# Patient Record
Sex: Female | Born: 1970 | Race: Black or African American | Hispanic: No | Smoking: Current every day smoker
Health system: Southern US, Community
[De-identification: ages and names within clinical notes are randomized; demographics above are authoritative.]

## PROBLEM LIST (undated history)

## (undated) DIAGNOSIS — R519 Headache, unspecified: Secondary | ICD-10-CM

## (undated) DIAGNOSIS — F419 Anxiety disorder, unspecified: Secondary | ICD-10-CM

## (undated) DIAGNOSIS — R000 Tachycardia, unspecified: Secondary | ICD-10-CM

## (undated) DIAGNOSIS — J189 Pneumonia, unspecified organism: Secondary | ICD-10-CM

## (undated) DIAGNOSIS — K219 Gastro-esophageal reflux disease without esophagitis: Secondary | ICD-10-CM

## (undated) DIAGNOSIS — M1990 Unspecified osteoarthritis, unspecified site: Secondary | ICD-10-CM

## (undated) DIAGNOSIS — I10 Essential (primary) hypertension: Secondary | ICD-10-CM

## (undated) HISTORY — PX: SKIN BIOPSY: SHX1

---

## 2012-08-05 HISTORY — PX: LIPOMA RESECTION: SHX23

## 2013-06-07 HISTORY — PX: BREAST BIOPSY: SHX20

## 2015-10-03 ENCOUNTER — Emergency Department (HOSPITAL_COMMUNITY)
Admission: EM | Admit: 2015-10-03 | Discharge: 2015-10-03 | Disposition: A | Discharge status: Home / Self Care | Payer: Self-pay | Attending: Emergency Medicine | Admitting: Emergency Medicine

## 2015-10-03 ENCOUNTER — Encounter (HOSPITAL_COMMUNITY): Payer: Self-pay | Admitting: Emergency Medicine

## 2015-10-03 DIAGNOSIS — Z79899 Other long term (current) drug therapy: Secondary | ICD-10-CM | POA: Insufficient documentation

## 2015-10-03 DIAGNOSIS — Z760 Encounter for issue of repeat prescription: Secondary | ICD-10-CM | POA: Insufficient documentation

## 2015-10-03 DIAGNOSIS — Z87891 Personal history of nicotine dependence: Secondary | ICD-10-CM | POA: Insufficient documentation

## 2015-10-03 DIAGNOSIS — Z880 Allergy status to penicillin: Secondary | ICD-10-CM | POA: Insufficient documentation

## 2015-10-03 DIAGNOSIS — R040 Epistaxis: Secondary | ICD-10-CM | POA: Insufficient documentation

## 2015-10-03 DIAGNOSIS — I159 Secondary hypertension, unspecified: Secondary | ICD-10-CM | POA: Insufficient documentation

## 2015-10-03 DIAGNOSIS — Z8739 Personal history of other diseases of the musculoskeletal system and connective tissue: Secondary | ICD-10-CM | POA: Insufficient documentation

## 2015-10-03 HISTORY — DX: Essential (primary) hypertension: I10

## 2015-10-03 HISTORY — DX: Unspecified osteoarthritis, unspecified site: M19.90

## 2015-10-03 MED ORDER — LISINOPRIL 10 MG PO TABS: 10.0000 mg | ORAL_TABLET | Freq: Every day | ORAL | Status: DC

## 2015-10-03 MED ORDER — LISINOPRIL 10 MG PO TABS: 10.0000 mg | ORAL_TABLET | Freq: Two times a day (BID) | ORAL | Status: DC

## 2015-10-03 NOTE — ED Notes (Addendum)
Patient coming from home with c/o of high blood pressure where she checked it at [facility] last night and it was 184/111. Patient needs a refill on her Lisinopril.  Patient and husband are here from [facility].    In the ED, patient is alert and oriented with a BP of 137/86.

## 2015-10-03 NOTE — ED Provider Notes (Signed)
CSN: [id number]     Arrival date & time 10/03/15  0946 History   First MD Initiated Contact with Patient 10/03/15 [phone]     Chief Complaint  Patient presents with  . Hypertension  . Medication Refill     (Consider location/radiation/quality/duration/timing/severity/associated sxs/prior Treatment) HPI Comments: 45 year old female with history of rheumatoid arthritis, hypertension presents for hypertension. The patient reports that she recently moved here and is out of her medications. She is trying to establish with a primary care physician but has been unable to. She is usually on lisinopril 10 mg twice daily. She said last night she felt like her blood pressure was elevated injected [employer] and found it to be 184/111. She then woke up this morning and had had a bloody nose which is typical when her blood pressure is not controlled. She denies chest pain, shortness of breath, back pain, headache, focal neurologic deficits. She reports feeling much better now and is noted to have a currently normal blood pressure.   Past Medical History  Diagnosis Date  . Arthritis     Left knee  . Hypertension    Past Surgical History  Procedure Laterality Date  . Skin biopsy     No family history on file. Social History  Substance Use Topics  . Smoking status: Former Games developer  . Smokeless tobacco: None  . Alcohol Use: No   OB History    No data available     Review of Systems  Constitutional: Negative for fever, chills and fatigue.  HENT: Positive for nosebleeds. Negative for congestion, rhinorrhea, sinus pressure, sneezing and voice change.   Eyes: Negative for visual disturbance.  Respiratory: Negative for cough, chest tightness and wheezing.   Cardiovascular: Negative for chest pain and palpitations.  Gastrointestinal: Negative for vomiting, abdominal pain, diarrhea and constipation.  Genitourinary: Negative for dysuria, urgency and hematuria.  Musculoskeletal: Negative for myalgias and  back pain.  Skin: Negative for rash.  Neurological: Negative for dizziness, weakness and headaches.  Hematological: Does not bruise/bleed easily.      Allergies  Amoxicillin  Home Medications   Prior to Admission medications   Medication Sig Start Date End Date Taking? Authorizing Provider  buPROPion (WELLBUTRIN SR) 150 MG 12 hr tablet Take 150 mg by mouth 2 (two) times daily.   Yes Historical Provider, MD  lisinopril (PRINIVIL,ZESTRIL) 10 MG tablet Take 10 mg by mouth daily.   Yes Historical Provider, MD  omeprazole (PRILOSEC) 20 MG capsule Take 20 mg by mouth daily.   Yes Historical Provider, MD   BP 128/84 mmHg  Pulse 83  Temp(Src) 98 F (36.7 C) (Oral)  Resp 16  Ht 5\' 7"  (1.702 m)  Wt 260 lb (117.935 kg)  BMI 40.71 kg/m2  SpO2 100% Physical Exam  Constitutional: She is oriented to person, place, and time. She appears well-developed and well-nourished. No distress.  HENT:  Head: Normocephalic and atraumatic.  Right Ear: External ear normal.  Left Ear: External ear normal.  Nose: Nose normal.  Mouth/Throat: Oropharynx is clear and moist. No oropharyngeal exudate.  Prominent blood vessels over the septum bilaterally with mildly irritated mucosa without any active bleeding  Eyes: EOM are normal. Pupils are equal, round, and reactive to light.  Neck: Normal range of motion. Neck supple.  Cardiovascular: Normal rate, regular rhythm, normal heart sounds and intact distal pulses.   No murmur heard. Pulmonary/Chest: Effort normal. No respiratory distress. She has no wheezes. She has no rales.  Abdominal: Soft. She exhibits no distension.  There is no tenderness.  Musculoskeletal: Normal range of motion. She exhibits no edema or tenderness.  Neurological: She is alert and oriented to person, place, and time.  Skin: Skin is warm and dry. No rash noted. She is not diaphoretic.  Vitals reviewed.   ED Course  Procedures (including critical care time) Labs Review Labs Reviewed  - No data to display  Imaging Review No results found. I have personally reviewed and evaluated these images and lab results as part of my medical decision-making.   EKG Interpretation None      MDM  Patient was seen and evaluated in stable condition. Pressure controlled at this time. Patient asymptomatic. No epistaxis on examination. Outpatient follow-up with a primary care physician was provided for the patient. She was also given a prescription for a month supply of her lisinopril. Strict return precautions were given. Final diagnoses:  None    1. Hypertension, chronic 2. Medication refill    Leta Baptist, MD 10/03/15 1609

## 2015-10-03 NOTE — Discharge Instructions (Signed)
You were seen and evaluated regarding your high blood pressure and being out of your medications. You have been provided with a prescription for your lisinopril. Take as directed. Follow up outpatient at the primary care office visit that was scheduled for you through care management.  Hypertension Hypertension, commonly called high blood pressure, is when the force of blood pumping through your arteries is too strong. Your arteries are the blood vessels that carry blood from your heart throughout your body. A blood pressure reading consists of a higher number over a lower number, such as 110/72. The higher number (systolic) is the pressure inside your arteries when your heart pumps. The lower number (diastolic) is the pressure inside your arteries when your heart relaxes. Ideally you want your blood pressure below 120/80. Hypertension forces your heart to work harder to pump blood. Your arteries may become narrow or stiff. Having untreated or uncontrolled hypertension can cause heart attack, stroke, kidney disease, and other problems. RISK FACTORS Some risk factors for high blood pressure are controllable. Others are not.  Risk factors you cannot control include:   Race. You may be at higher risk if you are African American.  Age. Risk increases with age.  Gender. Men are at higher risk than women before age 33 years. After age 68, women are at higher risk than men. Risk factors you can control include:  Not getting enough exercise or physical activity.  Being overweight.  Getting too much fat, sugar, calories, or salt in your diet.  Drinking too much alcohol. SIGNS AND SYMPTOMS Hypertension does not usually cause signs or symptoms. Extremely high blood pressure (hypertensive crisis) may cause headache, anxiety, shortness of breath, and nosebleed. DIAGNOSIS To check if you have hypertension, your health care provider will measure your blood pressure while you are seated, with your arm held  at the level of your heart. It should be measured at least twice using the same arm. Certain conditions can cause a difference in blood pressure between your right and left arms. A blood pressure reading that is higher than normal on one occasion does not mean that you need treatment. If it is not clear whether you have high blood pressure, you may be asked to return on a different day to have your blood pressure checked again. Or, you may be asked to monitor your blood pressure at home for 1 or more weeks. TREATMENT Treating high blood pressure includes making lifestyle changes and possibly taking medicine. Living a healthy lifestyle can help lower high blood pressure. You may need to change some of your habits. Lifestyle changes may include:  Following the DASH diet. This diet is high in fruits, vegetables, and whole grains. It is low in salt, red meat, and added sugars.  Keep your sodium intake below 2,300 mg per day.  Getting at least 30-45 minutes of aerobic exercise at least 4 times per week.  Losing weight if necessary.  Not smoking.  Limiting alcoholic beverages.  Learning ways to reduce stress. Your health care provider may prescribe medicine if lifestyle changes are not enough to get your blood pressure under control, and if one of the following is true:  You are 88-9 years of age and your systolic blood pressure is above 140.  You are 70 years of age or older, and your systolic blood pressure is above 150.  Your diastolic blood pressure is above 90.  You have diabetes, and your systolic blood pressure is over 140 or your diastolic blood pressure  is over 90.  You have kidney disease and your blood pressure is above 140/90.  You have heart disease and your blood pressure is above 140/90. Your personal target blood pressure may vary depending on your medical conditions, your age, and other factors. HOME CARE INSTRUCTIONS  Have your blood pressure rechecked as directed by  your health care provider.   Take medicines only as directed by your health care provider. Follow the directions carefully. Blood pressure medicines must be taken as prescribed. The medicine does not work as well when you skip doses. Skipping doses also puts you at risk for problems.  Do not smoke.   Monitor your blood pressure at home as directed by your health care provider. SEEK MEDICAL CARE IF:   You think you are having a reaction to medicines taken.  You have recurrent headaches or feel dizzy.  You have swelling in your ankles.  You have trouble with your vision. SEEK IMMEDIATE MEDICAL CARE IF:  You develop a severe headache or confusion.  You have unusual weakness, numbness, or feel faint.  You have severe chest or abdominal pain.  You vomit repeatedly.  You have trouble breathing. MAKE SURE YOU:   Understand these instructions.  Will watch your condition.  Will get help right away if you are not doing well or get worse.   This information is not intended to replace advice given to you by your health care provider. Make sure you discuss any questions you have with your health care provider.   Document Released: 05/24/2005 Document Revised: 10/08/2014 Document Reviewed: 03/16/2013 Elsevier Interactive Patient Education [employer].

## 2015-10-10 ENCOUNTER — Ambulatory Visit: Payer: Self-pay | Admitting: Family Medicine

## 2015-10-22 ENCOUNTER — Encounter: Payer: Self-pay | Admitting: Family Medicine

## 2015-10-22 ENCOUNTER — Ambulatory Visit (INDEPENDENT_AMBULATORY_CARE_PROVIDER_SITE_OTHER): Payer: 59 | Admitting: Family Medicine

## 2015-10-22 VITALS — BP 134/78 | HR 97 | Temp 98.6°F | Resp 16 | Ht 68.0 in | Wt 274.0 lb

## 2015-10-22 DIAGNOSIS — Z1239 Encounter for other screening for malignant neoplasm of breast: Secondary | ICD-10-CM

## 2015-10-22 DIAGNOSIS — M79602 Pain in left arm: Secondary | ICD-10-CM

## 2015-10-22 DIAGNOSIS — Z23 Encounter for immunization: Secondary | ICD-10-CM | POA: Diagnosis not present

## 2015-10-22 DIAGNOSIS — M069 Rheumatoid arthritis, unspecified: Secondary | ICD-10-CM

## 2015-10-22 DIAGNOSIS — I10 Essential (primary) hypertension: Secondary | ICD-10-CM | POA: Diagnosis not present

## 2015-10-22 DIAGNOSIS — M25512 Pain in left shoulder: Secondary | ICD-10-CM

## 2015-10-22 DIAGNOSIS — K219 Gastro-esophageal reflux disease without esophagitis: Secondary | ICD-10-CM

## 2015-10-22 DIAGNOSIS — F17200 Nicotine dependence, unspecified, uncomplicated: Secondary | ICD-10-CM

## 2015-10-22 LAB — COMPLETE METABOLIC PANEL WITH GFR
ALBUMIN: 3.9 g/dL (ref 3.6–5.1)
ALK PHOS: 101 U/L (ref 33–115)
ALT: 8 U/L (ref 6–29)
AST: 12 U/L (ref 10–30)
BUN: 10 mg/dL (ref 7–25)
CALCIUM: 8.9 mg/dL (ref 8.6–10.2)
CO2: 24 mmol/L (ref 20–31)
Chloride: 104 mmol/L (ref 98–110)
Creat: 0.63 mg/dL (ref 0.50–1.10)
GFR, Est African American: >89 mL/min (ref >=60)
GLUCOSE: 78 mg/dL (ref 65–99)
POTASSIUM: 4.4 mmol/L (ref 3.5–5.3)
SODIUM: 137 mmol/L (ref 135–146)
Total Bilirubin: 0.2 mg/dL (ref 0.2–1.2)
Total Protein: 7.1 g/dL (ref 6.1–8.1)

## 2015-10-22 LAB — LIPID PANEL
CHOL/HDL RATIO: 2.6 ratio (ref <=5.0)
CHOLESTEROL: 148 mg/dL (ref 125–200)
HDL: 58 mg/dL (ref >=46)
LDL Cholesterol: 71 mg/dL (ref <130)
TRIGLYCERIDES: 96 mg/dL (ref <150)
VLDL: 19 mg/dL (ref <30)

## 2015-10-22 LAB — CBC WITH DIFFERENTIAL/PLATELET
Basophils Absolute: 50 cells/uL (ref 0–200)
Basophils Relative: 1 %
EOS PCT: 3 %
Eosinophils Absolute: 150 cells/uL (ref 15–500)
HCT: 39.4 % (ref 35.0–45.0)
Hemoglobin: 12.9 g/dL (ref 11.7–15.5)
LYMPHS PCT: 39 %
Lymphs Abs: 1950 cells/uL (ref 850–3900)
MCH: 29.2 pg (ref 27.0–33.0)
MCHC: 32.7 g/dL (ref 32.0–36.0)
MCV: 89.1 fL (ref 80.0–100.0)
MPV: 8.7 fL (ref 7.5–12.5)
Monocytes Absolute: 400 cells/uL (ref 200–950)
Monocytes Relative: 8 %
NEUTROS PCT: 49 %
Neutro Abs: 2450 cells/uL (ref 1500–7800)
Platelets: 341 10*3/uL (ref 140–400)
RBC: 4.42 MIL/uL (ref 3.80–5.10)
RDW: 14.3 % (ref 11.0–15.0)
WBC: 5 10*3/uL (ref 3.8–10.8)

## 2015-10-22 LAB — C-REACTIVE PROTEIN: CRP: 1.1 mg/dL — ABNORMAL HIGH (ref <0.60)

## 2015-10-22 LAB — RHEUMATOID FACTOR

## 2015-10-22 MED ORDER — BUPROPION HCL ER (SR) 150 MG PO TB12: 150.0000 mg | ORAL_TABLET | Freq: Two times a day (BID) | ORAL | Status: DC

## 2015-10-22 MED ORDER — LISINOPRIL 10 MG PO TABS: 10.0000 mg | ORAL_TABLET | Freq: Two times a day (BID) | ORAL | Status: DC

## 2015-10-22 MED ORDER — OMEPRAZOLE 20 MG PO CPDR: 20.0000 mg | DELAYED_RELEASE_CAPSULE | Freq: Every day | ORAL | Status: DC

## 2015-10-22 NOTE — Progress Notes (Signed)
Subjective:    Patient ID: Jennifer Forbes, female    DOB: Nov 01, 1970, 45 y.o.   MRN: [id number]  HPI  Jennifer Forbes, a 45 year old female with a history of rheumatoid arthritis and hypertension presents to establish care. She recently relocated to area from [place]. She says that she was a patient of Dr. Manley Mason, but has been lost to follow-up. She states that she has been out of anti-hypertensive medications. She was recently evaluated in the emergency department at [facility] after blood pressure was 184/111 in [employer].She is not exercising and is not adherent to low salt diet.  She does not check blood pressures at home. . Patient denies chest pain, dyspnea, fatigue, palpitations, syncope and tachypnea.  Cardiovascular risk factors include: obesity (BMI >= 30 kg/m2) and sedentary lifestyle.  Jennifer Forbes also has a history of rheumatoid arthritis.  Symptoms have been present for several years.  Symptoms include joint pain primarily to knees and are of moderate severity. Patient denies rash, fever, fatigue. Symptoms are made worse by: cold exposure, kneeling, movement, standing and walking.  Symptoms are helped by NSAIDs and heat therapy. She has never been unde the care of a rheumatologist.   Associated symptoms include joint pain and morning stiffness. Patient denies associated muscle weakness, nausea, new headache, nodules, oral ulcers, palpitations, pleurisy, polydypsia, polyuria, rashes/photosensitive, Raynaud's and seizures. She says that she had a fall 1 week ago at work and left knee/left shoulder pain has worsened. She rates pain at 6/10 described as aching and intermittent. She says that she has not been able to return to work due to increased pain and activity limitation.  Past Medical History  Diagnosis Date  . Arthritis     Left knee  . Hypertension    Immunization History  Administered Date(s) Administered  . Pneumococcal Polysaccharide-23 10/22/2015   Past  Surgical History  Procedure Laterality Date  . Skin biopsy    . Lipoma resection  march 2014    right shoulder    Allergies  Allergen Reactions  . Amoxicillin Rash   Social History   Social History  . Marital Status: Married    Spouse Name: N/A  . Number of Children: N/A  . Years of Education: N/A   Occupational History  . Not on file.   Social History Main Topics  . Smoking status: Former Games developer  . Smokeless tobacco: Not on file  . Alcohol Use: No  . Drug Use: No  . Sexual Activity: Not on file   Other Topics Concern  . Not on file   Social History Narrative    Review of Systems  Constitutional: Negative for fever and fatigue.  HENT: Negative.   Eyes: Negative for photophobia and visual disturbance.  Respiratory: Negative.   Cardiovascular: Negative for chest pain, palpitations and leg swelling.  Gastrointestinal: Negative.   Endocrine: Negative for polydipsia, polyphagia and polyuria.  Genitourinary: Negative.   Musculoskeletal: Positive for joint swelling (left knee) and arthralgias (left shoulder and left knee).  Skin: Negative.   Allergic/Immunologic: Negative.   Neurological: Negative.   Hematological: Negative.   Psychiatric/Behavioral: Negative.  Negative for suicidal ideas, behavioral problems, confusion and sleep disturbance.       Objective:   Physical Exam  Constitutional: She appears well-developed and well-nourished.  Morbid obesity  HENT:  Head: Normocephalic and atraumatic.  Right Ear: External ear normal.  Left Ear: External ear normal.  Mouth/Throat: Oropharynx is clear and moist.  Musculoskeletal:  Left shoulder: She exhibits decreased range of motion, tenderness, crepitus, pain and decreased strength (3/5). She exhibits no swelling and no deformity.       Left knee: She exhibits decreased range of motion and swelling. She exhibits no erythema and normal patellar mobility. Tenderness found.      BP 134/78 mmHg  Pulse 97   Temp(Src) 98.6 F (37 C) (Oral)  Resp 16  Ht 5\' 8"  (1.727 m)  Wt 274 lb (124.286 kg)  BMI 41.67 kg/m2  SpO2 100%  LMP 10/19/2015 Assessment & Plan:   1. Essential hypertension Blood pressure is at goal on Lisinopril, will continue at 10 mg. The patient is asked to make an attempt to improve diet and exercise patterns to aid in medical management of this problem. - lisinopril (PRINIVIL,ZESTRIL) 10 MG tablet; Take 1 tablet (10 mg total) by mouth 2 (two) times daily.  Dispense: 60 tablet; Refill: 5 - Urinalysis Dipstick - Lipid Panel  2. Rheumatoid arthritis involving left knee, unspecified rheumatoid factor presence (HCC)  - Sedimentation Rate - C-reactive protein - Rheumatoid factor  3. Left arm pain  - Sedimentation Rate - C-reactive protein - Rheumatoid factor  4. Left shoulder pain  - Sedimentation Rate - C-reactive protein - Rheumatoid factor  5. Morbid obesity, unspecified obesity type (HCC) Recommend a lowfat, low carbohydrate diet divided over 5-6 small meals, increase water intake to 6-8 glasses, and 150 minutes per week of cardiovascular exercise. Given written material pertaining to diet.   - Hemoglobin A1c - COMPLETE METABOLIC PANEL WITH GFR - CBC with Differential  6. Tobacco dependence Smoking cessation instruction/counseling given:  counseled patient on the dangers of tobacco use, advised patient to stop smoking, and reviewed strategies to maximize success - buPROPion (WELLBUTRIN SR) 150 MG 12 hr tablet; Take 1 tablet (150 mg total) by mouth 2 (two) times daily.  Dispense: 60 tablet; Refill: 0  7. Gastroesophageal reflux disease without esophagitis  - omeprazole (PRILOSEC) 20 MG capsule; Take 1 capsule (20 mg total) by mouth daily.  Dispense: 30 capsule; Refill: 2  8. Immunization due  - Pneumococcal polysaccharide vaccine 23-valent greater than or equal to 2yo subcutaneous/IM   Routine Health Maintenance:   Pap smear 1 year ago, normal per  patient Sent referral for screening mammogram Vaccinations up to date    RTC: 1 month for hypertension. Will follow up by phone with laboratory results    The patient was given clear instructions to go to ER or return to medical center if symptoms do not improve, worsen or new problems develop. The patient verbalized understanding. Will notify patient with laboratory results.

## 2015-10-22 NOTE — Patient Instructions (Addendum)
Recommend work-up at urgent care for potential worker's compensation [facility] [address] [place], [place] [phone])  Will follow up by phone with laboratory resultsExercising to Lose Weight Exercising can help you to lose weight. In order to lose weight through exercise, you need to do vigorous-intensity exercise. You can tell that you are exercising with vigorous intensity if you are breathing very hard and fast and cannot hold a conversation while exercising. Moderate-intensity exercise helps to maintain your current weight. You can tell that you are exercising at a moderate level if you have a higher heart rate and faster breathing, but you are still able to hold a conversation. HOW OFTEN SHOULD I EXERCISE? Choose an activity that you enjoy and set realistic goals. Your health care provider can help you to make an activity plan that works for you. Exercise regularly as directed by your health care provider. This may include:  Doing resistance training twice each week, such as:  Push-ups.  Sit-ups.  Lifting weights.  Using resistance bands.  Doing a given intensity of exercise for a given amount of time. Choose from these options:  150 minutes of moderate-intensity exercise every week.  75 minutes of vigorous-intensity exercise every week.  A mix of moderate-intensity and vigorous-intensity exercise every week. Children, pregnant women, people who are out of shape, people who are overweight, and older adults may need to consult a health care provider for individual recommendations. If you have any sort of medical condition, be sure to consult your health care provider before starting a new exercise program. WHAT ARE SOME ACTIVITIES THAT CAN HELP ME TO LOSE WEIGHT?   Walking at a rate of at least 4.5 miles an hour.  Jogging or running at a rate of 5 miles per hour.  Biking at a rate of at least 10 miles per hour.  Lap swimming.  Roller-skating or  in-line skating.  Cross-country skiing.  Vigorous competitive sports, such as football, basketball, and soccer.  Jumping rope.  Aerobic dancing. HOW CAN I BE MORE ACTIVE IN MY DAY-TO-DAY ACTIVITIES?  Use the stairs instead of the elevator.  Take a walk during your lunch break.  If you drive, park your car farther away from work or school.  If you take public transportation, get off one stop early and walk the rest of the way.  Make all of your phone calls while standing up and walking around.  Get up, stretch, and walk around every 30 minutes throughout the day. WHAT GUIDELINES SHOULD I FOLLOW WHILE EXERCISING?  Do not exercise so much that you hurt yourself, feel dizzy, or get very short of breath.  Consult your health care provider prior to starting a new exercise program.  Wear comfortable clothes and shoes with good support.  Drink plenty of water while you exercise to prevent dehydration or heat stroke. Body water is lost during exercise and must be replaced.  Work out until you breathe faster and your heart beats faster.   This information is not intended to replace advice given to you by your health care provider. Make sure you discuss any questions you have with your health care provider.   Document Released: 06/26/2010 Document Revised: 06/14/2014 Document Reviewed: 10/25/2013 Elsevier Interactive Patient Education 2016 Elsevier Inc. DASH Eating Plan DASH stands for "Dietary Approaches to Stop Hypertension." The DASH eating plan is a healthy eating plan that has been shown to reduce high blood pressure (hypertension). Additional health benefits may include reducing the risk of  type 2 diabetes mellitus, heart disease, and stroke. The DASH eating plan may also help with weight loss. WHAT DO I NEED TO KNOW ABOUT THE DASH EATING PLAN? For the DASH eating plan, you will follow these general guidelines:  Choose foods with a percent daily value for sodium of less than 5%  (as listed on the food label).  Use salt-free seasonings or herbs instead of table salt or sea salt.  Check with your health care provider or pharmacist before using salt substitutes.  Eat lower-sodium products, often labeled as "lower sodium" or "no salt added."  Eat fresh foods.  Eat more vegetables, fruits, and low-fat dairy products.  Choose whole grains. Look for the word "whole" as the first word in the ingredient list.  Choose fish and skinless chicken or [place] more often than red meat. Limit fish, poultry, and meat to 6 oz (170 g) each day.  Limit sweets, desserts, sugars, and sugary drinks.  Choose heart-healthy fats.  Limit cheese to 1 oz (28 g) per day.  Eat more home-cooked food and less restaurant, buffet, and fast food.  Limit fried foods.  Cook foods using methods other than frying.  Limit canned vegetables. If you do use them, rinse them well to decrease the sodium.  When eating at a restaurant, ask that your food be prepared with less salt, or no salt if possible. WHAT FOODS CAN I EAT? Seek help from a dietitian for individual calorie needs. Grains Whole grain or whole wheat bread. Brown rice. Whole grain or whole wheat pasta. Quinoa, bulgur, and whole grain cereals. Low-sodium cereals. Corn or whole wheat flour tortillas. Whole grain cornbread. Whole grain crackers. Low-sodium crackers. Vegetables Fresh or frozen vegetables (raw, steamed, roasted, or grilled). Low-sodium or reduced-sodium tomato and vegetable juices. Low-sodium or reduced-sodium tomato sauce and paste. Low-sodium or reduced-sodium canned vegetables.  Fruits All fresh, canned (in natural juice), or frozen fruits. Meat and Other Protein Products Ground beef (85% or leaner), grass-fed beef, or beef trimmed of fat. Skinless chicken or [place]. Ground chicken or [place]. Pork trimmed of fat. All fish and seafood. Eggs. Dried beans, peas, or lentils. Unsalted nuts and seeds. Unsalted canned  beans. Dairy Low-fat dairy products, such as skim or 1% milk, 2% or reduced-fat cheeses, low-fat ricotta or cottage cheese, or plain low-fat yogurt. Low-sodium or reduced-sodium cheeses. Fats and Oils Tub margarines without trans fats. Light or reduced-fat mayonnaise and salad dressings (reduced sodium). Avocado. Safflower, olive, or canola oils. Natural peanut or almond butter. Other Unsalted popcorn and pretzels. The items listed above may not be a complete list of recommended foods or beverages. Contact your dietitian for more options. WHAT FOODS ARE NOT RECOMMENDED? Grains White bread. White pasta. White rice. Refined cornbread. Bagels and croissants. Crackers that contain trans fat. Vegetables Creamed or fried vegetables. Vegetables in a cheese sauce. Regular canned vegetables. Regular canned tomato sauce and paste. Regular tomato and vegetable juices. Fruits Dried fruits. Canned fruit in light or heavy syrup. Fruit juice. Meat and Other Protein Products Fatty cuts of meat. Ribs, chicken wings, bacon, sausage, bologna, salami, chitterlings, fatback, hot dogs, bratwurst, and packaged luncheon meats. Salted nuts and seeds. Canned beans with salt. Dairy Whole or 2% milk, cream, half-and-half, and cream cheese. Whole-fat or sweetened yogurt. Full-fat cheeses or blue cheese. Nondairy creamers and whipped toppings. Processed cheese, cheese spreads, or cheese curds. Condiments Onion and garlic salt, seasoned salt, table salt, and sea salt. Canned and packaged gravies. Worcestershire sauce. Tartar sauce. Barbecue sauce.  Teriyaki sauce. Soy sauce, including reduced sodium. Steak sauce. Fish sauce. Oyster sauce. Cocktail sauce. Horseradish. Ketchup and mustard. Meat flavorings and tenderizers. Bouillon cubes. Hot sauce. Tabasco sauce. Marinades. Taco seasonings. Relishes. Fats and Oils Butter, stick margarine, lard, shortening, ghee, and bacon fat. Coconut, palm kernel, or palm oils. Regular salad  dressings. Other Pickles and olives. Salted popcorn and pretzels. The items listed above may not be a complete list of foods and beverages to avoid. Contact your dietitian for more information. WHERE CAN I FIND MORE INFORMATION? National Heart, Lung, and Blood Institute: [employer]   This information is not intended to replace advice given to you by your health care provider. Make sure you discuss any questions you have with your health care provider.   Document Released: 05/13/2011 Document Revised: 06/14/2014 Document Reviewed: 03/28/2013 Elsevier Interactive Patient Education 2016 [employer]. Food Choices for Gastroesophageal Reflux Disease, Adult When you have gastroesophageal reflux disease (GERD), the foods you eat and your eating habits are very important. Choosing the right foods can help ease the discomfort of GERD. WHAT GENERAL GUIDELINES DO I NEED TO FOLLOW?  Choose fruits, vegetables, whole grains, low-fat dairy products, and low-fat meat, fish, and poultry.  Limit fats such as oils, salad dressings, butter, nuts, and avocado.  Keep a food diary to identify foods that cause symptoms.  Avoid foods that cause reflux. These may be different for different people.  Eat frequent small meals instead of three large meals each day.  Eat your meals slowly, in a relaxed setting.  Limit fried foods.  Cook foods using methods other than frying.  Avoid drinking alcohol.  Avoid drinking large amounts of liquids with your meals.  Avoid bending over or lying down until 2-3 hours after eating. WHAT FOODS ARE NOT RECOMMENDED? The following are some foods and drinks that may worsen your symptoms: Vegetables Tomatoes. Tomato juice. Tomato and spaghetti sauce. Chili peppers. Onion and garlic. Horseradish. Fruits Oranges, grapefruit, and lemon (fruit and juice). Meats High-fat meats, fish, and poultry. This includes hot dogs, ribs, ham,  sausage, salami, and bacon. Dairy Whole milk and chocolate milk. Sour cream. Cream. Butter. Ice cream. Cream cheese.  Beverages Coffee and tea, with or without caffeine. Carbonated beverages or energy drinks. Condiments Hot sauce. Barbecue sauce.  Sweets/Desserts Chocolate and cocoa. Donuts. Peppermint and spearmint. Fats and Oils High-fat foods, including [place] fries and potato chips. Other Vinegar. Strong spices, such as black pepper, white pepper, red pepper, cayenne, curry powder, cloves, ginger, and chili powder. The items listed above may not be a complete list of foods and beverages to avoid. Contact your dietitian for more information.   This information is not intended to replace advice given to you by your health care provider. Make sure you discuss any questions you have with your health care provider.   Document Released: 05/24/2005 Document Revised: 06/14/2014 Document Reviewed: 03/28/2013 Elsevier Interactive Patient Education [employer]

## 2015-10-23 LAB — HEMOGLOBIN A1C
HEMOGLOBIN A1C: 5.4 % (ref <5.7)
Mean Plasma Glucose: 108 mg/dL

## 2015-10-23 LAB — SEDIMENTATION RATE: SED RATE: 15 mm/h (ref 0–20)

## 2015-10-24 ENCOUNTER — Other Ambulatory Visit: Payer: Self-pay | Admitting: Family Medicine

## 2015-10-24 DIAGNOSIS — I10 Essential (primary) hypertension: Secondary | ICD-10-CM | POA: Insufficient documentation

## 2015-10-24 DIAGNOSIS — F17200 Nicotine dependence, unspecified, uncomplicated: Secondary | ICD-10-CM | POA: Insufficient documentation

## 2015-10-24 DIAGNOSIS — Z1231 Encounter for screening mammogram for malignant neoplasm of breast: Secondary | ICD-10-CM

## 2015-10-24 DIAGNOSIS — K219 Gastro-esophageal reflux disease without esophagitis: Secondary | ICD-10-CM | POA: Insufficient documentation

## 2015-10-24 DIAGNOSIS — M069 Rheumatoid arthritis, unspecified: Secondary | ICD-10-CM | POA: Insufficient documentation

## 2015-11-27 ENCOUNTER — Ambulatory Visit
Admission: RE | Admit: 2015-11-27 | Discharge: 2015-11-27 | Disposition: A | Discharge status: Home / Self Care | Payer: No Typology Code available for payment source | Source: Ambulatory Visit | Attending: Family Medicine | Admitting: Family Medicine

## 2015-11-27 DIAGNOSIS — Z1231 Encounter for screening mammogram for malignant neoplasm of breast: Secondary | ICD-10-CM

## 2015-12-01 ENCOUNTER — Other Ambulatory Visit: Payer: Self-pay | Admitting: Family Medicine

## 2015-12-01 DIAGNOSIS — R928 Other abnormal and inconclusive findings on diagnostic imaging of breast: Secondary | ICD-10-CM

## 2015-12-08 ENCOUNTER — Other Ambulatory Visit (HOSPITAL_COMMUNITY): Payer: Self-pay | Admitting: *Deleted

## 2015-12-08 ENCOUNTER — Encounter (HOSPITAL_COMMUNITY): Payer: Self-pay | Admitting: *Deleted

## 2015-12-08 DIAGNOSIS — R928 Other abnormal and inconclusive findings on diagnostic imaging of breast: Secondary | ICD-10-CM

## 2015-12-17 ENCOUNTER — Telehealth (HOSPITAL_COMMUNITY): Payer: Self-pay | Admitting: *Deleted

## 2015-12-17 NOTE — Telephone Encounter (Signed)
Telephoned patient at home # and left message about BCCCP appointment on July 13 2:00

## 2015-12-18 ENCOUNTER — Ambulatory Visit
Admission: RE | Admit: 2015-12-18 | Discharge: 2015-12-18 | Disposition: A | Discharge status: Home / Self Care | Payer: No Typology Code available for payment source | Source: Ambulatory Visit | Attending: Family Medicine | Admitting: Family Medicine

## 2015-12-18 ENCOUNTER — Encounter (HOSPITAL_COMMUNITY): Payer: Self-pay

## 2015-12-18 ENCOUNTER — Ambulatory Visit (HOSPITAL_COMMUNITY)
Admission: RE | Admit: 2015-12-18 | Discharge: 2015-12-18 | Disposition: A | Discharge status: Home / Self Care | Payer: Self-pay | Source: Ambulatory Visit | Attending: Obstetrics and Gynecology | Admitting: Obstetrics and Gynecology

## 2015-12-18 ENCOUNTER — Other Ambulatory Visit (HOSPITAL_COMMUNITY): Payer: Self-pay | Admitting: Obstetrics and Gynecology

## 2015-12-18 VITALS — BP 114/72 | Temp 98.9°F | Ht 68.0 in | Wt 270.8 lb

## 2015-12-18 DIAGNOSIS — Z1239 Encounter for other screening for malignant neoplasm of breast: Secondary | ICD-10-CM

## 2015-12-18 DIAGNOSIS — R928 Other abnormal and inconclusive findings on diagnostic imaging of breast: Secondary | ICD-10-CM

## 2015-12-18 DIAGNOSIS — R921 Mammographic calcification found on diagnostic imaging of breast: Secondary | ICD-10-CM

## 2015-12-18 NOTE — Progress Notes (Signed)
Patient referred to [facility] by the [facility] due to recommending additional imaging of bilateral breasts.Screening mammogram completed 11/27/2015.  Pap Smear:  Pap smear not completed today. Last Pap smear was in June 2016 in [place] and normal per patient. Per patient has a history of an abnormal Pap smear 20 years ago that a repeat Pap smear was completed for follow up that was normal. No Pap smear results are in EPIC.  Physical exam: Breasts Breasts symmetrical. No skin abnormalities bilateral breasts. No nipple retraction bilateral breasts. No nipple discharge bilateral breasts. No lymphadenopathy. No lumps palpated bilateral breasts. No complaints of pain or tenderness on exam. Referred patient to the [facility] for a bilateral diagnostic mammogram and breast ultrasounds per recommendation. Appointment scheduled for Thursday, December 18, 2015 at 1510.       Pelvic/Bimanual No Pap smear completed today since last Pap smear was in June 2016 per patient. Pap smear not indicated per BCCCP guidelines.   Smoking History: Patient has never smoked.  Patient Navigation: Patient education provided. Access to services provided for patient through [facility] program.

## 2015-12-18 NOTE — Patient Instructions (Signed)
Educational materials on self breast awareness given. Explained to Jennifer Forbes that she did not need a Pap smear today due to last Pap smear was in June 2016 per patient. Let her know [facility] will cover Pap smears every 3 years unless has a history of abnormal Pap smears. Referred patient to the [facility] for a bilateral diagnostic mammogram and breast ultrasounds per recommendation. Appointment scheduled for Thursday, December 18, 2015 at 1510.  Jennifer Forbes verbalized understanding.  Dannya Pitkin, Kathaleen Maser, RN 2:19 PM

## 2015-12-19 ENCOUNTER — Other Ambulatory Visit: Payer: Self-pay

## 2015-12-19 ENCOUNTER — Encounter (HOSPITAL_COMMUNITY): Payer: Self-pay | Admitting: *Deleted

## 2016-01-29 ENCOUNTER — Other Ambulatory Visit: Payer: Self-pay

## 2016-01-29 DIAGNOSIS — I10 Essential (primary) hypertension: Secondary | ICD-10-CM

## 2016-01-29 MED ORDER — LISINOPRIL 10 MG PO TABS: 10.0000 mg | ORAL_TABLET | Freq: Two times a day (BID) | ORAL | 1 refills | Status: DC

## 2016-01-29 NOTE — Telephone Encounter (Signed)
Refill for lisinopril has been sent in and visit has been scheduled. Thanks!

## 2016-03-02 ENCOUNTER — Ambulatory Visit (INDEPENDENT_AMBULATORY_CARE_PROVIDER_SITE_OTHER): Payer: Self-pay | Admitting: Family Medicine

## 2016-03-02 ENCOUNTER — Encounter: Payer: Self-pay | Admitting: Family Medicine

## 2016-03-02 VITALS — BP 140/107 | HR 92 | Temp 98.4°F | Resp 18 | Ht 68.0 in | Wt 268.0 lb

## 2016-03-02 DIAGNOSIS — J069 Acute upper respiratory infection, unspecified: Secondary | ICD-10-CM

## 2016-03-02 MED ORDER — GUAIFENESIN-CODEINE 100-10 MG/5ML PO SOLN: 5.0000 mL | Freq: Three times a day (TID) | ORAL | 0 refills | Status: DC | PRN

## 2016-03-02 NOTE — Progress Notes (Signed)
Patient is here for HTN FU  Patient has not taken medication today and patient has not eaten today.  Patient complains of arthritic pain increasing in the left knee. Patient complains of a knot being present for the past 3 days.  Patient complains of cold symptoms being present since Sunday. Patient has taken alka-selser and has been provided minimal relief.

## 2016-03-02 NOTE — Progress Notes (Signed)
Jennifer Forbes, is a 45 y.o. female  IRW:[id number]  PYP:[id number]  DOB - 11-26-1970  CC:  Chief Complaint  Patient presents with  . Hypertension       HPI: Jennifer Forbes is a 45 y.o. female here for follow-up hypertension. However, she has not taken her BP medication medication today. We are turning visit into sick visit and she will return for BP check next week,having taken her BP med.  Her complaint today is of cold symptoms for several days. Her husband had similar symptoms and is improving. She complains mostly of nasal congestion and cough. She reports having difficulty sleeping due to her stuffed up nose. She also mentions her chronic knee pain. She declines immunizations today. She reports smoking 2-3 cigarettes a day.  Allergies  Allergen Reactions  . Amoxicillin Rash   Past Medical History:  Diagnosis Date  . Arthritis    Left knee  . Hypertension    Current Outpatient Prescriptions on File Prior to Visit  Medication Sig Dispense Refill  . buPROPion (WELLBUTRIN SR) 150 MG 12 hr tablet Take 1 tablet (150 mg total) by mouth 2 (two) times daily. 60 tablet 0  . lisinopril (PRINIVIL,ZESTRIL) 10 MG tablet Take 1 tablet (10 mg total) by mouth 2 (two) times daily. 60 tablet 1  . omeprazole (PRILOSEC) 20 MG capsule Take 1 capsule (20 mg total) by mouth daily. 30 capsule 2   No current facility-administered medications on file prior to visit.    Family History  Problem Relation Age of Onset  . Hypertension Mother   . Breast cancer Sister   . Diabetes Brother   . Breast cancer Maternal Aunt   . Diabetes Maternal Uncle    Social History   Social History  . Marital status: Married    Spouse name: N/A  . Number of children: N/A  . Years of education: N/A   Occupational History  . Not on file.   Social History Main Topics  . Smoking status: Former Games developer  . Smokeless tobacco: Not on file  . Alcohol use No  . Drug use: No  . Sexual activity: Yes    Birth  control/ protection: None   Other Topics Concern  . Not on file   Social History Narrative  . No narrative on file    Review of Systems: Constitutional: Positive for fatigue Skin: Negative HENT: Positive for nasal congestion Eyes: Negative  Neck: Negative Respiratory: Positive for cough Cardiovascular: Negative Gastrointestinal: Negative Genitourinary: Negative  Musculoskeletal: Positive for knee pain  Neurological: Negative for Hematological: Negative  Psychiatric/Behavioral: Negative    Objective:   Vitals:   03/02/16 0955  BP: (!) 140/107  Pulse: 92  Resp: 18  Temp: 98.4 F (36.9 C)    Physical Exam: Constitutional: Patient appears well-developed and well-nourished. No distress. HENT: Normocephalic, atraumatic, External right and left ear normal. Oropharynx is clear and moist. Nasal passages inflammed Eyes: Conjunctivae and EOM are normal. PERRLA, no scleral icterus. Neck: Normal ROM. Neck supple. No lymphadenopathy, No thyromegaly. CVS: RRR, S1/S2 +, no murmurs, no gallops, no rubs Pulmonary: Effort and breath sounds normal, no stridor, rhonchi, wheezes, rales.  Abdominal: Soft. Normoactive BS,, no distension, tenderness, rebound or guarding.  Musculoskeletal: Normal range of motion. No edema and no tenderness.  Neuro: Alert.Normal muscle tone coordination. Non-focal Skin: Skin is warm and dry. No rash noted. Not diaphoretic. No erythema. No pallor. Psychiatric: Normal mood and affect. Behavior, judgment, thought content normal.  Lab Results  Component Value  Date   WBC 5.0 10/22/2015   HGB 12.9 10/22/2015   HCT 39.4 10/22/2015   MCV 89.1 10/22/2015   PLT 341 10/22/2015   Lab Results  Component Value Date   CREATININE 0.63 10/22/2015   BUN 10 10/22/2015   NA 137 10/22/2015   K 4.4 10/22/2015   CL 104 10/22/2015   CO2 24 10/22/2015    Lab Results  Component Value Date   HGBA1C 5.4 10/22/2015   Lipid Panel     Component Value Date/Time   CHOL  148 10/22/2015 1031   TRIG 96 10/22/2015 1031   HDL 58 10/22/2015 1031   CHOLHDL 2.6 10/22/2015 1031   VLDL 19 10/22/2015 1031   LDLCALC 71 10/22/2015 1031       Assessment and plan:   1. Acute upper respiratory infection -guaifenesin with codiene, 120 cc, 1 tsp tid cough -Advised may use afrin nasal spray for no more than 5 days, twice a day    Return in about 1 week (around 03/09/2016) for Nurse visit for BP check..  The patient was given clear instructions to go to ER or return to medical center if symptoms don't improve, worsen or new problems develop. The patient verbalized understanding.    Henrietta Hoover FNP  03/02/2016, 11:16 AM

## 2016-03-02 NOTE — Patient Instructions (Signed)
Take your BP medication regularly and return in one week for BP check with nurse] Careful of salt in diet May use Afrin nasal spray for nasal stuffiness.

## 2016-03-08 ENCOUNTER — Other Ambulatory Visit: Payer: Self-pay

## 2016-03-08 DIAGNOSIS — I10 Essential (primary) hypertension: Secondary | ICD-10-CM

## 2016-03-08 MED ORDER — LISINOPRIL 10 MG PO TABS: 10.0000 mg | ORAL_TABLET | Freq: Two times a day (BID) | ORAL | 1 refills | Status: DC

## 2016-03-09 ENCOUNTER — Ambulatory Visit (INDEPENDENT_AMBULATORY_CARE_PROVIDER_SITE_OTHER): Payer: Self-pay | Admitting: *Deleted

## 2016-03-09 VITALS — BP 139/73 | HR 89 | Temp 98.4°F | Resp 18

## 2016-03-09 DIAGNOSIS — I10 Essential (primary) hypertension: Secondary | ICD-10-CM

## 2016-03-09 NOTE — Progress Notes (Signed)
Patient is here for BP check  Patient has taken medication today and patient has eaten today.

## 2016-03-09 NOTE — Patient Instructions (Signed)
Patient advised to continue with current medication.

## 2016-05-25 ENCOUNTER — Emergency Department (HOSPITAL_COMMUNITY)
Admission: EM | Admit: 2016-05-25 | Discharge: 2016-05-25 | Disposition: A | Discharge status: Home / Self Care | Payer: No Typology Code available for payment source | Attending: Emergency Medicine | Admitting: Emergency Medicine

## 2016-05-25 ENCOUNTER — Encounter (HOSPITAL_COMMUNITY): Payer: Self-pay

## 2016-05-25 DIAGNOSIS — Y939 Activity, unspecified: Secondary | ICD-10-CM | POA: Insufficient documentation

## 2016-05-25 DIAGNOSIS — Z87891 Personal history of nicotine dependence: Secondary | ICD-10-CM | POA: Insufficient documentation

## 2016-05-25 DIAGNOSIS — Y92410 Unspecified street and highway as the place of occurrence of the external cause: Secondary | ICD-10-CM | POA: Insufficient documentation

## 2016-05-25 DIAGNOSIS — M546 Pain in thoracic spine: Secondary | ICD-10-CM | POA: Insufficient documentation

## 2016-05-25 DIAGNOSIS — Z79899 Other long term (current) drug therapy: Secondary | ICD-10-CM | POA: Insufficient documentation

## 2016-05-25 DIAGNOSIS — Y999 Unspecified external cause status: Secondary | ICD-10-CM | POA: Insufficient documentation

## 2016-05-25 DIAGNOSIS — I10 Essential (primary) hypertension: Secondary | ICD-10-CM | POA: Insufficient documentation

## 2016-05-25 DIAGNOSIS — S161XXA Strain of muscle, fascia and tendon at neck level, initial encounter: Secondary | ICD-10-CM | POA: Insufficient documentation

## 2016-05-25 MED ORDER — KETOROLAC TROMETHAMINE 15 MG/ML IJ SOLN
15.0000 mg | Freq: Once | INTRAMUSCULAR | Status: AC
Start: 2016-05-25 — End: 2016-05-25
  Administered 2016-05-25: 15 mg via INTRAMUSCULAR
  Filled 2016-05-25: qty 1

## 2016-05-25 MED ORDER — IBUPROFEN 600 MG PO TABS: 600.0000 mg | ORAL_TABLET | Freq: Four times a day (QID) | ORAL | 0 refills | Status: DC | PRN

## 2016-05-25 MED ORDER — IBUPROFEN 400 MG PO TABS: 600.0000 mg | ORAL_TABLET | Freq: Once | ORAL | Status: DC

## 2016-05-25 MED ORDER — METHOCARBAMOL 750 MG PO TABS: 750.0000 mg | ORAL_TABLET | Freq: Four times a day (QID) | ORAL | 0 refills | Status: DC | PRN
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
## 2016-05-25 NOTE — Discharge Instructions (Signed)
Return to ER if you experience fevers,chills,dizziness, vision or gait changes, headaches, chest pain, shortness of breath, abdominal pain, nausea/vomiting, bladder or bowel incontinence, numbness to groin, extremity numbness/tingling/weakness, worsening symptoms,or any additional concerns. Call to schedule a follow up appointment with your primary care doctor. Take Ibuprofen and Robaxin as prescribed.Take Ibuprofen with food to prevent GI upset. Caution Robaxin may cause sedation--do not drink alcohol, drive, or operate machinery while taking.
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
## 2016-05-30 ENCOUNTER — Other Ambulatory Visit: Payer: Self-pay | Admitting: Family Medicine

## 2016-05-30 DIAGNOSIS — I10 Essential (primary) hypertension: Secondary | ICD-10-CM

## 2016-06-14 ENCOUNTER — Encounter (HOSPITAL_COMMUNITY): Payer: Self-pay | Admitting: Emergency Medicine

## 2016-06-14 DIAGNOSIS — S39012A Strain of muscle, fascia and tendon of lower back, initial encounter: Secondary | ICD-10-CM | POA: Diagnosis not present

## 2016-06-14 DIAGNOSIS — Y939 Activity, unspecified: Secondary | ICD-10-CM | POA: Diagnosis not present

## 2016-06-14 DIAGNOSIS — N939 Abnormal uterine and vaginal bleeding, unspecified: Secondary | ICD-10-CM | POA: Insufficient documentation

## 2016-06-14 DIAGNOSIS — I10 Essential (primary) hypertension: Secondary | ICD-10-CM | POA: Insufficient documentation

## 2016-06-14 DIAGNOSIS — S3992XA Unspecified injury of lower back, initial encounter: Secondary | ICD-10-CM | POA: Diagnosis present

## 2016-06-14 DIAGNOSIS — N393 Stress incontinence (female) (male): Secondary | ICD-10-CM | POA: Insufficient documentation

## 2016-06-14 DIAGNOSIS — Z87891 Personal history of nicotine dependence: Secondary | ICD-10-CM | POA: Insufficient documentation

## 2016-06-14 DIAGNOSIS — Y999 Unspecified external cause status: Secondary | ICD-10-CM | POA: Insufficient documentation

## 2016-06-14 DIAGNOSIS — Y92410 Unspecified street and highway as the place of occurrence of the external cause: Secondary | ICD-10-CM | POA: Insufficient documentation

## 2016-06-14 DIAGNOSIS — Z7982 Long term (current) use of aspirin: Secondary | ICD-10-CM | POA: Diagnosis not present

## 2016-06-14 LAB — CBC WITH DIFFERENTIAL/PLATELET
BASOS ABS: 0 10*3/uL (ref 0.0–0.1)
Basophils Relative: 1 %
Eosinophils Absolute: 0.2 10*3/uL (ref 0.0–0.7)
Eosinophils Relative: 3 %
HCT: 39.2 % (ref 36.0–46.0)
Hemoglobin: 12.7 g/dL (ref 12.0–15.0)
LYMPHS PCT: 50 %
Lymphs Abs: 2.9 10*3/uL (ref 0.7–4.0)
MCH: 28.9 pg (ref 26.0–34.0)
MCHC: 32.4 g/dL (ref 30.0–36.0)
MCV: 89.1 fL (ref 78.0–100.0)
MONO ABS: 0.5 10*3/uL (ref 0.1–1.0)
Monocytes Relative: 8 %
Neutro Abs: 2.1 10*3/uL (ref 1.7–7.7)
Neutrophils Relative %: 38 %
Platelets: 358 10*3/uL (ref 150–400)
RBC: 4.4 MIL/uL (ref 3.87–5.11)
RDW: 14.9 % (ref 11.5–15.5)
WBC: 5.7 10*3/uL (ref 4.0–10.5)

## 2016-06-14 LAB — URINALYSIS, ROUTINE W REFLEX MICROSCOPIC
BILIRUBIN URINE: NEGATIVE
Bacteria, UA: NONE SEEN
Glucose, UA: NEGATIVE mg/dL
Ketones, ur: 5 mg/dL — AB
LEUKOCYTES UA: NEGATIVE
NITRITE: NEGATIVE
Protein, ur: NEGATIVE mg/dL
SPECIFIC GRAVITY, URINE: 1.032 — AB (ref 1.005–1.030)
pH: 5 (ref 5.0–8.0)

## 2016-06-14 LAB — COMPREHENSIVE METABOLIC PANEL
ALT: 10 U/L — ABNORMAL LOW (ref 14–54)
AST: 14 U/L — AB (ref 15–41)
Albumin: 3.5 g/dL (ref 3.5–5.0)
Alkaline Phosphatase: 78 U/L (ref 38–126)
Anion gap: 7 (ref 5–15)
BUN: 12 mg/dL (ref 6–20)
CHLORIDE: 107 mmol/L (ref 101–111)
CO2: 24 mmol/L (ref 22–32)
Calcium: 9.1 mg/dL (ref 8.9–10.3)
Creatinine, Ser: 0.76 mg/dL (ref 0.44–1.00)
Glucose, Bld: 84 mg/dL (ref 65–99)
POTASSIUM: 4 mmol/L (ref 3.5–5.1)
Sodium: 138 mmol/L (ref 135–145)
Total Bilirubin: 0.2 mg/dL — ABNORMAL LOW (ref 0.3–1.2)
Total Protein: 6.9 g/dL (ref 6.5–8.1)

## 2016-06-14 LAB — SAMPLE TO BLOOD BANK

## 2016-06-14 LAB — I-STAT BETA HCG BLOOD, ED (MC, WL, AP ONLY)
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
## 2016-06-15 ENCOUNTER — Emergency Department (HOSPITAL_COMMUNITY): Payer: No Typology Code available for payment source

## 2016-06-15 ENCOUNTER — Emergency Department (HOSPITAL_COMMUNITY)
Admission: EM | Admit: 2016-06-15 | Discharge: 2016-06-15 | Disposition: A | Discharge status: Home / Self Care | Payer: No Typology Code available for payment source | Attending: Emergency Medicine | Admitting: Emergency Medicine

## 2016-06-15 ENCOUNTER — Encounter (HOSPITAL_COMMUNITY): Payer: Self-pay | Admitting: *Deleted

## 2016-06-15 DIAGNOSIS — S39012A Strain of muscle, fascia and tendon of lower back, initial encounter: Secondary | ICD-10-CM

## 2016-06-15 DIAGNOSIS — N393 Stress incontinence (female) (male): Secondary | ICD-10-CM

## 2016-06-15 DIAGNOSIS — N939 Abnormal uterine and vaginal bleeding, unspecified: Secondary | ICD-10-CM

## 2016-06-15 DIAGNOSIS — R32 Unspecified urinary incontinence: Secondary | ICD-10-CM

## 2016-06-15 MED ORDER — HYDROCODONE-ACETAMINOPHEN 5-325 MG PO TABS: ORAL_TABLET | ORAL | 0 refills | Status: DC

## 2016-06-15 MED ORDER — OXYCODONE-ACETAMINOPHEN 5-325 MG PO TABS
1.0000 | ORAL_TABLET | Freq: Once | ORAL | Status: AC
  Administered 2016-06-15: 1 via ORAL
  Filled 2016-06-15: qty 1

## 2016-06-15 NOTE — ED Notes (Signed)
Patient transported to MRI 

## 2016-06-15 NOTE — ED Provider Notes (Signed)
PROGRESS NOTE                                                                                                                 This is a sign-out from PA upstillat shift change: Jennifer Forbes is a 46 y.o. female presenting with vaginal bleeding, pelvic exam unremarkable. No signs of infection. Patient is also reporting worsening urinary incontinence, formally stress incontinence but now she has to wear diapers. Given the history of lumbar radicular pain and incontinence, MRI is ordered. Please refer to previous note for full HPI, ROS, PMH and PE.   MRI with no significant abnormality. Reviewed the read with attending. Patient states pain is improved with Percocet. Updated patient, advised her she will need to follow with OB/GYN for both the stress incontinence and vaginal bleeding, [facility]. She has an appointment with her primary care physician within the week, advised her to please update the PCP that she has had multiple ED visits since the last check in. Short course of Vicodin given.    Wynetta Emery, PA-C 06/15/16 0786    Gilda Crease, MD 06/15/16 [phone]

## 2016-06-15 NOTE — Discharge Instructions (Signed)
For pain control please take ibuprofen (also known as Motrin or Advil) 800mg (this is normally 4 over the counter pills) 3 times a day  for 5 days. Take with food to minimize stomach irritation. ° °Take vicodin for breakthrough pain, do not drink alcohol, drive, care for children or do other critical tasks while taking vicodin. ° °Please follow with your primary care doctor in the next 2 days for a check-up. They must obtain records for further management.  ° °Do not hesitate to return to the Emergency Department for any new, worsening or concerning symptoms.  ° ° °
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
## 2016-06-22 ENCOUNTER — Encounter: Payer: Self-pay | Admitting: Family Medicine

## 2016-06-22 ENCOUNTER — Ambulatory Visit (INDEPENDENT_AMBULATORY_CARE_PROVIDER_SITE_OTHER): Payer: Self-pay | Admitting: Family Medicine

## 2016-06-22 VITALS — BP 140/82 | HR 89 | Temp 98.2°F | Resp 16 | Ht 68.0 in | Wt 270.0 lb

## 2016-06-22 DIAGNOSIS — Z23 Encounter for immunization: Secondary | ICD-10-CM

## 2016-06-22 DIAGNOSIS — I10 Essential (primary) hypertension: Secondary | ICD-10-CM

## 2016-06-22 DIAGNOSIS — M5442 Lumbago with sciatica, left side: Secondary | ICD-10-CM

## 2016-06-22 DIAGNOSIS — S3992XD Unspecified injury of lower back, subsequent encounter: Secondary | ICD-10-CM

## 2016-06-22 DIAGNOSIS — F17200 Nicotine dependence, unspecified, uncomplicated: Secondary | ICD-10-CM

## 2016-06-22 LAB — POCT URINALYSIS DIP (DEVICE)
BILIRUBIN URINE: NEGATIVE
GLUCOSE, UA: NEGATIVE mg/dL
Hgb urine dipstick: NEGATIVE
KETONES UR: NEGATIVE mg/dL
Leukocytes, UA: NEGATIVE
Nitrite: NEGATIVE
PROTEIN: NEGATIVE mg/dL
SPECIFIC GRAVITY, URINE: 1.025 (ref 1.005–1.030)
Urobilinogen, UA: 0.2 mg/dL (ref 0.0–1.0)
pH: 6 (ref 5.0–8.0)

## 2016-06-22 MED ORDER — KETOROLAC TROMETHAMINE 60 MG/2ML IM SOLN
60.0000 mg | Freq: Once | INTRAMUSCULAR | Status: AC
  Administered 2016-06-22: 60 mg via INTRAMUSCULAR

## 2016-06-22 MED ORDER — GABAPENTIN 300 MG PO CAPS: 300.0000 mg | ORAL_CAPSULE | Freq: Three times a day (TID) | ORAL | 1 refills | Status: DC
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
## 2016-06-23 MED ORDER — LISINOPRIL 20 MG PO TABS: 20.0000 mg | ORAL_TABLET | Freq: Every day | ORAL | 5 refills | Status: DC

## 2016-06-28 ENCOUNTER — Telehealth: Payer: Self-pay

## 2016-06-28 NOTE — Telephone Encounter (Signed)
Called and explained referral process and advised that we are working on referral. Thanks!

## 2016-07-23 ENCOUNTER — Ambulatory Visit: Payer: No Typology Code available for payment source | Admitting: Family Medicine

## 2016-08-14 ENCOUNTER — Other Ambulatory Visit: Payer: Self-pay | Admitting: Family Medicine

## 2016-09-10 ENCOUNTER — Other Ambulatory Visit: Payer: Self-pay

## 2016-09-10 MED ORDER — LISINOPRIL 10 MG PO TABS: 10.0000 mg | ORAL_TABLET | Freq: Two times a day (BID) | ORAL | 1 refills | Status: DC

## 2016-11-02 ENCOUNTER — Other Ambulatory Visit: Payer: Self-pay | Admitting: Family Medicine

## 2016-11-02 DIAGNOSIS — Z1231 Encounter for screening mammogram for malignant neoplasm of breast: Secondary | ICD-10-CM

## 2016-11-11 ENCOUNTER — Ambulatory Visit: Payer: No Typology Code available for payment source | Admitting: Family Medicine

## 2016-11-30 ENCOUNTER — Ambulatory Visit: Payer: No Typology Code available for payment source

## 2017-02-10 ENCOUNTER — Ambulatory Visit: Payer: No Typology Code available for payment source

## 2017-03-22 ENCOUNTER — Other Ambulatory Visit: Payer: Self-pay | Admitting: Family Medicine

## 2017-03-23 ENCOUNTER — Other Ambulatory Visit: Payer: Self-pay | Admitting: Family Medicine

## 2017-03-23 DIAGNOSIS — M5442 Lumbago with sciatica, left side: Secondary | ICD-10-CM

## 2017-03-28 ENCOUNTER — Other Ambulatory Visit: Payer: Self-pay | Admitting: Family Medicine

## 2017-04-30 ENCOUNTER — Other Ambulatory Visit: Payer: Self-pay | Admitting: Family Medicine

## 2017-04-30 DIAGNOSIS — I10 Essential (primary) hypertension: Secondary | ICD-10-CM

## 2017-05-06 ENCOUNTER — Ambulatory Visit (INDEPENDENT_AMBULATORY_CARE_PROVIDER_SITE_OTHER): Payer: Self-pay | Admitting: Family Medicine

## 2017-05-06 ENCOUNTER — Encounter: Payer: Self-pay | Admitting: Family Medicine

## 2017-05-06 VITALS — BP 131/94 | HR 108 | Temp 98.3°F | Resp 16 | Ht 69.0 in | Wt 269.0 lb

## 2017-05-06 DIAGNOSIS — Z131 Encounter for screening for diabetes mellitus: Secondary | ICD-10-CM

## 2017-05-06 DIAGNOSIS — I10 Essential (primary) hypertension: Secondary | ICD-10-CM

## 2017-05-06 DIAGNOSIS — R42 Dizziness and giddiness: Secondary | ICD-10-CM

## 2017-05-06 DIAGNOSIS — R9431 Abnormal electrocardiogram [ECG] [EKG]: Secondary | ICD-10-CM

## 2017-05-06 DIAGNOSIS — R000 Tachycardia, unspecified: Secondary | ICD-10-CM

## 2017-05-06 LAB — COMPLETE METABOLIC PANEL WITH GFR
AG Ratio: 1.2 (calc) (ref 1.0–2.5)
ALBUMIN MSPROF: 4.1 g/dL (ref 3.6–5.1)
ALKALINE PHOSPHATASE (APISO): 109 U/L (ref 33–115)
ALT: 8 U/L (ref 6–29)
AST: 13 U/L (ref 10–30)
BILIRUBIN TOTAL: 0.2 mg/dL (ref 0.2–1.2)
BUN: 19 mg/dL (ref 7–25)
CHLORIDE: 103 mmol/L (ref 98–110)
CO2: 26 mmol/L (ref 20–32)
Calcium: 9.7 mg/dL (ref 8.6–10.2)
Creat: 0.62 mg/dL (ref 0.50–1.10)
GFR, Est African American: 127 mL/min/{1.73_m2} (ref >=60)
GFR, Est Non African American: 110 mL/min/{1.73_m2} (ref >=60)
GLUCOSE: 82 mg/dL (ref 65–99)
Globulin: 3.5 g/dL (calc) (ref 1.9–3.7)
Potassium: 4.7 mmol/L (ref 3.5–5.3)
SODIUM: 137 mmol/L (ref 135–146)
Total Protein: 7.6 g/dL (ref 6.1–8.1)

## 2017-05-06 LAB — POCT URINALYSIS DIP (DEVICE)
Glucose, UA: NEGATIVE mg/dL
Hgb urine dipstick: NEGATIVE
Leukocytes, UA: NEGATIVE
NITRITE: NEGATIVE
PH: 5.5 (ref 5.0–8.0)
PROTEIN: 30 mg/dL — AB
Specific Gravity, Urine: 1.025 (ref 1.005–1.030)
UROBILINOGEN UA: 1 mg/dL (ref 0.0–1.0)

## 2017-05-06 LAB — POCT GLYCOSYLATED HEMOGLOBIN (HGB A1C): HEMOGLOBIN A1C: 5.3

## 2017-05-06 MED ORDER — AMLODIPINE BESYLATE 5 MG PO TABS: 5.0000 mg | ORAL_TABLET | Freq: Every day | ORAL | 1 refills | Status: DC

## 2017-05-06 NOTE — Progress Notes (Signed)
ba1c

## 2017-05-06 NOTE — Progress Notes (Signed)
Subjective:    Patient ID: Jennifer Forbes, female    DOB: 10/25/1970, 46 y.o.   MRN: [id number]  HPI  Jennifer Forbes, a 46 year old female with a history of hypertension and morbid obesity presents for a follow up of hypertension. Patient states that she has been out of medication over the past week. Patient was previously taking Lisinopril 10 mg consistently. Patient reports that she has been under a great deal of stress over the past several months . She says that she recently received custody of her twin granddaughters. She has not been following a lowfat, low salt diet or exercising routinely. Cardiac risk factors include obesity and tobacco dependence. Current body mass index is 39.72.  Patient smokes 1 pack of cigarettes per day, she has attempted to quit in the past without success.  Patient endorses dizziness.  She denies shortness of breath, chest pain, heart palpitations, syncope, or bilateral lower extremity edema. Past Medical History:  Diagnosis Date  . Arthritis    Left knee  . Hypertension    Social History   Socioeconomic History  . Marital status: Married    Spouse name: Not on file  . Number of children: Not on file  . Years of education: Not on file  . Highest education level: Not on file  Social Needs  . Financial resource strain: Not on file  . Food insecurity - worry: Not on file  . Food insecurity - inability: Not on file  . Transportation needs - medical: Not on file  . Transportation needs - non-medical: Not on file  Occupational History  . Not on file  Tobacco Use  . Smoking status: Current Every Day Smoker    Packs/day: 0.25    Types: Cigars  . Smokeless tobacco: Never Used  . Tobacco comment: black and mild   Substance and Sexual Activity  . Alcohol use: No  . Drug use: Yes    Types: Marijuana  . Sexual activity: Yes    Birth control/protection: None  Other Topics Concern  . Not on file  Social History Narrative  . Not on file   Immunization  History  Administered Date(s) Administered  . Pneumococcal Polysaccharide-23 10/22/2015  . Tdap 06/22/2016      Review of Systems  Constitutional: Positive for fatigue and unexpected weight change (weight gain).  HENT: Negative.   Respiratory: Negative.   Cardiovascular: Negative for chest pain, palpitations and leg swelling.  Gastrointestinal: Negative.   Endocrine: Negative.   Genitourinary: Negative.   Musculoskeletal: Negative.   Skin: Negative.   Allergic/Immunologic: Negative.   Neurological: Negative.   Hematological: Negative.   Psychiatric/Behavioral: Negative.        Objective:   Physical Exam  Constitutional: She is oriented to person, place, and time. She appears well-developed and well-nourished.  HENT:  Head: Normocephalic and atraumatic.  Right Ear: External ear normal.  Left Ear: External ear normal.  Nose: Nose normal.  Mouth/Throat: Oropharynx is clear and moist.  Eyes: Pupils are equal, round, and reactive to light.  Neck: Normal range of motion. Neck supple.  Cardiovascular: Regular rhythm, normal heart sounds and intact distal pulses. Tachycardia present.  Pulses:      Carotid pulses are 2+ on the right side, and 2+ on the left side.      Radial pulses are 2+ on the right side, and 2+ on the left side.       Femoral pulses are 2+ on the right side, and 2+ on  the left side.      Popliteal pulses are 2+ on the right side, and 2+ on the left side.       Dorsalis pedis pulses are 2+ on the right side, and 2+ on the left side.       Posterior tibial pulses are 2+ on the right side, and 2+ on the left side.  Pulmonary/Chest: Effort normal and breath sounds normal.  Abdominal: Soft. Bowel sounds are normal.  Abdominal obesity  Neurological: She is alert and oriented to person, place, and time.  Skin: Skin is warm and dry.  Psychiatric: She has a normal mood and affect. Her behavior is normal. Judgment and thought content normal.       BP (!) 131/94  (BP Location: Right Arm, Patient Position: Sitting, Cuff Size: Large)   Pulse (!) 108   Temp 98.3 F (36.8 C) (Oral)   Resp 16   Ht 5\' 9"  (1.753 m)   Wt 269 lb (122 kg)   LMP 04/21/2017   SpO2 98%   BMI 39.72 kg/m  Assessment & Plan:  1. Essential hypertension Will start a trial of Amlodipine 5 mg daily. Will discontinue Lisinopril. - Continue medication, monitor blood pressure at home. Continue DASH diet. Reminder to go to the ER if any CP, SOB, nausea, dizziness, severe HA, changes vision/speech, left arm numbness and tingling and jaw pain.    - Orthostatic vital signs - COMPLETE METABOLIC PANEL WITH GFR - amLODipine (NORVASC) 5 MG tablet; Take 1 tablet (5 mg total) by mouth daily.  Dispense: 30 tablet; Refill: 1 - HgB A1c - POCT urinalysis dip (device)  2. Dizziness Reviewed orthostatic vital signs, pulse increased on standing.  - EKG 12-Lead  3. Morbid obesity (HCC) Recommend a lowfat, low carbohydrate diet divided over 5-6 small meals, increase water intake to 6-8 glasses, and 150 minutes per week of cardiovascular exercise.   - HgB A1c  4. Tachycardia Reviewed EKG, sinus tachycardia  5. Screening for diabetes mellitus - HgB A1c  6. Abnormal EKG Patient warrants a referral to cardiology   RTC: 1 week for blood pressure check and 1 month for hypertension   04/23/2017  MSN, FNP-C [facility] [address] [place], [place] [phone]

## 2017-05-06 NOTE — Patient Instructions (Addendum)
Amlodipine 5 mg. Return in 1 week for blood pressure.     Hypertension Hypertension, commonly called high blood pressure, is when the force of blood pumping through the arteries is too strong. The arteries are the blood vessels that carry blood from the heart throughout the body. Hypertension forces the heart to work harder to pump blood and may cause arteries to become narrow or stiff. Having untreated or uncontrolled hypertension can cause heart attacks, strokes, kidney disease, and other problems. A blood pressure reading consists of a higher number over a lower number. Ideally, your blood pressure should be below 120/80. The first ("top") number is called the systolic pressure. It is a measure of the pressure in your arteries as your heart beats. The second ("bottom") number is called the diastolic pressure. It is a measure of the pressure in your arteries as the heart relaxes. What are the causes? The cause of this condition is not known. What increases the risk? Some risk factors for high blood pressure are under your control. Others are not. Factors you can change  Smoking.  Having type 2 diabetes mellitus, high cholesterol, or both.  Not getting enough exercise or physical activity.  Being overweight.  Having too much fat, sugar, calories, or salt (sodium) in your diet.  Drinking too much alcohol. Factors that are difficult or impossible to change  Having chronic kidney disease.  Having a family history of high blood pressure.  Age. Risk increases with age.  Race. You may be at higher risk if you are African-American.  Gender. Men are at higher risk than women before age 33. After age 6, women are at higher risk than men.  Having obstructive sleep apnea.  Stress. What are the signs or symptoms? Extremely high blood pressure (hypertensive crisis) may cause:  Headache.  Anxiety.  Shortness of breath.  Nosebleed.  Nausea and vomiting.  Severe chest  pain.  Jerky movements you cannot control (seizures).  How is this diagnosed? This condition is diagnosed by measuring your blood pressure while you are seated, with your arm resting on a surface. The cuff of the blood pressure monitor will be placed directly against the skin of your upper arm at the level of your heart. It should be measured at least twice using the same arm. Certain conditions can cause a difference in blood pressure between your right and left arms. Certain factors can cause blood pressure readings to be lower or higher than normal (elevated) for a short period of time:  When your blood pressure is higher when you are in a health care provider's office than when you are at home, this is called white coat hypertension. Most people with this condition do not need medicines.  When your blood pressure is higher at home than when you are in a health care provider's office, this is called masked hypertension. Most people with this condition may need medicines to control blood pressure.  If you have a high blood pressure reading during one visit or you have normal blood pressure with other risk factors:  You may be asked to return on a different day to have your blood pressure checked again.  You may be asked to monitor your blood pressure at home for 1 week or longer.  If you are diagnosed with hypertension, you may have other blood or imaging tests to help your health care provider understand your overall risk for other conditions. How is this treated? This condition is treated by making healthy lifestyle  changes, such as eating healthy foods, exercising more, and reducing your alcohol intake. Your health care provider may prescribe medicine if lifestyle changes are not enough to get your blood pressure under control, and if:  Your systolic blood pressure is above 130.  Your diastolic blood pressure is above 80.  Your personal target blood pressure may vary depending on your  medical conditions, your age, and other factors. Follow these instructions at home: Eating and drinking  Eat a diet that is high in fiber and potassium, and low in sodium, added sugar, and fat. An example eating plan is called the DASH (Dietary Approaches to Stop Hypertension) diet. To eat this way: ? Eat plenty of fresh fruits and vegetables. Try to fill half of your plate at each meal with fruits and vegetables. ? Eat whole grains, such as whole wheat pasta, brown rice, or whole grain bread. Fill about one quarter of your plate with whole grains. ? Eat or drink low-fat dairy products, such as skim milk or low-fat yogurt. ? Avoid fatty cuts of meat, processed or cured meats, and poultry with skin. Fill about one quarter of your plate with lean proteins, such as fish, chicken without skin, beans, eggs, and tofu. ? Avoid premade and processed foods. These tend to be higher in sodium, added sugar, and fat.  Reduce your daily sodium intake. Most people with hypertension should eat less than 1,500 mg of sodium a day.  Limit alcohol intake to no more than 1 drink a day for nonpregnant women and 2 drinks a day for men. One drink equals 12 oz of beer, 5 oz of wine, or 1 oz of hard liquor. Lifestyle  Work with your health care provider to maintain a healthy body weight or to lose weight. Ask what an ideal weight is for you.  Get at least 30 minutes of exercise that causes your heart to beat faster (aerobic exercise) most days of the week. Activities may include walking, swimming, or biking.  Include exercise to strengthen your muscles (resistance exercise), such as pilates or lifting weights, as part of your weekly exercise routine. Try to do these types of exercises for 30 minutes at least 3 days a week.  Do not use any products that contain nicotine or tobacco, such as cigarettes and e-cigarettes. If you need help quitting, ask your health care provider.  Monitor your blood pressure at home as  told by your health care provider.  Keep all follow-up visits as told by your health care provider. This is important. Medicines  Take over-the-counter and prescription medicines only as told by your health care provider. Follow directions carefully. Blood pressure medicines must be taken as prescribed.  Do not skip doses of blood pressure medicine. Doing this puts you at risk for problems and can make the medicine less effective.  Ask your health care provider about side effects or reactions to medicines that you should watch for. Contact a health care provider if:  You think you are having a reaction to a medicine you are taking.  You have headaches that keep coming back (recurring).  You feel dizzy.  You have swelling in your ankles.  You have trouble with your vision. Get help right away if:  You develop a severe headache or confusion.  You have unusual weakness or numbness.  You feel faint.  You have severe pain in your chest or abdomen.  You vomit repeatedly.  You have trouble breathing. Summary  Hypertension is when the force  of blood pumping through your arteries is too strong. If this condition is not controlled, it may put you at risk for serious complications.  Your personal target blood pressure may vary depending on your medical conditions, your age, and other factors. For most people, a normal blood pressure is less than 120/80.  Hypertension is treated with lifestyle changes, medicines, or a combination of both. Lifestyle changes include weight loss, eating a healthy, low-sodium diet, exercising more, and limiting alcohol. This information is not intended to replace advice given to you by your health care provider. Make sure you discuss any questions you have with your health care provider. Document Released: 05/24/2005 Document Revised: 04/21/2016 Document Reviewed: 04/21/2016 Elsevier Interactive Patient Education  [employer].

## 2017-05-09 DIAGNOSIS — R9431 Abnormal electrocardiogram [ECG] [EKG]: Secondary | ICD-10-CM | POA: Insufficient documentation

## 2017-05-09 DIAGNOSIS — R000 Tachycardia, unspecified: Secondary | ICD-10-CM | POA: Insufficient documentation

## 2017-05-10 MED FILL — AMLODIPINE BESYLATE 5 MG TA: 5 | 30 days supply | Qty: 30 | Fill #0

## 2017-05-12 ENCOUNTER — Ambulatory Visit: Payer: Self-pay

## 2017-05-12 ENCOUNTER — Other Ambulatory Visit: Payer: Self-pay | Admitting: Family Medicine

## 2017-05-12 VITALS — BP 142/78 | HR 104

## 2017-05-12 DIAGNOSIS — R000 Tachycardia, unspecified: Secondary | ICD-10-CM

## 2017-05-12 DIAGNOSIS — I10 Essential (primary) hypertension: Secondary | ICD-10-CM

## 2017-05-12 MED ORDER — METOPROLOL SUCCINATE ER 25 MG PO TB24: 12.5000 mg | ORAL_TABLET | Freq: Every day | ORAL | 5 refills | Status: DC

## 2017-05-12 MED FILL — METOPROLOL SUCC ER 25 MG TA: 25 | 15 days supply | Qty: 30 | Fill #0

## 2017-05-12 NOTE — Progress Notes (Signed)
Jennifer Forbes, a 46 year old female with a history of hypertension presents for a blood pressure check. Blood pressure is within a normal range. Heart rate continues to be elevated. Will start a trial of metoprolol 12.5 mg daily. Patient to return in 1 week.   Meds ordered this encounter  Medications  . metoprolol succinate (TOPROL XL) 25 MG 24 hr tablet    Sig: Take 0.5 tablets (12.5 mg total) by mouth daily.    Dispense:  30 tablet    Refill:  5    Nolon Nations  MSN, FNP-C Patient [facility] [address] [place], [place] [address] [phone]

## 2017-06-08 ENCOUNTER — Ambulatory Visit: Payer: No Typology Code available for payment source | Admitting: Family Medicine

## 2017-06-16 ENCOUNTER — Ambulatory Visit: Payer: No Typology Code available for payment source | Admitting: Family Medicine

## 2017-07-08 ENCOUNTER — Telehealth: Payer: Self-pay

## 2017-07-08 DIAGNOSIS — I10 Essential (primary) hypertension: Secondary | ICD-10-CM

## 2017-07-08 DIAGNOSIS — R000 Tachycardia, unspecified: Secondary | ICD-10-CM

## 2017-07-08 MED ORDER — METOPROLOL SUCCINATE ER 25 MG PO TB24: 12.5000 mg | ORAL_TABLET | Freq: Every day | ORAL | 5 refills | Status: DC

## 2017-07-08 NOTE — Telephone Encounter (Signed)
Sent into pharmacy. Thanks!  

## 2017-07-31 ENCOUNTER — Other Ambulatory Visit: Payer: Self-pay | Admitting: Family Medicine

## 2017-07-31 DIAGNOSIS — I10 Essential (primary) hypertension: Secondary | ICD-10-CM

## 2017-09-26 ENCOUNTER — Other Ambulatory Visit: Payer: Self-pay | Admitting: Family Medicine

## 2017-09-26 DIAGNOSIS — I10 Essential (primary) hypertension: Secondary | ICD-10-CM

## 2017-12-01 ENCOUNTER — Other Ambulatory Visit: Payer: Self-pay | Admitting: Family Medicine

## 2017-12-01 DIAGNOSIS — I10 Essential (primary) hypertension: Secondary | ICD-10-CM

## 2017-12-29 ENCOUNTER — Other Ambulatory Visit: Payer: Self-pay | Admitting: Family Medicine

## 2017-12-29 DIAGNOSIS — I10 Essential (primary) hypertension: Secondary | ICD-10-CM

## 2018-01-04 ENCOUNTER — Other Ambulatory Visit: Payer: Self-pay | Admitting: Family Medicine

## 2018-01-04 DIAGNOSIS — I10 Essential (primary) hypertension: Secondary | ICD-10-CM

## 2018-02-03 ENCOUNTER — Ambulatory Visit: Payer: No Typology Code available for payment source | Admitting: Family Medicine

## 2018-02-13 ENCOUNTER — Ambulatory Visit (INDEPENDENT_AMBULATORY_CARE_PROVIDER_SITE_OTHER): Payer: Self-pay | Admitting: Family Medicine

## 2018-02-13 ENCOUNTER — Encounter: Payer: Self-pay | Admitting: Family Medicine

## 2018-02-13 VITALS — BP 132/79 | HR 82 | Temp 97.7°F | Resp 16 | Ht 69.0 in | Wt 279.0 lb

## 2018-02-13 DIAGNOSIS — R000 Tachycardia, unspecified: Secondary | ICD-10-CM

## 2018-02-13 DIAGNOSIS — I10 Essential (primary) hypertension: Secondary | ICD-10-CM

## 2018-02-13 LAB — POCT URINALYSIS DIPSTICK
Bilirubin, UA: NEGATIVE
Glucose, UA: NEGATIVE
Ketones, UA: NEGATIVE
Leukocytes, UA: NEGATIVE
Nitrite, UA: NEGATIVE
Protein, UA: NEGATIVE
Spec Grav, UA: >=1.03 — AB (ref 1.010–1.025)
Urobilinogen, UA: 0.2 E.U./dL
pH, UA: 5.5 (ref 5.0–8.0)

## 2018-02-13 MED ORDER — METOPROLOL SUCCINATE ER 25 MG PO TB24: 12.5000 mg | ORAL_TABLET | Freq: Every day | ORAL | 5 refills | Status: DC

## 2018-02-13 MED ORDER — AMLODIPINE BESYLATE 5 MG PO TABS: 5.0000 mg | ORAL_TABLET | Freq: Every day | ORAL | 5 refills | Status: DC
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
## 2018-02-13 NOTE — Progress Notes (Signed)
ur

## 2018-02-13 NOTE — Patient Instructions (Signed)
Managing Your Hypertension Hypertension is commonly called high blood pressure. This is when the force of your blood pressing against the walls of your arteries is too strong. Arteries are blood vessels that carry blood from your heart throughout your body. Hypertension forces the heart to work harder to pump blood, and may cause the arteries to become narrow or stiff. Having untreated or uncontrolled hypertension can cause heart attack, stroke, kidney disease, and other problems. What are blood pressure readings? A blood pressure reading consists of a higher number over a lower number. Ideally, your blood pressure should be below 120/80. The first ("top") number is called the systolic pressure. It is a measure of the pressure in your arteries as your heart beats. The second ("bottom") number is called the diastolic pressure. It is a measure of the pressure in your arteries as the heart relaxes. What does my blood pressure reading mean? Blood pressure is classified into four stages. Based on your blood pressure reading, your health care provider may use the following stages to determine what type of treatment you need, if any. Systolic pressure and diastolic pressure are measured in a unit called mm Hg. Normal  Systolic pressure: below 120.  Diastolic pressure: below 80. Elevated  Systolic pressure: 120-129.  Diastolic pressure: below 80. Hypertension stage 1  Systolic pressure: 130-139.  Diastolic pressure: 80-89. Hypertension stage 2  Systolic pressure: 140 or above.  Diastolic pressure: 90 or above. What health risks are associated with hypertension? Managing your hypertension is an important responsibility. Uncontrolled hypertension can lead to:  A heart attack.  A stroke.  A weakened blood vessel (aneurysm).  Heart failure.  Kidney damage.  Eye damage.  Metabolic syndrome.  Memory and concentration problems.  What changes can I make to manage my  hypertension? Hypertension can be managed by making lifestyle changes and possibly by taking medicines. Your health care provider will help you make a plan to bring your blood pressure within a normal range. Eating and drinking  Eat a diet that is high in fiber and potassium, and low in salt (sodium), added sugar, and fat. An example eating plan is called the DASH (Dietary Approaches to Stop Hypertension) diet. To eat this way: ? Eat plenty of fresh fruits and vegetables. Try to fill half of your plate at each meal with fruits and vegetables. ? Eat whole grains, such as whole wheat pasta, brown rice, or whole grain bread. Fill about one quarter of your plate with whole grains. ? Eat low-fat diary products. ? Avoid fatty cuts of meat, processed or cured meats, and poultry with skin. Fill about one quarter of your plate with lean proteins such as fish, chicken without skin, beans, eggs, and tofu. ? Avoid premade and processed foods. These tend to be higher in sodium, added sugar, and fat.  Reduce your daily sodium intake. Most people with hypertension should eat less than 1,500 mg of sodium a day.  Limit alcohol intake to no more than 1 drink a day for nonpregnant women and 2 drinks a day for men. One drink equals 12 oz of beer, 5 oz of wine, or 1 oz of hard liquor. Lifestyle  Work with your health care provider to maintain a healthy body weight, or to lose weight. Ask what an ideal weight is for you.  Get at least 30 minutes of exercise that causes your heart to beat faster (aerobic exercise) most days of the week. Activities may include walking, swimming, or biking.  Include exercise   to strengthen your muscles (resistance exercise), such as weight lifting, as part of your weekly exercise routine. Try to do these types of exercises for 30 minutes at least 3 days a week.  Do not use any products that contain nicotine or tobacco, such as cigarettes and e-cigarettes. If you need help quitting, ask  your health care provider.  Control any long-term (chronic) conditions you have, such as high cholesterol or diabetes. Monitoring  Monitor your blood pressure at home as told by your health care provider. Your personal target blood pressure may vary depending on your medical conditions, your age, and other factors.  Have your blood pressure checked regularly, as often as told by your health care provider. Working with your health care provider  Review all the medicines you take with your health care provider because there may be side effects or interactions.  Talk with your health care provider about your diet, exercise habits, and other lifestyle factors that may be contributing to hypertension.  Visit your health care provider regularly. Your health care provider can help you create and adjust your plan for managing hypertension. Will I need medicine to control my blood pressure? Your health care provider may prescribe medicine if lifestyle changes are not enough to get your blood pressure under control, and if:  Your systolic blood pressure is 130 or higher.  Your diastolic blood pressure is 80 or higher.  Take medicines only as told by your health care provider. Follow the directions carefully. Blood pressure medicines must be taken as prescribed. The medicine does not work as well when you skip doses. Skipping doses also puts you at risk for problems. Contact a health care provider if:  You think you are having a reaction to medicines you have taken.  You have repeated (recurrent) headaches.  You feel dizzy.  You have swelling in your ankles.  You have trouble with your vision. Get help right away if:  You develop a severe headache or confusion.  You have unusual weakness or numbness, or you feel faint.  You have severe pain in your chest or abdomen.  You vomit repeatedly.  You have trouble breathing. Summary  Hypertension is when the force of blood pumping through  your arteries is too strong. If this condition is not controlled, it may put you at risk for serious complications.  Your personal target blood pressure may vary depending on your medical conditions, your age, and other factors. For most people, a normal blood pressure is less than 120/80.  Hypertension is managed by lifestyle changes, medicines, or both. Lifestyle changes include weight loss, eating a healthy, low-sodium diet, exercising more, and limiting alcohol. This information is not intended to replace advice given to you by your health care provider. Make sure you discuss any questions you have with your health care provider. Document Released: 02/16/2012 Document Revised: 04/21/2016 Document Reviewed: 04/21/2016 Elsevier Interactive Patient Education  2018 Elsevier Inc.  

## 2018-02-14 LAB — COMPREHENSIVE METABOLIC PANEL
ALT: 12 IU/L (ref 0–32)
AST: 15 IU/L (ref 0–40)
Albumin/Globulin Ratio: 1.3 (ref 1.2–2.2)
Albumin: 4.2 g/dL (ref 3.5–5.5)
Alkaline Phosphatase: 107 IU/L (ref 39–117)
BUN/Creatinine Ratio: 20 (ref 9–23)
BUN: 13 mg/dL (ref 6–24)
Bilirubin Total: <0.2 mg/dL (ref 0.0–1.2)
CO2: 22 mmol/L (ref 20–29)
Calcium: 9.4 mg/dL (ref 8.7–10.2)
Chloride: 104 mmol/L (ref 96–106)
Creatinine, Ser: 0.64 mg/dL (ref 0.57–1.00)
GFR calc Af Amer: 125 mL/min/{1.73_m2} (ref >59)
GFR calc non Af Amer: 108 mL/min/{1.73_m2} (ref >59)
Globulin, Total: 3.3 g/dL (ref 1.5–4.5)
Glucose: 71 mg/dL (ref 65–99)
Potassium: 4.3 mmol/L (ref 3.5–5.2)
Sodium: 142 mmol/L (ref 134–144)
Total Protein: 7.5 g/dL (ref 6.0–8.5)

## 2018-02-14 LAB — CBC WITH DIFFERENTIAL/PLATELET
Basophils Absolute: 0.1 10*3/uL (ref 0.0–0.2)
Basos: 1 %
EOS (ABSOLUTE): 0.2 10*3/uL (ref 0.0–0.4)
Eos: 4 %
Hematocrit: 40.5 % (ref 34.0–46.6)
Hemoglobin: 13.8 g/dL (ref 11.1–15.9)
Immature Grans (Abs): 0 10*3/uL (ref 0.0–0.1)
Immature Granulocytes: 0 %
Lymphocytes Absolute: 1.7 10*3/uL (ref 0.7–3.1)
Lymphs: 37 %
MCH: 29.9 pg (ref 26.6–33.0)
MCHC: 34.1 g/dL (ref 31.5–35.7)
MCV: 88 fL (ref 79–97)
Monocytes Absolute: 0.6 10*3/uL (ref 0.1–0.9)
Monocytes: 13 %
Neutrophils Absolute: 2.1 10*3/uL (ref 1.4–7.0)
Neutrophils: 45 %
Platelets: 360 10*3/uL (ref 150–450)
RBC: 4.61 x10E6/uL (ref 3.77–5.28)
RDW: 14.3 % (ref 12.3–15.4)
WBC: 4.6 10*3/uL (ref 3.4–10.8)

## 2018-02-14 LAB — LIPID PANEL WITH LDL/HDL RATIO
Cholesterol, Total: 174 mg/dL (ref 100–199)
HDL: 64 mg/dL (ref >39)
LDL Calculated: 98 mg/dL (ref 0–99)
LDl/HDL Ratio: 1.5 ratio (ref 0.0–3.2)
Triglycerides: 58 mg/dL (ref 0–149)
VLDL Cholesterol Cal: 12 mg/dL (ref 5–40)

## 2018-03-12 ENCOUNTER — Other Ambulatory Visit: Payer: Self-pay | Admitting: Family Medicine

## 2018-03-12 DIAGNOSIS — I10 Essential (primary) hypertension: Secondary | ICD-10-CM

## 2018-03-13 ENCOUNTER — Telehealth: Payer: Self-pay

## 2018-03-13 NOTE — Telephone Encounter (Signed)
Refill

## 2018-08-11 ENCOUNTER — Other Ambulatory Visit: Payer: Self-pay | Admitting: Family Medicine

## 2018-08-11 DIAGNOSIS — I10 Essential (primary) hypertension: Secondary | ICD-10-CM

## 2018-08-14 ENCOUNTER — Ambulatory Visit (INDEPENDENT_AMBULATORY_CARE_PROVIDER_SITE_OTHER): Payer: Self-pay | Admitting: Family Medicine

## 2018-08-14 ENCOUNTER — Encounter: Payer: Self-pay | Admitting: Family Medicine

## 2018-08-14 ENCOUNTER — Other Ambulatory Visit: Payer: Self-pay

## 2018-08-14 VITALS — BP 138/93 | HR 78 | Temp 97.9°F | Ht 68.0 in | Wt 288.0 lb

## 2018-08-14 DIAGNOSIS — J301 Allergic rhinitis due to pollen: Secondary | ICD-10-CM

## 2018-08-14 DIAGNOSIS — I10 Essential (primary) hypertension: Secondary | ICD-10-CM

## 2018-08-14 DIAGNOSIS — R000 Tachycardia, unspecified: Secondary | ICD-10-CM

## 2018-08-14 LAB — POCT URINALYSIS DIP (MANUAL ENTRY)
Bilirubin, UA: NEGATIVE
Blood, UA: NEGATIVE
Glucose, UA: NEGATIVE mg/dL
Ketones, POC UA: NEGATIVE mg/dL
Leukocytes, UA: NEGATIVE
Nitrite, UA: NEGATIVE
Protein Ur, POC: NEGATIVE mg/dL
Spec Grav, UA: 1.025 (ref 1.010–1.025)
Urobilinogen, UA: 0.2 E.U./dL
pH, UA: 6 (ref 5.0–8.0)

## 2018-08-14 MED ORDER — FLUTICASONE PROPIONATE 50 MCG/ACT NA SUSP: 2.0000 | Freq: Every day | NASAL | 6 refills | Status: DC

## 2018-08-14 MED ORDER — METOPROLOL SUCCINATE ER 25 MG PO TB24: 12.5000 mg | ORAL_TABLET | Freq: Every day | ORAL | 3 refills | Status: DC

## 2018-08-14 MED ORDER — AMLODIPINE BESYLATE 5 MG PO TABS: 5.0000 mg | ORAL_TABLET | Freq: Every day | ORAL | 3 refills | Status: DC

## 2018-08-14 MED ORDER — CETIRIZINE HCL 10 MG PO TABS: 10.0000 mg | ORAL_TABLET | Freq: Every day | ORAL | 11 refills | Status: DC

## 2018-08-14 NOTE — Progress Notes (Signed)
[facility]   Progress Note: General Provider: Mike Gip, FNP  SUBJECTIVE:   Jennifer Forbes is a 48 y.o. female who  has a past medical history of Arthritis and Hypertension.. Patient presents today for Hypertension; Allergic Rhinitis ; and Medication Refill Denies eating a sodium or carb restricted diet. Patient is not exercising. Patient states that she is caring for her twin grandchildren with special needs who will be 5 years old. She now has full custody of them. Patient reports compliance with all medications.  Patient denies side effects of medications.  She aslo states that she is having runny nose. Has tried claritin without relief. She has a hx of allergic rhinitis. Denies fever, chills, cough or night sweats.    Review of Systems  Constitutional: Negative.   HENT: Positive for congestion (and runny nose).   Eyes: Negative.   Respiratory: Negative.   Cardiovascular: Negative.   Gastrointestinal: Negative.   Genitourinary: Negative.   Musculoskeletal: Negative.   Skin: Negative.   Neurological: Negative.   Psychiatric/Behavioral: Negative.      OBJECTIVE: BP (!) 138/93 (BP Location: Right Arm, Patient Position: Sitting, Cuff Size: Large)   Pulse 78   Temp 97.9 F (36.6 C) (Oral)   Ht 5\' 8"  (1.727 m)   Wt 288 lb (130.6 kg)   SpO2 100%   BMI 43.79 kg/m   Wt Readings from Last 3 Encounters:  08/14/18 288 lb (130.6 kg)  02/13/18 279 lb (126.6 kg)  05/06/17 269 lb (122 kg)     Physical Exam Vitals signs and nursing note reviewed.  Constitutional:      General: She is not in acute distress.    Appearance: She is well-developed.  HENT:     Head: Normocephalic and atraumatic.     Nose: Congestion (swollen pale turbinates bilaterally) and rhinorrhea present.  Eyes:     Conjunctiva/sclera: Conjunctivae normal.     Pupils: Pupils are equal, round, and reactive to light.  Neck:     Musculoskeletal: Normal range  of motion.  Cardiovascular:     Rate and Rhythm: Normal rate and regular rhythm.     Heart sounds: Normal heart sounds.  Pulmonary:     Effort: Pulmonary effort is normal. No respiratory distress.     Breath sounds: Normal breath sounds.  Abdominal:     General: Bowel sounds are normal. There is no distension.     Palpations: Abdomen is soft.  Musculoskeletal: Normal range of motion.  Skin:    General: Skin is warm and dry.  Neurological:     Mental Status: She is alert and oriented to person, place, and time.  Psychiatric:        Behavior: Behavior normal.        Thought Content: Thought content normal.     ASSESSMENT/PLAN:   1. Essential hypertension No medication changes warranted at the present time.  The patient is asked to make an attempt to improve diet and exercise patterns to aid in medical management of this problem.  - POCT urinalysis dipstick - amLODipine (NORVASC) 5 MG tablet; Take 1 tablet (5 mg total) by mouth daily.  Dispense: 90 tablet; Refill: 3 - Lipid Panel - Comprehensive metabolic panel  2. Non-seasonal allergic rhinitis due to pollen Sinus lavage recommended.  - cetirizine (ZYRTEC) 10 MG tablet; Take 1 tablet (10 mg total) by mouth daily.  Dispense: 30 tablet; Refill: 11 - fluticasone (FLONASE) 50 MCG/ACT nasal spray; Place 2  sprays into both nostrils daily.  Dispense: 16 g; Refill: 6  3. Tachycardia No medication changes warranted at the present time.   - metoprolol succinate (TOPROL XL) 25 MG 24 hr tablet; Take 0.5 tablets (12.5 mg total) by mouth daily.  Dispense: 45 tablet; Refill: 3    Return in about 6 months (around 02/14/2019) for HTN.    The patient was given clear instructions to go to ER or return to medical center if symptoms do not improve, worsen or new problems develop. The patient verbalized understanding and agreed with plan of care.   Ms. Jennifer Forbes. Riley Lam, FNP-BC [facility] [address] [place], [place] [address] [phone]

## 2018-08-14 NOTE — Patient Instructions (Signed)
DASH Eating Plan  DASH stands for "Dietary Approaches to Stop Hypertension." The DASH eating plan is a healthy eating plan that has been shown to reduce high blood pressure (hypertension). It may also reduce your risk for type 2 diabetes, heart disease, and stroke. The DASH eating plan may also help with weight loss.  What are tips for following this plan?    General guidelines   Avoid eating more than 2,300 mg (milligrams) of salt (sodium) a day. If you have hypertension, you may need to reduce your sodium intake to 1,500 mg a day.   Limit alcohol intake to no more than 1 drink a day for nonpregnant women and 2 drinks a day for men. One drink equals 12 oz of beer, 5 oz of wine, or 1 oz of hard liquor.   Work with your health care provider to maintain a healthy body weight or to lose weight. Ask what an ideal weight is for you.   Get at least 30 minutes of exercise that causes your heart to beat faster (aerobic exercise) most days of the week. Activities may include walking, swimming, or biking.   Work with your health care provider or diet and nutrition specialist (dietitian) to adjust your eating plan to your individual calorie needs.  Reading food labels     Check food labels for the amount of sodium per serving. Choose foods with less than 5 percent of the Daily Value of sodium. Generally, foods with less than 300 mg of sodium per serving fit into this eating plan.   To find whole grains, look for the word "whole" as the first word in the ingredient list.  Shopping   Buy products labeled as "low-sodium" or "no salt added."   Buy fresh foods. Avoid canned foods and premade or frozen meals.  Cooking   Avoid adding salt when cooking. Use salt-free seasonings or herbs instead of table salt or sea salt. Check with your health care provider or pharmacist before using salt substitutes.   Do not fry foods. Cook foods using healthy methods such as baking, boiling, grilling, and broiling instead.   Cook with  heart-healthy oils, such as olive, canola, soybean, or sunflower oil.  Meal planning   Eat a balanced diet that includes:  ? 5 or more servings of fruits and vegetables each day. At each meal, try to fill half of your plate with fruits and vegetables.  ? Up to 6-8 servings of whole grains each day.  ? Less than 6 oz of lean meat, poultry, or fish each day. A 3-oz serving of meat is about the same size as a deck of cards. One egg equals 1 oz.  ? 2 servings of low-fat dairy each day.  ? A serving of nuts, seeds, or beans 5 times each week.  ? Heart-healthy fats. Healthy fats called Omega-3 fatty acids are found in foods such as flaxseeds and coldwater fish, like sardines, salmon, and mackerel.   Limit how much you eat of the following:  ? Canned or prepackaged foods.  ? Food that is high in trans fat, such as fried foods.  ? Food that is high in saturated fat, such as fatty meat.  ? Sweets, desserts, sugary drinks, and other foods with added sugar.  ? Full-fat dairy products.   Do not salt foods before eating.   Try to eat at least 2 vegetarian meals each week.   Eat more home-cooked food and less restaurant, buffet, and fast food.     When eating at a restaurant, ask that your food be prepared with less salt or no salt, if possible.  What foods are recommended?  The items listed may not be a complete list. Talk with your dietitian about what dietary choices are best for you.  Grains  Whole-grain or whole-wheat bread. Whole-grain or whole-wheat pasta. Brown rice. Oatmeal. Quinoa. Bulgur. Whole-grain and low-sodium cereals. Pita bread. Low-fat, low-sodium crackers. Whole-wheat flour tortillas.  Vegetables  Fresh or frozen vegetables (raw, steamed, roasted, or grilled). Low-sodium or reduced-sodium tomato and vegetable juice. Low-sodium or reduced-sodium tomato sauce and tomato paste. Low-sodium or reduced-sodium canned vegetables.  Fruits  All fresh, dried, or frozen fruit. Canned fruit in natural juice (without  added sugar).  Meat and other protein foods  Skinless chicken or turkey. Ground chicken or turkey. Pork with fat trimmed off. Fish and seafood. Egg whites. Dried beans, peas, or lentils. Unsalted nuts, nut butters, and seeds. Unsalted canned beans. Lean cuts of beef with fat trimmed off. Low-sodium, lean deli meat.  Dairy  Low-fat (1%) or fat-free (skim) milk. Fat-free, low-fat, or reduced-fat cheeses. Nonfat, low-sodium ricotta or cottage cheese. Low-fat or nonfat yogurt. Low-fat, low-sodium cheese.  Fats and oils  Soft margarine without trans fats. Vegetable oil. Low-fat, reduced-fat, or light mayonnaise and salad dressings (reduced-sodium). Canola, safflower, olive, soybean, and sunflower oils. Avocado.  Seasoning and other foods  Herbs. Spices. Seasoning mixes without salt. Unsalted popcorn and pretzels. Fat-free sweets.  What foods are not recommended?  The items listed may not be a complete list. Talk with your dietitian about what dietary choices are best for you.  Grains  Baked goods made with fat, such as croissants, muffins, or some breads. Dry pasta or rice meal packs.  Vegetables  Creamed or fried vegetables. Vegetables in a cheese sauce. Regular canned vegetables (not low-sodium or reduced-sodium). Regular canned tomato sauce and paste (not low-sodium or reduced-sodium). Regular tomato and vegetable juice (not low-sodium or reduced-sodium). Pickles. Olives.  Fruits  Canned fruit in a light or heavy syrup. Fried fruit. Fruit in cream or butter sauce.  Meat and other protein foods  Fatty cuts of meat. Ribs. Fried meat. Bacon. Sausage. Bologna and other processed lunch meats. Salami. Fatback. Hotdogs. Bratwurst. Salted nuts and seeds. Canned beans with added salt. Canned or smoked fish. Whole eggs or egg yolks. Chicken or turkey with skin.  Dairy  Whole or 2% milk, cream, and half-and-half. Whole or full-fat cream cheese. Whole-fat or sweetened yogurt. Full-fat cheese. Nondairy creamers. Whipped toppings.  Processed cheese and cheese spreads.  Fats and oils  Butter. Stick margarine. Lard. Shortening. Ghee. Bacon fat. Tropical oils, such as coconut, palm kernel, or palm oil.  Seasoning and other foods  Salted popcorn and pretzels. Onion salt, garlic salt, seasoned salt, table salt, and sea salt. Worcestershire sauce. Tartar sauce. Barbecue sauce. Teriyaki sauce. Soy sauce, including reduced-sodium. Steak sauce. Canned and packaged gravies. Fish sauce. Oyster sauce. Cocktail sauce. Horseradish that you find on the shelf. Ketchup. Mustard. Meat flavorings and tenderizers. Bouillon cubes. Hot sauce and Tabasco sauce. Premade or packaged marinades. Premade or packaged taco seasonings. Relishes. Regular salad dressings.  Where to find more information:   National Heart, Lung, and Blood Institute: www.nhlbi.nih.gov   American Heart Association: www.heart.org  Summary   The DASH eating plan is a healthy eating plan that has been shown to reduce high blood pressure (hypertension). It may also reduce your risk for type 2 diabetes, heart disease, and stroke.   With the   DASH eating plan, you should limit salt (sodium) intake to 2,300 mg a day. If you have hypertension, you may need to reduce your sodium intake to 1,500 mg a day.   When on the DASH eating plan, aim to eat more fresh fruits and vegetables, whole grains, lean proteins, low-fat dairy, and heart-healthy fats.   Work with your health care provider or diet and nutrition specialist (dietitian) to adjust your eating plan to your individual calorie needs.  This information is not intended to replace advice given to you by your health care provider. Make sure you discuss any questions you have with your health care provider.  Document Released: 05/13/2011 Document Revised: 05/17/2016 Document Reviewed: 05/17/2016  Elsevier Interactive Patient Education  2019 Elsevier Inc.

## 2018-08-15 LAB — COMPREHENSIVE METABOLIC PANEL
ALT: 11 IU/L (ref 0–32)
AST: 14 IU/L (ref 0–40)
Albumin/Globulin Ratio: 1.4 (ref 1.2–2.2)
Albumin: 4.3 g/dL (ref 3.8–4.8)
Alkaline Phosphatase: 115 IU/L (ref 39–117)
BUN/Creatinine Ratio: 19 (ref 9–23)
BUN: 13 mg/dL (ref 6–24)
Bilirubin Total: <0.2 mg/dL (ref 0.0–1.2)
CO2: 19 mmol/L — ABNORMAL LOW (ref 20–29)
Calcium: 9.3 mg/dL (ref 8.7–10.2)
Chloride: 103 mmol/L (ref 96–106)
Creatinine, Ser: 0.68 mg/dL (ref 0.57–1.00)
GFR calc Af Amer: 121 mL/min/{1.73_m2} (ref >59)
GFR calc non Af Amer: 105 mL/min/{1.73_m2} (ref >59)
Globulin, Total: 3 g/dL (ref 1.5–4.5)
Glucose: 82 mg/dL (ref 65–99)
Potassium: 4.4 mmol/L (ref 3.5–5.2)
Sodium: 142 mmol/L (ref 134–144)
Total Protein: 7.3 g/dL (ref 6.0–8.5)

## 2018-08-15 LAB — LIPID PANEL
Chol/HDL Ratio: 2.6 ratio (ref 0.0–4.4)
Cholesterol, Total: 167 mg/dL (ref 100–199)
HDL: 65 mg/dL (ref >39)
LDL Calculated: 85 mg/dL (ref 0–99)
Triglycerides: 84 mg/dL (ref 0–149)
VLDL Cholesterol Cal: 17 mg/dL (ref 5–40)

## 2018-10-03 ENCOUNTER — Other Ambulatory Visit (HOSPITAL_COMMUNITY): Payer: Self-pay | Admitting: *Deleted

## 2018-10-03 DIAGNOSIS — R2232 Localized swelling, mass and lump, left upper limb: Secondary | ICD-10-CM

## 2018-10-09 ENCOUNTER — Encounter (HOSPITAL_COMMUNITY): Payer: Self-pay

## 2018-10-09 NOTE — Progress Notes (Signed)
Spoke with patient on the phone regarding [facility] appointment on 10/10/2018. Asked patient COVID-19 screening questions. Patient answered no to all questions. °

## 2018-10-10 ENCOUNTER — Ambulatory Visit
Admission: RE | Admit: 2018-10-10 | Discharge: 2018-10-10 | Disposition: A | Discharge status: Home / Self Care | Payer: No Typology Code available for payment source | Source: Ambulatory Visit | Attending: Obstetrics and Gynecology | Admitting: Obstetrics and Gynecology

## 2018-10-10 ENCOUNTER — Other Ambulatory Visit: Payer: Self-pay

## 2018-10-10 ENCOUNTER — Encounter (HOSPITAL_COMMUNITY): Payer: Self-pay

## 2018-10-10 ENCOUNTER — Ambulatory Visit (HOSPITAL_COMMUNITY)
Admission: RE | Admit: 2018-10-10 | Discharge: 2018-10-10 | Disposition: A | Discharge status: Home / Self Care | Payer: No Typology Code available for payment source | Source: Ambulatory Visit | Attending: Obstetrics and Gynecology | Admitting: Obstetrics and Gynecology

## 2018-10-10 VITALS — BP 122/78 | Wt 289.0 lb

## 2018-10-10 DIAGNOSIS — R2232 Localized swelling, mass and lump, left upper limb: Secondary | ICD-10-CM

## 2018-10-10 DIAGNOSIS — Z1239 Encounter for other screening for malignant neoplasm of breast: Secondary | ICD-10-CM

## 2018-10-10 DIAGNOSIS — N644 Mastodynia: Secondary | ICD-10-CM

## 2018-10-10 NOTE — Progress Notes (Signed)
Complaints of left questionable axillary lump and pain x one week. Patient states the pain comes and goes. Patient rates the pain at a 2-3 out of 10.  Pap Smear: Pap smear not completed today. Last Pap smear was in February 2018 at the free cervical cancer screening at the [facility] and normal per patient. Per patient has a history of an abnormal Pap smear in 1993 that a colposcopy was completed for follow-up. Patient states all Pap smears have been normal since colposcopy and has had more than three normal Pap smears. No Pap smear results are in Epic.  Physical exam: Breasts Breasts symmetrical. No skin abnormalities bilateral breasts. No nipple retraction bilateral breasts. No nipple discharge bilateral breasts. No lymphadenopathy. No lumps palpated bilateral breasts. Complaints of left outer breast pain on exam. Referred patient to the [facility] for a diagnostic mammogram. Appointment scheduled for Tuesday, Oct 10, 2018 at 1440.        Pelvic/Bimanual No Pap smear completed today since last Pap smear was in February 2018 per patient. Pap smear not indicated per [facility] guidelines.   Smoking History: Patient is a current smoker. Discussed smoking cessation with patient. Referred to the [facility] Quitline and gave resources to the free smoking cessation classes at [facility].  Patient Navigation: Patient education provided. Access to services provided for patient through [facility] program.   Breast and Cervical Cancer Risk Assessment: Patient has a family history of her sister and a maternal aunt having breast cancer. patient has no known genetic mutations or history of radiation treatment to the chest before age 74. Per patient has a history of cervical dysplasia. Patient has no history of being immunocompromised or DES exposure in-utero.  Risk Assessment    Risk Scores      10/10/2018   Last edited by: Lynnell Dike, LPN   5-year risk: 1.6 %   Lifetime risk: 14.3  %

## 2018-10-10 NOTE — Patient Instructions (Signed)
Explained breast self awareness with [name]. Patient did not need a Pap smear today due to last Pap smear was in February 2018 per patient. Let her know [facility] will cover Pap smears every 3 years unless has a history of abnormal Pap smears. Referred patient to the [facility] for a diagnostic mammogram. Appointment scheduled for Tuesday, Oct 10, 2018 at 1440. Patient aware of appointment and will be there. Discussed smoking cessation with patient. Referred to the [facility] Quitline and gave resources to the free smoking cessation classes at [facility]. [name] verbalized understanding.  Tequila Rottmann, Kathaleen Maser, RN 2:28 PM

## 2018-10-23 ENCOUNTER — Encounter (HOSPITAL_COMMUNITY): Payer: Self-pay | Admitting: *Deleted

## 2019-02-14 ENCOUNTER — Encounter (HOSPITAL_COMMUNITY): Payer: Self-pay

## 2019-02-14 ENCOUNTER — Encounter: Payer: Self-pay | Admitting: Family Medicine

## 2019-02-14 ENCOUNTER — Ambulatory Visit (INDEPENDENT_AMBULATORY_CARE_PROVIDER_SITE_OTHER): Payer: Self-pay | Admitting: Family Medicine

## 2019-02-14 ENCOUNTER — Other Ambulatory Visit: Payer: Self-pay

## 2019-02-14 ENCOUNTER — Encounter (HOSPITAL_COMMUNITY): Payer: Self-pay | Admitting: *Deleted

## 2019-02-14 VITALS — BP 119/74 | HR 75 | Temp 97.8°F | Resp 16 | Ht 68.0 in | Wt 283.0 lb

## 2019-02-14 DIAGNOSIS — M545 Low back pain, unspecified: Secondary | ICD-10-CM

## 2019-02-14 DIAGNOSIS — R000 Tachycardia, unspecified: Secondary | ICD-10-CM

## 2019-02-14 DIAGNOSIS — I10 Essential (primary) hypertension: Secondary | ICD-10-CM

## 2019-02-14 DIAGNOSIS — G8929 Other chronic pain: Secondary | ICD-10-CM

## 2019-02-14 DIAGNOSIS — Z23 Encounter for immunization: Secondary | ICD-10-CM

## 2019-02-14 LAB — POCT URINALYSIS DIPSTICK
Bilirubin, UA: NEGATIVE
Blood, UA: NEGATIVE
Glucose, UA: NEGATIVE
Ketones, UA: NEGATIVE
Leukocytes, UA: NEGATIVE
Nitrite, UA: NEGATIVE
Protein, UA: NEGATIVE
Spec Grav, UA: 1.025 (ref 1.010–1.025)
Urobilinogen, UA: 1 E.U./dL
pH, UA: 5.5 (ref 5.0–8.0)

## 2019-02-14 MED ORDER — METOPROLOL SUCCINATE ER 25 MG PO TB24: 12.5000 mg | ORAL_TABLET | Freq: Every day | ORAL | 3 refills | Status: DC

## 2019-02-14 MED ORDER — AMLODIPINE BESYLATE 5 MG PO TABS: 5.0000 mg | ORAL_TABLET | Freq: Every day | ORAL | 3 refills | Status: DC

## 2019-02-14 MED ORDER — NAPROXEN 500 MG PO TABS: 500.0000 mg | ORAL_TABLET | Freq: Two times a day (BID) | ORAL | 0 refills | Status: DC

## 2019-02-14 NOTE — Patient Instructions (Signed)
Health Maintenance, Female Adopting a healthy lifestyle and getting preventive care are important in promoting health and wellness. Ask your health care provider about:  The right schedule for you to have regular tests and exams.  Things you can do on your own to prevent diseases and keep yourself healthy. What should I know about diet, weight, and exercise? Eat a healthy diet   Eat a diet that includes plenty of vegetables, fruits, low-fat dairy products, and lean protein.  Do not eat a lot of foods that are high in solid fats, added sugars, or sodium. Maintain a healthy weight Body mass index (BMI) is used to identify weight problems. It estimates body fat based on height and weight. Your health care provider can help determine your BMI and help you achieve or maintain a healthy weight. Get regular exercise Get regular exercise. This is one of the most important things you can do for your health. Most adults should:  Exercise for at least 150 minutes each week. The exercise should increase your heart rate and make you sweat (moderate-intensity exercise).  Do strengthening exercises at least twice a week. This is in addition to the moderate-intensity exercise.  Spend less time sitting. Even light physical activity can be beneficial. Watch cholesterol and blood lipids Have your blood tested for lipids and cholesterol at 47 years of age, then have this test every 5 years. Have your cholesterol levels checked more often if:  Your lipid or cholesterol levels are high.  You are older than 47 years of age.  You are at high risk for heart disease. What should I know about cancer screening? Depending on your health history and family history, you may need to have cancer screening at various ages. This may include screening for:  Breast cancer.  Cervical cancer.  Colorectal cancer.  Skin cancer.  Lung cancer. What should I know about heart disease, diabetes, and high blood  pressure? Blood pressure and heart disease  High blood pressure causes heart disease and increases the risk of stroke. This is more likely to develop in people who have high blood pressure readings, are of African descent, or are overweight.  Have your blood pressure checked: ? Every 3-5 years if you are 18-39 years of age. ? Every year if you are 40 years old or older. Diabetes Have regular diabetes screenings. This checks your fasting blood sugar level. Have the screening done:  Once every three years after age 40 if you are at a normal weight and have a low risk for diabetes.  More often and at a younger age if you are overweight or have a high risk for diabetes. What should I know about preventing infection? Hepatitis B If you have a higher risk for hepatitis B, you should be screened for this virus. Talk with your health care provider to find out if you are at risk for hepatitis B infection. Hepatitis C Testing is recommended for:  Everyone born from 1945 through 1965.  Anyone with known risk factors for hepatitis C. Sexually transmitted infections (STIs)  Get screened for STIs, including gonorrhea and chlamydia, if: ? You are sexually active and are younger than 47 years of age. ? You are older than 47 years of age and your health care provider tells you that you are at risk for this type of infection. ? Your sexual activity has changed since you were last screened, and you are at increased risk for chlamydia or gonorrhea. Ask your health care provider if   you are at risk.  Ask your health care provider about whether you are at high risk for HIV. Your health care provider may recommend a prescription medicine to help prevent HIV infection. If you choose to take medicine to prevent HIV, you should first get tested for HIV. You should then be tested every 3 months for as long as you are taking the medicine. Pregnancy  If you are about to stop having your period (premenopausal) and  you may become pregnant, seek counseling before you get pregnant.  Take 400 to 800 micrograms (mcg) of folic acid every day if you become pregnant.  Ask for birth control (contraception) if you want to prevent pregnancy. Osteoporosis and menopause Osteoporosis is a disease in which the bones lose minerals and strength with aging. This can result in bone fractures. If you are 65 years old or older, or if you are at risk for osteoporosis and fractures, ask your health care provider if you should:  Be screened for bone loss.  Take a calcium or vitamin D supplement to lower your risk of fractures.  Be given hormone replacement therapy (HRT) to treat symptoms of menopause. Follow these instructions at home: Lifestyle  Do not use any products that contain nicotine or tobacco, such as cigarettes, e-cigarettes, and chewing tobacco. If you need help quitting, ask your health care provider.  Do not use street drugs.  Do not share needles.  Ask your health care provider for help if you need support or information about quitting drugs. Alcohol use  Do not drink alcohol if: ? Your health care provider tells you not to drink. ? You are pregnant, may be pregnant, or are planning to become pregnant.  If you drink alcohol: ? Limit how much you use to 0-1 drink a day. ? Limit intake if you are breastfeeding.  Be aware of how much alcohol is in your drink. In the [place] one drink equals one 12 oz bottle of beer (355 mL), one 5 oz glass of wine (148 mL), or one 1 oz glass of hard liquor (44 mL). General instructions  Schedule regular health, dental, and eye exams.  Stay current with your vaccines.  Tell your health care provider if: ? You often feel depressed. ? You have ever been abused or do not feel safe at home. Summary  Adopting a healthy lifestyle and getting preventive care are important in promoting health and wellness.  Follow your health care provider's instructions about healthy  diet, exercising, and getting tested or screened for diseases.  Follow your health care provider's instructions on monitoring your cholesterol and blood pressure. This information is not intended to replace advice given to you by your health care provider. Make sure you discuss any questions you have with your health care provider. Document Released: 12/07/2010 Document Revised: 05/17/2018 Document Reviewed: 05/17/2018 Elsevier Patient Education  2020 [employer]  

## 2019-02-14 NOTE — Progress Notes (Signed)
Patient [facility]   Progress Note: General Provider: Lanae Boast, FNP  SUBJECTIVE:   Jennifer Forbes is a 48 y.o. female who  has a past medical history of Arthritis and Hypertension.. Patient presents today for Hypertension and Back Pain Patient reports compliance with all medications.  Patient denies side effects of medications. She is not currently exercising or following a low sodium diet. She was recently granted custody of her twin 54 year old grandchildren who have special needs and has been under a great deal of stress due to this.   Review of Systems  Constitutional: Negative.   HENT: Negative.   Eyes: Negative.   Respiratory: Negative.   Cardiovascular: Negative.   Gastrointestinal: Negative.   Genitourinary: Negative.   Musculoskeletal: Negative.   Skin: Negative.   Neurological: Negative.   Psychiatric/Behavioral: Negative.      OBJECTIVE: BP 119/74 (BP Location: Left Arm, Patient Position: Sitting, Cuff Size: Large)   Pulse 75   Temp 97.8 F (36.6 C) (Oral)   Resp 16   Ht 5\' 8"  (1.727 m)   Wt 283 lb (128.4 kg)   LMP 01/20/2019   SpO2 97%   BMI 43.03 kg/m   Wt Readings from Last 3 Encounters:  02/14/19 283 lb (128.4 kg)  10/10/18 289 lb (131.1 kg)  08/14/18 288 lb (130.6 kg)     Physical Exam Vitals signs and nursing note reviewed.  Constitutional:      General: She is not in acute distress.    Appearance: Normal appearance.  HENT:     Head: Normocephalic and atraumatic.  Eyes:     Extraocular Movements: Extraocular movements intact.     Conjunctiva/sclera: Conjunctivae normal.     Pupils: Pupils are equal, round, and reactive to light.  Cardiovascular:     Rate and Rhythm: Normal rate and regular rhythm.     Heart sounds: No murmur.  Pulmonary:     Effort: Pulmonary effort is normal.     Breath sounds: Normal breath sounds.  Musculoskeletal: Normal range of motion.  Skin:    General: Skin is warm  and dry.  Neurological:     Mental Status: She is alert and oriented to person, place, and time.  Psychiatric:        Mood and Affect: Mood normal.        Behavior: Behavior normal.        Thought Content: Thought content normal.        Judgment: Judgment normal.     ASSESSMENT/PLAN:  1. Essential hypertension  - Urinalysis Dipstick - amLODipine (NORVASC) 5 MG tablet; Take 1 tablet (5 mg total) by mouth daily.  Dispense: 90 tablet; Refill: 3 - Comprehensive metabolic panel  2. Tachycardia - metoprolol succinate (TOPROL XL) 25 MG 24 hr tablet; Take 0.5 tablets (12.5 mg total) by mouth daily.  Dispense: 45 tablet; Refill: 3  3. Chronic low back pain without sciatica, unspecified back pain laterality - naproxen (NAPROSYN) 500 MG tablet; Take 1 tablet (500 mg total) by mouth 2 (two) times daily with a meal.  Dispense: 30 tablet; Refill: 0     Return in about 3 months (around 05/16/2019) for htn.    The patient was given clear instructions to go to ER or return to medical center if symptoms do not improve, worsen or new problems develop. The patient verbalized understanding and agreed with plan of care.   Ms. Doug Sou. Nathaneil Canary, FNP-BC [facility] [address] [place], [place] [address] [phone]

## 2019-02-15 LAB — COMPREHENSIVE METABOLIC PANEL
ALT: 8 IU/L (ref 0–32)
AST: 15 IU/L (ref 0–40)
Albumin/Globulin Ratio: 1.6 (ref 1.2–2.2)
Albumin: 4.2 g/dL (ref 3.8–4.8)
Alkaline Phosphatase: 123 IU/L — ABNORMAL HIGH (ref 39–117)
BUN/Creatinine Ratio: 13 (ref 9–23)
BUN: 9 mg/dL (ref 6–24)
Bilirubin Total: <0.2 mg/dL (ref 0.0–1.2)
CO2: 23 mmol/L (ref 20–29)
Calcium: 9 mg/dL (ref 8.7–10.2)
Chloride: 105 mmol/L (ref 96–106)
Creatinine, Ser: 0.67 mg/dL (ref 0.57–1.00)
GFR calc Af Amer: 122 mL/min/{1.73_m2} (ref >59)
GFR calc non Af Amer: 106 mL/min/{1.73_m2} (ref >59)
Globulin, Total: 2.7 g/dL (ref 1.5–4.5)
Glucose: 67 mg/dL (ref 65–99)
Potassium: 4.2 mmol/L (ref 3.5–5.2)
Sodium: 143 mmol/L (ref 134–144)
Total Protein: 6.9 g/dL (ref 6.0–8.5)

## 2019-02-15 NOTE — Progress Notes (Signed)
Your labs are stable. Continue with your current medications. Please remember to keep your follow up appointment. If you have problems, questions or concerns, please make an appointment to discuss. Thanks!

## 2019-02-16 ENCOUNTER — Telehealth: Payer: Self-pay

## 2019-02-16 NOTE — Telephone Encounter (Signed)
-----   Message from Lanae Boast, [place] sent at 02/15/2019  3:39 PM EDT ----- Your labs are stable. Continue with your current medications. Please remember to keep your follow up appointment. If you have problems, questions or concerns, please make an appointment to discuss. Thanks!

## 2019-02-16 NOTE — Telephone Encounter (Signed)
Called and spoke with patient, advised that labs are stable and to continue current medications as directed. Thanks!  

## 2019-03-03 ENCOUNTER — Other Ambulatory Visit: Payer: Self-pay | Admitting: Family Medicine

## 2019-03-03 DIAGNOSIS — G8929 Other chronic pain: Secondary | ICD-10-CM

## 2019-05-16 ENCOUNTER — Ambulatory Visit: Payer: No Typology Code available for payment source | Admitting: Family Medicine

## 2019-10-03 ENCOUNTER — Encounter: Payer: Self-pay | Admitting: Nurse Practitioner

## 2019-10-03 ENCOUNTER — Other Ambulatory Visit: Payer: Self-pay

## 2019-10-03 ENCOUNTER — Ambulatory Visit (INDEPENDENT_AMBULATORY_CARE_PROVIDER_SITE_OTHER): Payer: Self-pay | Admitting: Nurse Practitioner

## 2019-10-03 VITALS — BP 135/85 | HR 84 | Ht 68.0 in | Wt 257.8 lb

## 2019-10-03 DIAGNOSIS — I10 Essential (primary) hypertension: Secondary | ICD-10-CM

## 2019-10-03 DIAGNOSIS — G8929 Other chronic pain: Secondary | ICD-10-CM

## 2019-10-03 DIAGNOSIS — M5442 Lumbago with sciatica, left side: Secondary | ICD-10-CM

## 2019-10-03 DIAGNOSIS — K219 Gastro-esophageal reflux disease without esophagitis: Secondary | ICD-10-CM

## 2019-10-03 DIAGNOSIS — R112 Nausea with vomiting, unspecified: Secondary | ICD-10-CM

## 2019-10-03 DIAGNOSIS — M5441 Lumbago with sciatica, right side: Secondary | ICD-10-CM

## 2019-10-03 MED ORDER — PANTOPRAZOLE SODIUM 40 MG PO TBEC
40.0000 mg | DELAYED_RELEASE_TABLET | Freq: Every day | ORAL | 3 refills | Status: DC
Start: 2019-10-03 — End: 2021-01-26

## 2019-10-03 MED ORDER — METHOCARBAMOL 500 MG PO TABS: 500.0000 mg | ORAL_TABLET | Freq: Every evening | ORAL | 0 refills | Status: DC | PRN

## 2019-10-03 MED ORDER — ONDANSETRON 8 MG PO TBDP: 8.0000 mg | ORAL_TABLET | Freq: Two times a day (BID) | ORAL | 0 refills | Status: AC

## 2019-10-03 NOTE — Patient Instructions (Addendum)
Nausea and Vomiting, Adult Nausea is feeling sick to your stomach or feeling that you are about to throw up (vomit). Vomiting is when food in your stomach is thrown up and out of the mouth. Throwing up can make you feel weak. It can also make you lose too much water in your body (get dehydrated). If you lose too much water in your body, you may:  Feel tired.  Feel thirsty.  Have a dry mouth.  Have cracked lips.  Go pee (urinate) less often. Older adults and people with other diseases or a weak body defense system (immune system) are at higher risk for losing too much water in the body. If you feel sick to your stomach and you throw up, it is important to follow instructions from your doctor about how to take care of yourself. Follow these instructions at home: Watch your symptoms for any changes. Tell your doctor about them. Follow these instructions to care for yourself at home. Eating and drinking      Take an ORS (oral rehydration solution). This is a drink that is sold at pharmacies and stores.  Drink clear fluids in small amounts as you are able, such as: ? Water. ? Ice chips. ? Fruit juice that has water added (diluted fruit juice). ? Low-calorie sports drinks.  Eat bland, easy-to-digest foods in small amounts as you are able, such as: ? Bananas. ? Applesauce. ? Rice. ? Low-fat (lean) meats. ? Toast. ? Crackers.  Avoid drinking fluids that have a lot of sugar or caffeine in them. This includes energy drinks, sports drinks, and soda.  Avoid alcohol.  Avoid spicy or fatty foods. General instructions  Take over-the-counter and prescription medicines only as told by your doctor.  Drink enough fluid to keep your pee (urine) pale yellow.  Wash your hands often with soap and water. If you cannot use soap and water, use hand sanitizer.  Make sure that all people in your home wash their hands well and often.  Rest at home while you get better.  Watch your condition  for any changes.  Take slow and deep breaths when you feel sick to your stomach.  Keep all follow-up visits as told by your doctor. This is important. Contact a doctor if:  Your symptoms get worse.  You have new symptoms.  You have a fever.  You cannot drink fluids without throwing up.  You feel sick to your stomach for more than 2 days.  You feel light-headed or dizzy.  You have a headache.  You have muscle cramps.  You have a rash.  You have pain while peeing. Get help right away if:  You have pain in your chest, neck, arm, or jaw.  You feel very weak or you pass out (faint).  You throw up again and again.  You have throw up that is bright red or looks like black coffee grounds.  You have bloody or black poop (stools) or poop that looks like tar.  You have a very bad headache, a stiff neck, or both.  You have very bad pain, cramping, or bloating in your belly (abdomen).  You have trouble breathing.  You are breathing very quickly.  Your heart is beating very quickly.  Your skin feels cold and clammy. You feel confused. Chronic Back Pain When back pain lasts longer than 3 months, it is called chronic back pain. Pain may get worse at certain times (flare-ups). There are things you can do at home to  manage your pain. Follow these instructions at home: Activity     Avoid bending and other activities that make pain worse. When standing: Keep your upper back and neck straight. Keep your shoulders pulled back. Avoid slouching. When sitting: Keep your back straight. Relax your shoulders. Do not round your shoulders or pull them backward. Do not sit or stand in one place for long periods of time. Take short rest breaks during the day. Lying down or standing is usually better than sitting. Resting can help relieve pain. When sitting or lying down for a long time, do some mild activity or stretching. This will help to prevent stiffness and pain. Get regular  exercise. Ask your doctor what activities are safe for you. Do not lift anything that is heavier than 10 lb (4.5 kg). To prevent injury when you lift things: Bend your knees. Keep the weight close to your body. Avoid twisting. Managing pain If told, put ice on the painful area. Your doctor may tell you to use ice for 24-48 hours after a flare-up starts. Put ice in a plastic bag. Place a towel between your skin and the bag. Leave the ice on for 20 minutes, 2-3 times a day. If told, put heat on the painful area as often as told by your doctor. Use the heat source that your doctor recommends, such as a moist heat pack or a heating pad. Place a towel between your skin and the heat source. Leave the heat on for 20-30 minutes. Remove the heat if your skin turns bright red. This is especially important if you are unable to feel pain, heat, or cold. You may have a greater risk of getting burned. Soak in a warm bath. This can help relieve pain. Take over-the-counter and prescription medicines only as told by your doctor. General instructions Sleep on a firm mattress. Try lying on your side with your knees slightly bent. If you lie on your back, put a pillow under your knees. Keep all follow-up visits as told by your doctor. This is important. Contact a doctor if: You have pain that does not get better with rest or medicine. Get help right away if: One or both of your arms or legs feel weak. One or both of your arms or legs lose feeling (numbness). You have trouble controlling when you poop (bowel movement) or pee (urinate). You feel sick to your stomach (nauseous). You throw up (vomit). You have belly (abdominal) pain. You have shortness of breath. You pass out (faint). Summary When back pain lasts longer than 3 months, it is called chronic back pain. Pain may get worse at certain times (flare-ups). Use ice and heat as told by your doctor. Your doctor may tell you to use ice after  flare-ups. This information is not intended to replace advice given to you by your health care provider. Make sure you discuss any questions you have with your health care provider. Document Revised: 09/14/2018 Document Reviewed: 01/06/2017 Elsevier Patient Education  2020 [place] have signs of losing too much water in your body, such as: ? Dark pee, very little pee, or no pee. ? Cracked lips. ? Dry mouth. ? Sunken eyes. ? Sleepiness. ? Weakness. These symptoms may be an emergency. Do not wait to see if the symptoms will go away. Get medical help right away. Call your local emergency services (911 in the [place] Do not drive yourself to the hospital. Summary  Nausea is feeling sick to your  stomach or feeling that you are about to throw up (vomit). Vomiting is when food in your stomach is thrown up and out of the mouth.  Follow instructions from your doctor about eating and drinking to keep from losing too much water in your body.  Take over-the-counter and prescription medicines only as told by your doctor.  Contact your doctor if your symptoms get worse or you have new symptoms.  Keep all follow-up visits as told by your doctor. This is important. This information is not intended to replace advice given to you by your health care provider. Make sure you discuss any questions you have with your health care provider. Document Revised: 09/15/2018 Document Reviewed: 11/01/2017 Elsevier Patient Education  2020 [employer].
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
## 2019-10-04 ENCOUNTER — Ambulatory Visit (HOSPITAL_COMMUNITY)
Admission: RE | Admit: 2019-10-04 | Discharge: 2019-10-04 | Disposition: A | Discharge status: Home / Self Care | Payer: No Typology Code available for payment source | Source: Ambulatory Visit | Attending: Nurse Practitioner | Admitting: Nurse Practitioner

## 2019-10-04 ENCOUNTER — Other Ambulatory Visit: Payer: Self-pay

## 2019-10-04 DIAGNOSIS — M5441 Lumbago with sciatica, right side: Secondary | ICD-10-CM | POA: Insufficient documentation

## 2019-10-04 DIAGNOSIS — G8929 Other chronic pain: Secondary | ICD-10-CM | POA: Insufficient documentation

## 2019-10-04 DIAGNOSIS — M5442 Lumbago with sciatica, left side: Secondary | ICD-10-CM | POA: Insufficient documentation

## 2019-10-04 LAB — LIPID PANEL
Chol/HDL Ratio: 2.9 ratio (ref 0.0–4.4)
Cholesterol, Total: 160 mg/dL (ref 100–199)
HDL: 56 mg/dL (ref >39)
LDL Chol Calc (NIH): 89 mg/dL (ref 0–99)
Triglycerides: 77 mg/dL (ref 0–149)
VLDL Cholesterol Cal: 15 mg/dL (ref 5–40)

## 2019-10-04 LAB — CBC WITH DIFFERENTIAL/PLATELET
Basophils Absolute: 0.1 10*3/uL (ref 0.0–0.2)
Basos: 1 %
EOS (ABSOLUTE): 0.2 10*3/uL (ref 0.0–0.4)
Eos: 3 %
Hematocrit: 40.6 % (ref 34.0–46.6)
Hemoglobin: 13.8 g/dL (ref 11.1–15.9)
Immature Grans (Abs): 0 10*3/uL (ref 0.0–0.1)
Immature Granulocytes: 1 %
Lymphocytes Absolute: 2.2 10*3/uL (ref 0.7–3.1)
Lymphs: 33 %
MCH: 31.2 pg (ref 26.6–33.0)
MCHC: 34 g/dL (ref 31.5–35.7)
MCV: 92 fL (ref 79–97)
Monocytes Absolute: 0.6 10*3/uL (ref 0.1–0.9)
Monocytes: 8 %
Neutrophils Absolute: 3.6 10*3/uL (ref 1.4–7.0)
Neutrophils: 54 %
Platelets: 377 10*3/uL (ref 150–450)
RBC: 4.43 x10E6/uL (ref 3.77–5.28)
RDW: 13 % (ref 11.7–15.4)
WBC: 6.6 10*3/uL (ref 3.4–10.8)

## 2019-10-04 LAB — COMP. METABOLIC PANEL (12)
AST: 12 IU/L (ref 0–40)
Albumin/Globulin Ratio: 1.3 (ref 1.2–2.2)
Albumin: 4.3 g/dL (ref 3.8–4.8)
Alkaline Phosphatase: 137 IU/L — ABNORMAL HIGH (ref 39–117)
BUN/Creatinine Ratio: 22 (ref 9–23)
BUN: 14 mg/dL (ref 6–24)
Bilirubin Total: <0.2 mg/dL (ref 0.0–1.2)
Calcium: 9.8 mg/dL (ref 8.7–10.2)
Chloride: 101 mmol/L (ref 96–106)
Creatinine, Ser: 0.63 mg/dL (ref 0.57–1.00)
GFR calc Af Amer: 124 mL/min/{1.73_m2} (ref >59)
GFR calc non Af Amer: 107 mL/min/{1.73_m2} (ref >59)
Globulin, Total: 3.2 g/dL (ref 1.5–4.5)
Glucose: 71 mg/dL (ref 65–99)
Potassium: 4.3 mmol/L (ref 3.5–5.2)
Sodium: 137 mmol/L (ref 134–144)
Total Protein: 7.5 g/dL (ref 6.0–8.5)

## 2019-10-04 LAB — TSH: TSH: 1.66 u[IU]/mL (ref 0.450–4.500)

## 2019-10-11 LAB — SPECIMEN STATUS REPORT

## 2019-10-11 LAB — H. PYLORI BREATH TEST: H pylori Breath Test: NEGATIVE

## 2019-10-11 LAB — H. PYLORI BREATH COLLECTION

## 2019-11-05 ENCOUNTER — Other Ambulatory Visit: Payer: Self-pay | Admitting: Family Medicine

## 2019-11-05 DIAGNOSIS — I10 Essential (primary) hypertension: Secondary | ICD-10-CM

## 2019-11-05 DIAGNOSIS — R000 Tachycardia, unspecified: Secondary | ICD-10-CM

## 2019-11-07 ENCOUNTER — Emergency Department (HOSPITAL_COMMUNITY)
Admission: EM | Admit: 2019-11-07 | Discharge: 2019-11-08 | Disposition: A | Discharge status: Home / Self Care | Payer: No Typology Code available for payment source | Attending: Emergency Medicine | Admitting: Emergency Medicine

## 2019-11-07 ENCOUNTER — Other Ambulatory Visit: Payer: Self-pay

## 2019-11-07 ENCOUNTER — Emergency Department (HOSPITAL_COMMUNITY): Payer: No Typology Code available for payment source

## 2019-11-07 DIAGNOSIS — R05 Cough: Secondary | ICD-10-CM | POA: Insufficient documentation

## 2019-11-07 DIAGNOSIS — Z5321 Procedure and treatment not carried out due to patient leaving prior to being seen by health care provider: Secondary | ICD-10-CM | POA: Insufficient documentation

## 2019-11-07 DIAGNOSIS — M545 Low back pain: Secondary | ICD-10-CM | POA: Insufficient documentation

## 2019-11-07 DIAGNOSIS — R0789 Other chest pain: Secondary | ICD-10-CM | POA: Insufficient documentation

## 2019-11-07 LAB — CBC
HCT: 42.3 % (ref 36.0–46.0)
Hemoglobin: 13.8 g/dL (ref 12.0–15.0)
MCH: 30.3 pg (ref 26.0–34.0)
MCHC: 32.6 g/dL (ref 30.0–36.0)
MCV: 92.8 fL (ref 80.0–100.0)
Platelets: 506 10*3/uL — ABNORMAL HIGH (ref 150–400)
RBC: 4.56 MIL/uL (ref 3.87–5.11)
RDW: 13 % (ref 11.5–15.5)
WBC: 9.2 10*3/uL (ref 4.0–10.5)
nRBC: 0 % (ref 0.0–0.2)

## 2019-11-07 LAB — BASIC METABOLIC PANEL
Anion gap: 13 (ref 5–15)
BUN: 10 mg/dL (ref 6–20)
CO2: 22 mmol/L (ref 22–32)
Calcium: 8.9 mg/dL (ref 8.9–10.3)
Chloride: 101 mmol/L (ref 98–111)
Creatinine, Ser: 0.62 mg/dL (ref 0.44–1.00)
GFR calc Af Amer: >60 mL/min (ref >60)
GFR calc non Af Amer: >60 mL/min (ref >60)
Glucose, Bld: 86 mg/dL (ref 70–99)
Potassium: 3.9 mmol/L (ref 3.5–5.1)
Sodium: 136 mmol/L (ref 135–145)

## 2019-11-07 LAB — I-STAT BETA HCG BLOOD, ED (NOT ORDERABLE): I-stat hCG, quantitative: 12.6 m[IU]/mL — ABNORMAL HIGH (ref <5)

## 2019-11-07 LAB — TROPONIN I (HIGH SENSITIVITY): Troponin I (High Sensitivity): <2 ng/L (ref <18)

## 2019-11-07 MED ORDER — SODIUM CHLORIDE 0.9% FLUSH: 3.0000 mL | Freq: Once | INTRAVENOUS | Status: DC

## 2019-11-07 NOTE — ED Triage Notes (Signed)
Arrived POV patient reports cough and, chest tightness, and left lower back pain that goes up into her chest.

## 2019-11-08 ENCOUNTER — Encounter (HOSPITAL_COMMUNITY): Payer: Self-pay

## 2019-11-08 ENCOUNTER — Other Ambulatory Visit: Payer: Self-pay

## 2019-11-08 ENCOUNTER — Emergency Department (HOSPITAL_COMMUNITY)
Admission: EM | Admit: 2019-11-08 | Discharge: 2019-11-08 | Disposition: A | Discharge status: Home / Self Care | Payer: No Typology Code available for payment source | Attending: Emergency Medicine | Admitting: Emergency Medicine

## 2019-11-08 ENCOUNTER — Emergency Department (HOSPITAL_COMMUNITY): Payer: No Typology Code available for payment source

## 2019-11-08 DIAGNOSIS — F17290 Nicotine dependence, other tobacco product, uncomplicated: Secondary | ICD-10-CM | POA: Insufficient documentation

## 2019-11-08 DIAGNOSIS — J189 Pneumonia, unspecified organism: Secondary | ICD-10-CM | POA: Insufficient documentation

## 2019-11-08 DIAGNOSIS — E049 Nontoxic goiter, unspecified: Secondary | ICD-10-CM | POA: Insufficient documentation

## 2019-11-08 DIAGNOSIS — I10 Essential (primary) hypertension: Secondary | ICD-10-CM | POA: Insufficient documentation

## 2019-11-08 DIAGNOSIS — J90 Pleural effusion, not elsewhere classified: Secondary | ICD-10-CM

## 2019-11-08 DIAGNOSIS — Z79899 Other long term (current) drug therapy: Secondary | ICD-10-CM | POA: Insufficient documentation

## 2019-11-08 DIAGNOSIS — Z20822 Contact with and (suspected) exposure to covid-19: Secondary | ICD-10-CM | POA: Insufficient documentation

## 2019-11-08 DIAGNOSIS — R0602 Shortness of breath: Secondary | ICD-10-CM | POA: Insufficient documentation

## 2019-11-08 LAB — COMPREHENSIVE METABOLIC PANEL
ALT: 9 U/L (ref 0–44)
AST: 19 U/L (ref 15–41)
Albumin: 2.8 g/dL — ABNORMAL LOW (ref 3.5–5.0)
Alkaline Phosphatase: 92 U/L (ref 38–126)
Anion gap: 14 (ref 5–15)
BUN: 7 mg/dL (ref 6–20)
CO2: 21 mmol/L — ABNORMAL LOW (ref 22–32)
Calcium: 8.8 mg/dL — ABNORMAL LOW (ref 8.9–10.3)
Chloride: 101 mmol/L (ref 98–111)
Creatinine, Ser: 0.66 mg/dL (ref 0.44–1.00)
GFR calc Af Amer: >60 mL/min (ref >60)
GFR calc non Af Amer: >60 mL/min (ref >60)
Glucose, Bld: 86 mg/dL (ref 70–99)
Potassium: 4 mmol/L (ref 3.5–5.1)
Sodium: 136 mmol/L (ref 135–145)
Total Bilirubin: 0.7 mg/dL (ref 0.3–1.2)
Total Protein: 7.3 g/dL (ref 6.5–8.1)

## 2019-11-08 LAB — I-STAT VENOUS BLOOD GAS, ED
Acid-Base Excess: 1 mmol/L (ref 0.0–2.0)
Bicarbonate: 24.8 mmol/L (ref 20.0–28.0)
Calcium, Ion: 1.07 mmol/L — ABNORMAL LOW (ref 1.15–1.40)
HCT: 43 % (ref 36.0–46.0)
Hemoglobin: 14.6 g/dL (ref 12.0–15.0)
O2 Saturation: 99 %
Potassium: 3.7 mmol/L (ref 3.5–5.1)
Sodium: 137 mmol/L (ref 135–145)
TCO2: 26 mmol/L (ref 22–32)
pCO2, Ven: 34.7 mmHg — ABNORMAL LOW (ref 44.0–60.0)
pH, Ven: 7.463 — ABNORMAL HIGH (ref 7.250–7.430)
pO2, Ven: 143 mmHg — ABNORMAL HIGH (ref 32.0–45.0)

## 2019-11-08 LAB — CBC WITH DIFFERENTIAL/PLATELET
Abs Immature Granulocytes: 0.03 10*3/uL (ref 0.00–0.07)
Basophils Absolute: 0.1 10*3/uL (ref 0.0–0.1)
Basophils Relative: 1 %
Eosinophils Absolute: 0.3 10*3/uL (ref 0.0–0.5)
Eosinophils Relative: 3 %
HCT: 40.7 % (ref 36.0–46.0)
Hemoglobin: 13.1 g/dL (ref 12.0–15.0)
Immature Granulocytes: 0 %
Lymphocytes Relative: 19 %
Lymphs Abs: 1.7 10*3/uL (ref 0.7–4.0)
MCH: 30 pg (ref 26.0–34.0)
MCHC: 32.2 g/dL (ref 30.0–36.0)
MCV: 93.3 fL (ref 80.0–100.0)
Monocytes Absolute: 1.3 10*3/uL — ABNORMAL HIGH (ref 0.1–1.0)
Monocytes Relative: 14 %
Neutro Abs: 5.8 10*3/uL (ref 1.7–7.7)
Neutrophils Relative %: 63 %
Platelets: 455 10*3/uL — ABNORMAL HIGH (ref 150–400)
RBC: 4.36 MIL/uL (ref 3.87–5.11)
RDW: 12.8 % (ref 11.5–15.5)
WBC: 9.2 10*3/uL (ref 4.0–10.5)
nRBC: 0 % (ref 0.0–0.2)

## 2019-11-08 LAB — TROPONIN I (HIGH SENSITIVITY): Troponin I (High Sensitivity): <2 ng/L (ref <18)

## 2019-11-08 LAB — LACTIC ACID, PLASMA: Lactic Acid, Venous: 1.3 mmol/L (ref 0.5–1.9)

## 2019-11-08 LAB — I-STAT BETA HCG BLOOD, ED (MC, WL, AP ONLY): I-stat hCG, quantitative: <5 m[IU]/mL (ref <5)

## 2019-11-08 LAB — D-DIMER, QUANTITATIVE: D-Dimer, Quant: 4.61 ug/mL-FEU — ABNORMAL HIGH (ref 0.00–0.50)

## 2019-11-08 LAB — SARS CORONAVIRUS 2 BY RT PCR (HOSPITAL ORDER, PERFORMED IN ~~LOC~~ HOSPITAL LAB): SARS Coronavirus 2: NEGATIVE

## 2019-11-08 MED ORDER — BENZONATATE 100 MG PO CAPS: 100.0000 mg | ORAL_CAPSULE | Freq: Three times a day (TID) | ORAL | 0 refills | Status: AC

## 2019-11-08 MED ORDER — ALBUTEROL SULFATE HFA 108 (90 BASE) MCG/ACT IN AERS
2.0000 | INHALATION_SPRAY | Freq: Once | RESPIRATORY_TRACT | Status: AC
  Administered 2019-11-08: 2 via RESPIRATORY_TRACT
  Filled 2019-11-08: qty 6.7

## 2019-11-08 MED ORDER — DOXYCYCLINE HYCLATE 100 MG PO CAPS: 100.0000 mg | ORAL_CAPSULE | Freq: Two times a day (BID) | ORAL | 0 refills | Status: AC

## 2019-11-08 MED ORDER — SODIUM CHLORIDE 0.9 % IV SOLN
1.0000 g | Freq: Once | INTRAVENOUS | Status: AC
  Administered 2019-11-08: 1 g via INTRAVENOUS
  Filled 2019-11-08: qty 10

## 2019-11-08 MED ORDER — ACETAMINOPHEN 325 MG PO TABS
650.0000 mg | ORAL_TABLET | Freq: Once | ORAL | Status: AC
  Administered 2019-11-08: 650 mg via ORAL
  Filled 2019-11-08: qty 2

## 2019-11-08 MED ORDER — SODIUM CHLORIDE 0.9 % IV SOLN
500.0000 mg | Freq: Once | INTRAVENOUS | Status: AC
  Administered 2019-11-08: 500 mg via INTRAVENOUS
  Filled 2019-11-08: qty 500

## 2019-11-08 MED ORDER — CEFDINIR 300 MG PO CAPS: 300.0000 mg | ORAL_CAPSULE | Freq: Two times a day (BID) | ORAL | 0 refills | Status: AC

## 2019-11-08 MED ORDER — IOHEXOL 350 MG/ML SOLN
75.0000 mL | Freq: Once | INTRAVENOUS | Status: AC | PRN
  Administered 2019-11-08: 75 mL via INTRAVENOUS

## 2019-11-08 NOTE — ED Notes (Signed)
1st Lactic Acid normal 2nd d/c.

## 2019-11-08 NOTE — ED Provider Notes (Signed)
[place] EMERGENCY DEPARTMENT Provider Note   CSN: [id number] Arrival date & time: 11/08/19  1124     History Chief Complaint  Patient presents with  . Chest Pain  . Shortness of Breath    Jennifer Forbes is a 49 y.o. female.  HPI   Pt is a 49 y/o female with a h/o arthritis, HTN, who presents to the ED today for eval of 9/10 left chest pain described as pressure that started about 1 week ago. Pain also located to the left back/flank area. Pain has been constant since onset. She also reports rhinorrhea and a productive cough with white sputum production. Denies hemoptysis. She reports pleuritic pain and shortness of breath as well. Denies fevers or any known COVID contacts. Denies NVD or abd pain. Denies BLE swelling or calf pain. Denies recent surgery/trauma, recent long travel, hormone use, personal hx of cancer, or hx of DVT/PE.   LMP 1 year ago. Uses tobacco.  Past Medical History:  Diagnosis Date  . Arthritis    Left knee  . Hypertension     Patient Active Problem List   Diagnosis Date Noted  . Abnormal EKG 05/09/2017  . Tachycardia 05/09/2017  . Essential hypertension 10/24/2015  . Rheumatoid arthritis involving left knee ([place]) 10/24/2015  . Gastroesophageal reflux disease without esophagitis 10/24/2015  . Tobacco dependence 10/24/2015  . Morbid obesity ([place]) 10/24/2015    Past Surgical History:  Procedure Laterality Date  . BREAST BIOPSY  2015   left breast  . LIPOMA RESECTION  march 2014   right shoulder   . SKIN BIOPSY       OB History    Gravida  2   Para  1   Term  1   Preterm      AB  1   Living  1     SAB  1   TAB      Ectopic      Multiple      Live Births  1           Family History  Problem Relation Age of Onset  . Hypertension Mother   . Breast cancer Sister   . Diabetes Brother   . Breast cancer Maternal Aunt   . Diabetes Maternal Uncle     Social History   Tobacco Use  . Smoking status:  Current Every Day Smoker    Packs/day: 0.25    Types: Cigars  . Smokeless tobacco: Never Used  . Tobacco comment: black and mild   Substance Use Topics  . Alcohol use: No  . Drug use: Not Currently    Home Medications Prior to Admission medications   Medication Sig Start Date End Date Taking? Authorizing Provider  amLODipine (NORVASC) 5 MG tablet TAKE 1 TABLET BY MOUTH EVERY DAY 11/06/19  Yes Vevelyn Francois, NP  methocarbamol (ROBAXIN) 500 MG tablet Take 1 tablet (500 mg total) by mouth at bedtime as needed for muscle spasms. 10/03/19  Yes Vevelyn Francois, NP  metoprolol succinate (TOPROL-XL) 25 MG 24 hr tablet TAKE 1/2 TABLET BY MOUTH EVERY DAY 11/06/19  Yes Vevelyn Francois, NP  pantoprazole (PROTONIX) 40 MG tablet Take 1 tablet (40 mg total) by mouth daily. 10/03/19  Yes Vevelyn Francois, NP  benzonatate (TESSALON) 100 MG capsule Take 1 capsule (100 mg total) by mouth every 8 (eight) hours for 5 days. 11/08/19 11/13/19  Price Lachapelle S, PA-C  cefdinir (OMNICEF) 300 MG capsule Take 1  capsule (300 mg total) by mouth 2 (two) times daily for 5 days. 11/08/19 11/13/19  Tonya Carlile S, PA-C  doxycycline (VIBRAMYCIN) 100 MG capsule Take 1 capsule (100 mg total) by mouth 2 (two) times daily for 5 days. 11/08/19 11/13/19  Saheed Carrington S, PA-C  naproxen (NAPROSYN) 500 MG tablet TAKE 1 TABLET (500 MG TOTAL) BY MOUTH 2 (TWO) TIMES DAILY WITH A MEAL. Patient not taking: Reported on 11/08/2019 03/05/19   Quentin Angst, MD    Allergies    Amoxicillin  Review of Systems   Review of Systems  Constitutional: Negative for fever.  HENT: Positive for rhinorrhea. Negative for ear pain and sore throat.   Eyes: Negative for pain and visual disturbance.  Respiratory: Positive for cough, shortness of breath and wheezing.   Cardiovascular: Positive for chest pain. Negative for leg swelling.  Gastrointestinal: Negative for abdominal pain, constipation, diarrhea, nausea and vomiting.  Genitourinary: Negative for  dysuria and hematuria.  Musculoskeletal: Negative for back pain.  Skin: Negative for color change and rash.  Neurological: Negative for headaches.  All other systems reviewed and are negative.   Physical Exam Updated Vital Signs BP 117/75 (BP Location: Left Arm)   Pulse 96   Temp 98.6 F (37 C) (Oral)   Resp 18   LMP 01/20/2019   SpO2 97%   Physical Exam Vitals and nursing note reviewed.  Constitutional:      General: She is not in acute distress.    Appearance: She is well-developed.  HENT:     Head: Normocephalic and atraumatic.  Eyes:     Conjunctiva/sclera: Conjunctivae normal.  Cardiovascular:     Rate and Rhythm: Normal rate and regular rhythm.     Heart sounds: No murmur.  Pulmonary:     Effort: Pulmonary effort is normal. No respiratory distress.     Breath sounds: Examination of the right-middle field reveals decreased breath sounds. Examination of the right-lower field reveals decreased breath sounds. Decreased breath sounds present. No wheezing or rhonchi.  Chest:     Chest wall: Tenderness (left chest wall) present.  Abdominal:     Palpations: Abdomen is soft.     Tenderness: There is no abdominal tenderness. There is left CVA tenderness. There is no right CVA tenderness.  Musculoskeletal:     Cervical back: Neck supple.     Right lower leg: No tenderness. No edema.     Left lower leg: No edema.  Skin:    General: Skin is warm and dry.  Neurological:     Mental Status: She is alert.     ED Results / Procedures / Treatments   Labs (all labs ordered are listed, but only abnormal results are displayed) Labs Reviewed  COMPREHENSIVE METABOLIC PANEL - Abnormal; Notable for the following components:      Result Value   CO2 21 (*)    Calcium 8.8 (*)    Albumin 2.8 (*)    All other components within normal limits  CBC WITH DIFFERENTIAL/PLATELET - Abnormal; Notable for the following components:   Platelets 455 (*)    Monocytes Absolute 1.3 (*)    All  other components within normal limits  D-DIMER, QUANTITATIVE (NOT AT [facility]) - Abnormal; Notable for the following components:   D-Dimer, Quant 4.61 (*)    All other components within normal limits  I-STAT VENOUS BLOOD GAS, ED - Abnormal; Notable for the following components:   pH, Ven 7.463 (*)    pCO2, Ven 34.7 (*)  pO2, Ven 143.0 (*)    Calcium, Ion 1.07 (*)    All other components within normal limits  CULTURE, BLOOD (ROUTINE X 2)  CULTURE, BLOOD (ROUTINE X 2)  SARS CORONAVIRUS 2 BY RT PCR (HOSPITAL ORDER, PERFORMED IN [facility])  LACTIC ACID, PLASMA  I-STAT BETA HCG BLOOD, ED ([facility], [facility], [facility] ONLY)  TROPONIN I (HIGH SENSITIVITY)    EKG None  Radiology DG Chest 2 View  Result Date: 11/07/2019 CLINICAL DATA:  Cough and chest tightness radiating to the left back. EXAM: CHEST - 2 VIEW COMPARISON:  None. FINDINGS: Moderate to moderately large left pleural effusion and basilar airspace disease are seen. The right lung is clear. Cardiac silhouette is obscured. No pneumothorax. IMPRESSION: Moderate to moderately large left pleural effusion and associated airspace disease which could be atelectasis or pneumonia. Electronically Signed   By: Drusilla Kanner M.D.   On: 11/07/2019 19:54   CT Angio Chest PE W and/or Wo Contrast  Result Date: 11/08/2019 CLINICAL DATA:  Left-sided chest pain and shortness of breath EXAM: CT ANGIOGRAPHY CHEST WITH CONTRAST TECHNIQUE: Multidetector CT imaging of the chest was performed using the standard protocol during bolus administration of intravenous contrast. Multiplanar CT image reconstructions and MIPs were obtained to evaluate the vascular anatomy. CONTRAST:  72mL OMNIPAQUE IOHEXOL 350 MG/ML SOLN COMPARISON:  11/07/2019 FINDINGS: Cardiovascular: Thoracic aorta is branches are within normal limits. No aneurysmal dilatation or dissection is seen. No cardiac enlargement is noted. Pulmonary artery is well visualized with a normal branching pattern. No  definitive filling defects to suggest pulmonary embolism are seen. No significant coronary calcifications are noted. Mediastinum/Nodes: Thoracic inlet demonstrates the thyroid to be enlarged bilaterally with diffuse heterogeneity. No discrete nodule is seen on this exam. Scattered small hilar and mediastinal lymph nodes are noted likely reactive in nature. The esophagus as visualized is within normal limits. Lungs/Pleura: Large left pleural effusion is noted similar to that seen on previous chest x-ray. Considerable consolidation of the left lower lobe is noted. The left upper lobe is predominately well aerated with some very mild atelectatic changes. No discrete mass is noted. The right lung demonstrates emphysematous change without focal infiltrate. No sizable parenchymal nodule is noted. Upper Abdomen: Visualized upper abdomen is within normal limits. Excretion of contrast is noted from both kidneys. Musculoskeletal: Degenerative changes of the thoracic spine are noted. No acute bony abnormality is seen. Review of the MIP images confirms the above findings. IMPRESSION: Large left-sided pleural effusion with lower lobe consolidation and left upper lobe atelectatic changes. No discrete mass is noted. Re-evaluation may be helpful following thoracentesis. No evidence of pulmonary emboli. Enlarged and heterogeneous thyroid gland as described. Recommend thyroid ultrasound (ref: J Am Coll Radiol. 2015 Feb;12(2): 143-50). Emphysema (ICD10-J43.9). Electronically Signed   By: Alcide Clever M.D.   On: 11/08/2019 20:56    Procedures Procedures (including critical care time)  Medications Ordered in ED Medications  albuterol (VENTOLIN HFA) 108 (90 Base) MCG/ACT inhaler 2 puff (2 puffs Inhalation Given 11/08/19 1838)  acetaminophen (TYLENOL) tablet 650 mg (650 mg Oral Given 11/08/19 1838)  cefTRIAXone (ROCEPHIN) 1 g in sodium chloride 0.9 % 100 mL IVPB (0 g Intravenous Stopped 11/08/19 1921)  azithromycin (ZITHROMAX) 500 mg  in sodium chloride 0.9 % 250 mL IVPB (0 mg Intravenous Stopped 11/08/19 2045)  iohexol (OMNIPAQUE) 350 MG/ML injection 75 mL (75 mLs Intravenous Contrast Given 11/08/19 2019)    ED Course  I have reviewed the triage vital signs and the nursing  notes.  Pertinent labs & imaging results that were available during my care of the patient were reviewed by me and considered in my medical decision making (see chart for details).    MDM Rules/Calculators/A&P                      49 year old female with cough, left-sided chest pain shortness of breath for 1 week.  Had a chest x-ray yesterday which showed a moderate to large left pleural effusion with associated airspace disease.  Returns today for persistent symptoms.  Also complaining of pleuritic pain.  Reviewed/interpreted labs CBC without leukocytosis, thrombocytosis present CMP with normal electrolytes, normal liver and kidney function Beta-hCG negative Troponin negative Lactic acid negative Blood cultures were obtained D-dimer positive, will proceed with CTA chest COVID pending at discharge.  CTA of the chest did not show any evidence of pulmonary embolus.  Did show large pleural effusion with left lung consolidation.  Also with emphysema and enlarged thyroid.  Patient was given IV fluids, albuterol, Tylenol and ceftriaxone/azithromycin in the ED.  On reassessment she is in no acute distress.  She has had normal respiratory status throughout her ED visit.  No evidence of hypoxia.  No tachypnea.  Discussed results of work-up and plan for discharge on antibiotics, cough medication and albuterol.  Feel that she will not require admission at this time but she will need close follow-up with her PCP to ensure resolution of the pleural effusion.  Also advise she needs follow-up at the enlarged thyroid.  Advised on specific return precautions.  She voiced understanding plan reasons return for all questions answered.  Patient stable for  discharge.  ----  [name] was evaluated in Emergency Department on 11/08/2019 for the symptoms described in the history of present illness. She was evaluated in the context of the global COVID-19 pandemic, which necessitated consideration that the patient might be at risk for infection with the SARS-CoV-2 virus that causes COVID-19. Institutional protocols and algorithms that pertain to the evaluation of patients at risk for COVID-19 are in a state of rapid change based on information released by regulatory bodies including the CDC and federal and state organizations. These policies and algorithms were followed during the patient's care in the ED.   Final Clinical Impression(s) / ED Diagnoses Final diagnoses:  Community acquired pneumonia of left lung, unspecified part of lung  Pleural effusion  Thyroid enlargement    Rx / DC Orders ED Discharge Orders         Ordered    doxycycline (VIBRAMYCIN) 100 MG capsule  2 times daily     11/08/19 2115    cefdinir (OMNICEF) 300 MG capsule  2 times daily     11/08/19 2115    benzonatate (TESSALON) 100 MG capsule  Every 8 hours     11/08/19 2121           Karrie Meres, PA-C 11/08/19 2138    Mancel Bale, MD 11/08/19 2329

## 2019-11-08 NOTE — Discharge Instructions (Addendum)
You were given a prescription for antibiotics. Please take the antibiotic prescription fully. You were also given a prescription for cough medication.   You also had an enlarged thyroid today on your CT scan and you will need to have a thyroid ultrasound completed as an outpatient.   Please follow up with your primary care provider within 5-7 days for re-evaluation of your symptoms. If you do not have a primary care provider, information for a healthcare clinic has been provided for you to make arrangements for follow up care. Please return to the emergency department for any new or worsening symptoms.

## 2019-11-08 NOTE — ED Triage Notes (Signed)
Pt reports chest pain that radiates to her back and SOB since last week, pt sen at [facility] last night but LWBS after triage. Pt states they told her she has pneumonia. Pt also reports cough. resp e.u at this time.

## 2019-11-12 ENCOUNTER — Other Ambulatory Visit: Payer: Self-pay

## 2019-11-12 ENCOUNTER — Ambulatory Visit (INDEPENDENT_AMBULATORY_CARE_PROVIDER_SITE_OTHER): Payer: Self-pay | Admitting: Nurse Practitioner

## 2019-11-12 VITALS — HR 110 | Temp 98.0°F

## 2019-11-12 DIAGNOSIS — R059 Cough, unspecified: Secondary | ICD-10-CM

## 2019-11-12 DIAGNOSIS — J189 Pneumonia, unspecified organism: Secondary | ICD-10-CM

## 2019-11-12 DIAGNOSIS — R05 Cough: Secondary | ICD-10-CM

## 2019-11-12 DIAGNOSIS — R0602 Shortness of breath: Secondary | ICD-10-CM

## 2019-11-12 MED ORDER — PREDNISONE 20 MG PO TABS: ORAL_TABLET | ORAL | 0 refills | Status: AC

## 2019-11-12 NOTE — Progress Notes (Addendum)
   [facility] Patient [facility] [address] Jennifer Forbes Jennifer Forbes, [place]  [address] Phone:  [phone]   Fax:  [phone]  Patient location home Provider location office Virtual Visit via Telephone Note  I connected with Jennifer Forbes on 11/12/19 at 10:10 AM EDT by telephone and verified that I am speaking with the correct person using two identifiers.   I discussed the limitations, risks, security and privacy concerns of performing an evaluation and management service by telephone and the availability of in person appointments. I also discussed with the patient that there may be a patient responsible charge related to this service. The patient expressed understanding and agreed to proceed.   History of Present Illness:  Upper Respiratory Infection Patient complains of symptoms of a URI, follow up on a URI. Symptoms include shortness of breath cough and decreased appetite. Onset of symptoms was 4 days ago, and has been gradually improving since that time. She was seen in the ER at [facility] and [facility]. She was diagnosed with PNX. She is curently being treated with Omincef and Doxycycline. Treatment to date: antibiotics and cough suppressants. She admits that the chest pressure is relieved however she continues to have cough and some shortness of breath.  She has no desire to return to the emergency room at this time.  Observations/Objective: Shortness of breath noted at beginning interview.  Patient able to speak clearly by the interview.  Assessment and Plan:  Assessment  Primary Diagnosis & Pertinent Problem List: The primary encounter diagnosis was Shortness of breath. Diagnoses of Pneumonia of left lower lobe due to infectious organism and Cough were also pertinent to this visit.  Visit Diagnosis: 1. Shortness of breath   2. Pneumonia of left lower lobe due to infectious organism   3. Cough    Plan of Care  Pharmacotherapy (Medications Ordered): Meds ordered this encounter  Medications  .  predniSONE (DELTASONE) 20 MG tablet    Sig: Take 3 tablets (60 mg total) by mouth daily with breakfast for 3 days, THEN 2 tablets (40 mg total) daily with breakfast for 3 days, THEN 1 tablet (20 mg total) daily with breakfast for 3 days.    Dispense:  18 tablet    Refill:  0    Order Specific Question:   Supervising Provider    Answer:   Quentin Angst [[id number]]   New Prescriptions   PREDNISONE (DELTASONE) 20 MG TABLET    Take 3 tablets (60 mg total) by mouth daily with breakfast for 3 days, THEN 2 tablets (40 mg total) daily with breakfast for 3 days, THEN 1 tablet (20 mg total) daily with breakfast for 3 days.    Future Appointments  Date Time Provider Department Center  01/02/2020  9:20 AM Barbette Merino, NP [facility] None   Follow Up Instructions: Will call tomorrow for follow up.  Encourage patient if symptoms persist or get worse to return to the emergency room immediately.   I discussed the assessment and treatment plan with the patient. The patient was provided an opportunity to ask questions and all were answered. The patient agreed with the plan and demonstrated an understanding of the instructions.   The patient was advised to call back or seek an in-person evaluation if the symptoms worsen or if the condition fails to improve as anticipated.  I provided 10 minutes of non-face-to-face time during this encounter.   Barbette Merino, NP

## 2019-11-13 LAB — CULTURE, BLOOD (ROUTINE X 2)
Culture: NO GROWTH
Special Requests: ADEQUATE

## 2019-11-23 ENCOUNTER — Other Ambulatory Visit: Payer: Self-pay | Admitting: Nurse Practitioner

## 2019-11-23 DIAGNOSIS — E049 Nontoxic goiter, unspecified: Secondary | ICD-10-CM

## 2019-11-28 ENCOUNTER — Other Ambulatory Visit: Payer: Self-pay | Admitting: Nurse Practitioner

## 2019-11-28 ENCOUNTER — Other Ambulatory Visit: Payer: Self-pay

## 2019-11-28 ENCOUNTER — Ambulatory Visit (HOSPITAL_COMMUNITY)
Admission: RE | Admit: 2019-11-28 | Discharge: 2019-11-28 | Disposition: A | Discharge status: Home / Self Care | Payer: Self-pay | Source: Ambulatory Visit | Attending: Nurse Practitioner | Admitting: Nurse Practitioner

## 2019-11-28 DIAGNOSIS — E049 Nontoxic goiter, unspecified: Secondary | ICD-10-CM | POA: Insufficient documentation

## 2019-11-29 ENCOUNTER — Other Ambulatory Visit: Payer: Self-pay | Admitting: Nurse Practitioner

## 2019-11-29 DIAGNOSIS — E042 Nontoxic multinodular goiter: Secondary | ICD-10-CM

## 2019-11-29 NOTE — Progress Notes (Unsigned)
   [facility] Patient [facility] [address] Anastasia Pall [place], [place]  [address]                                                               Phone:  [phone]   Fax:  [phone]  Thyroid Patient presents for evaluation of hypothyroidism and multiple thyroid nodules.  Patient denies denies fatigue, weight changes, heat/cold intolerance, bowel/skin changes or CVS symptoms.  On 11/07/2019 she diagnosed with community-acquired pneumonia via CT scan of her chest.  The radiologist noted that the thoracic island demonstrates that the thyroid was enlarged bilaterally with diffuse heterogeneity.  The recommendation was for further evaluation with an ultrasound.  The ultrasound indicated thyromegaly with findings suggesting a multinodular goiter and there was recommendation for further evaluation of nodules 2 and 4 with percutaneous sampling.

## 2019-11-29 NOTE — Telephone Encounter (Signed)
Pt was was set up for study and pt was contacted .

## 2019-11-30 ENCOUNTER — Other Ambulatory Visit: Payer: Self-pay | Admitting: Nurse Practitioner

## 2019-11-30 DIAGNOSIS — E042 Nontoxic multinodular goiter: Secondary | ICD-10-CM

## 2019-12-03 ENCOUNTER — Other Ambulatory Visit: Payer: Self-pay

## 2019-12-03 DIAGNOSIS — Z1231 Encounter for screening mammogram for malignant neoplasm of breast: Secondary | ICD-10-CM

## 2019-12-05 ENCOUNTER — Telehealth: Payer: Self-pay | Admitting: Nurse Practitioner

## 2019-12-05 NOTE — Telephone Encounter (Signed)
Dr. Myrtis Hopping office (ENT doctor) closed the referral because they dont file/accept the patients insurance.

## 2019-12-11 NOTE — Telephone Encounter (Signed)
Forwarding

## 2019-12-18 NOTE — Telephone Encounter (Signed)
Patient has family planning until she gets some type of insurance coverage she won't be able to see specialist. Unless she goes as self pay.

## 2020-01-02 ENCOUNTER — Ambulatory Visit: Payer: No Typology Code available for payment source | Admitting: Nurse Practitioner

## 2020-01-03 ENCOUNTER — Ambulatory Visit: Payer: No Typology Code available for payment source

## 2020-08-04 ENCOUNTER — Emergency Department (HOSPITAL_COMMUNITY): Payer: Medicaid Other

## 2020-08-04 ENCOUNTER — Emergency Department (HOSPITAL_COMMUNITY)
Admission: EM | Admit: 2020-08-04 | Discharge: 2020-08-04 | Disposition: A | Discharge status: Home / Self Care | Payer: Medicaid Other | Attending: Emergency Medicine | Admitting: Emergency Medicine

## 2020-08-04 ENCOUNTER — Other Ambulatory Visit: Payer: Self-pay

## 2020-08-04 ENCOUNTER — Encounter (HOSPITAL_COMMUNITY): Payer: Self-pay | Admitting: Emergency Medicine

## 2020-08-04 DIAGNOSIS — J069 Acute upper respiratory infection, unspecified: Secondary | ICD-10-CM | POA: Diagnosis not present

## 2020-08-04 DIAGNOSIS — R1110 Vomiting, unspecified: Secondary | ICD-10-CM | POA: Diagnosis not present

## 2020-08-04 DIAGNOSIS — Z20822 Contact with and (suspected) exposure to covid-19: Secondary | ICD-10-CM | POA: Diagnosis not present

## 2020-08-04 DIAGNOSIS — I10 Essential (primary) hypertension: Secondary | ICD-10-CM | POA: Diagnosis not present

## 2020-08-04 DIAGNOSIS — R519 Headache, unspecified: Secondary | ICD-10-CM | POA: Diagnosis present

## 2020-08-04 DIAGNOSIS — F17290 Nicotine dependence, other tobacco product, uncomplicated: Secondary | ICD-10-CM | POA: Insufficient documentation

## 2020-08-04 DIAGNOSIS — Z79899 Other long term (current) drug therapy: Secondary | ICD-10-CM | POA: Diagnosis not present

## 2020-08-04 LAB — RESP PANEL BY RT-PCR (FLU A&B, COVID) ARPGX2
Influenza A by PCR: NEGATIVE
Influenza B by PCR: NEGATIVE
SARS Coronavirus 2 by RT PCR: NEGATIVE

## 2020-08-04 NOTE — Discharge Instructions (Addendum)
You have been seen here for URI like symptoms.  I recommend taking Tylenol for fever control and ibuprofen for pain control please follow dosing on the back of bottle.  I recommend staying hydrated and if you do not an appetite, I recommend soups as this will provide you with fluids and calories.  Your Covid test is negative  Follow-up with PCP if symptoms continue after 1 to 2 weeks.   Come back to the emergency department if you develop chest pain, shortness of breath, severe abdominal pain, uncontrolled nausea, vomiting, diarrhea.

## 2020-08-04 NOTE — ED Triage Notes (Signed)
Patient coming from home. Complaint of headache x4 days, sore throat since yesterday. A&Ox4. NAD.

## 2020-08-04 NOTE — ED Notes (Signed)
Pt d/c home per MD order. Discharge summary reviewed with pt, pt verbalizes understanding. Ambulatory off unit. No s/s of acute distress noted.  

## 2020-08-04 NOTE — ED Provider Notes (Signed)
[facility] EMERGENCY DEPARTMENT Provider Note   CSN: [id number] Arrival date & time: 08/04/20  4010     History Chief Complaint  Patient presents with  . Headache  . Sore Throat    Jennifer Forbes is a 50 y.o. female.  HPI   Patient with no significant medical history presents to the emergency department with chief complaint of URI-like symptoms.  Patient endorses headaches, nasal congestion, postnasal drip, productive cough, with fevers and chills, 2 episodes of vomiting.  She denies chest pain or shortness of breath, abdominal pain, nausea, diarrhea, urinary symptoms, worsening pedal edema.  She states she is vaccine gets COVID-19, denies recent sick contacts, is not immunocompromise at this time.  She describes her headache as a general throbbing sensation, not associated with change in vision, paresthesias or weakness in the upper or lower extremities, no recent head trauma, is not on anticoagulants patient denies any alleviating factors.  Past Medical History:  Diagnosis Date  . Arthritis    Left knee  . Hypertension     Patient Active Problem List   Diagnosis Date Noted  . Abnormal EKG 05/09/2017  . Tachycardia 05/09/2017  . Essential hypertension 10/24/2015  . Rheumatoid arthritis involving left knee (HCC) 10/24/2015  . Gastroesophageal reflux disease without esophagitis 10/24/2015  . Tobacco dependence 10/24/2015  . Morbid obesity (HCC) 10/24/2015    Past Surgical History:  Procedure Laterality Date  . BREAST BIOPSY  2015   left breast  . LIPOMA RESECTION  march 2014   right shoulder   . SKIN BIOPSY       OB History    Gravida  2   Para  1   Term  1   Preterm      AB  1   Living  1     SAB  1   IAB      Ectopic      Multiple      Live Births  1           Family History  Problem Relation Age of Onset  . Hypertension Mother   . Breast cancer Sister   . Diabetes Brother   . Breast cancer Maternal Aunt   .  Diabetes Maternal Uncle     Social History   Tobacco Use  . Smoking status: Current Every Day Smoker    Packs/day: 0.25    Types: Cigars  . Smokeless tobacco: Never Used  . Tobacco comment: black and mild   Vaping Use  . Vaping Use: Never used  Substance Use Topics  . Alcohol use: No  . Drug use: Not Currently    Home Medications Prior to Admission medications   Medication Sig Start Date End Date Taking? Authorizing Provider  amLODipine (NORVASC) 5 MG tablet TAKE 1 TABLET BY MOUTH EVERY DAY 11/06/19   Barbette Merino, NP  methocarbamol (ROBAXIN) 500 MG tablet Take 1 tablet (500 mg total) by mouth at bedtime as needed for muscle spasms. 10/03/19   Barbette Merino, NP  metoprolol succinate (TOPROL-XL) 25 MG 24 hr tablet TAKE 1/2 TABLET BY MOUTH EVERY DAY 11/06/19   Barbette Merino, NP  naproxen (NAPROSYN) 500 MG tablet TAKE 1 TABLET (500 MG TOTAL) BY MOUTH 2 (TWO) TIMES DAILY WITH A MEAL. Patient not taking: Reported on 11/08/2019 03/05/19   Quentin Angst, MD  pantoprazole (PROTONIX) 40 MG tablet Take 1 tablet (40 mg total) by mouth daily. 10/03/19   Barbette Merino,  NP    Allergies    Amoxicillin  Review of Systems   Review of Systems  Constitutional: Positive for chills and fever.  HENT: Positive for congestion, postnasal drip and sore throat.   Eyes: Negative for visual disturbance.  Respiratory: Positive for cough. Negative for shortness of breath.   Cardiovascular: Negative for chest pain.  Gastrointestinal: Positive for vomiting. Negative for abdominal pain, diarrhea and nausea.  Genitourinary: Negative for dysuria and enuresis.  Musculoskeletal: Negative for back pain and myalgias.  Skin: Negative for rash.  Neurological: Positive for headaches. Negative for dizziness.  Hematological: Does not bruise/bleed easily.    Physical Exam Updated Vital Signs BP 137/73   Pulse 85   Temp 98.9 F (37.2 C) (Oral)   Resp 16   LMP 01/20/2019   SpO2 100%   Physical  Exam Vitals and nursing note reviewed.  Constitutional:      General: She is not in acute distress.    Appearance: She is not ill-appearing.  HENT:     Head: Normocephalic and atraumatic.     Right Ear: Tympanic membrane, ear canal and external ear normal.     Left Ear: Tympanic membrane, ear canal and external ear normal.     Nose: Congestion present.     Comments: Erythematous turbinates.    Mouth/Throat:     Mouth: Mucous membranes are moist.     Pharynx: Oropharynx is clear. No oropharyngeal exudate or posterior oropharyngeal erythema.  Eyes:     Extraocular Movements: Extraocular movements intact.     Conjunctiva/sclera: Conjunctivae normal.     Pupils: Pupils are equal, round, and reactive to light.  Cardiovascular:     Rate and Rhythm: Normal rate and regular rhythm.     Pulses: Normal pulses.     Heart sounds: No murmur heard. No friction rub. No gallop.   Pulmonary:     Effort: No respiratory distress.     Breath sounds: No wheezing, rhonchi or rales.  Abdominal:     Palpations: Abdomen is soft.     Tenderness: There is no abdominal tenderness.  Musculoskeletal:     Right lower leg: No edema.     Left lower leg: No edema.     Comments: Patient is moving all 4 extremities at difficulty.  Skin:    General: Skin is warm and dry.  Neurological:     Mental Status: She is alert.     GCS: GCS eye subscore is 4. GCS verbal subscore is 5. GCS motor subscore is 6.     Cranial Nerves: No facial asymmetry.     Motor: No weakness.     Coordination: Romberg sign negative. Finger-Nose-Finger Test normal.     Comments: Cranial nerves III through XII are grossly intact.  Patient's have no difficulty with word finding.  Psychiatric:        Mood and Affect: Mood normal.     ED Results / Procedures / Treatments   Labs (all labs ordered are listed, but only abnormal results are displayed) Labs Reviewed  RESP PANEL BY RT-PCR (FLU A&B, COVID) [id number]    EKG EKG  Interpretation  Date/Time:  Monday August 04 2020 09:07:57 EST Ventricular Rate:  97 PR Interval:    QRS Duration: 81 QT Interval:  313 QTC Calculation: 398 R Axis:   24 Text Interpretation: Sinus rhythm Abnormal R-wave progression, early transition No significant change since last tracing Confirmed by Benjiman Core [phone]) on 08/04/2020 9:17:40 AM   Radiology DG Chest  Port 1 View  Result Date: 08/04/2020 CLINICAL DATA:  Headache, cough, sore throat and vomiting for 4 days. EXAM: PORTABLE CHEST 1 VIEW COMPARISON:  PA and lateral chest 11/07/2019. FINDINGS: The lungs are clear. Heart size is normal. No pneumothorax or pleural effusion. No acute or focal bony abnormality. IMPRESSION: No acute disease. Electronically Signed   By: Drusilla Kanner M.D.   On: 08/04/2020 10:45    Procedures Procedures   Medications Ordered in ED Medications - No data to display  ED Course  I have reviewed the triage vital signs and the nursing notes.  Pertinent labs & imaging results that were available during my care of the patient were reviewed by me and considered in my medical decision making (see chart for details).    MDM Rules/Calculators/A&P                         Initial impression-patient presents with URI-like symptoms.  She is alert, does not appear in acute distress, vital signs reassuring.  Suspect patient suffering from a viral URI.  Will obtain chest x-ray, Covid test for further evaluation.  Work-up-x-ray negative for acute findings, respiratory panel negative for Covid, influenza A/B.  Rule out-I have low suspicion for CVA or intracranial head bleed as patient denies recent trauma, is not on anticoagulant, there is no neuro deficits on my exam.  Low suspicion for meningitis as there is no meningeal sign noted on my exam.Low suspicion for systemic infection as patient is nontoxic-appearing, vital signs reassuring, no obvious source infection noted on exam.  Low suspicion for  pneumonia as lung sounds are clear bilaterally, x-ray did not reveal any acute findings.  I have low suspicion for PE as patient denies pleuritic chest pain, shortness of breath, patient is PERC. low suspicion for strep throat as oropharynx was visualized, no erythema or exudates noted.  Low suspicion patient would need  hospitalized due to viral infection or Covid as vital signs reassuring, patient is not in respiratory distress.    Plan-I suspect patient is having from a viral URI as this would explain her headaches, nasal congestion, cough.  Will provide her with Covid precautions, recommend over-the-counter pain medications, follow-up with PCP in 1 week's time if symptoms have not resolved.  Vital signs have remained stable, no indication for hospital admission. Patient given at home care as well strict return precautions.  Patient verbalized that they understood agreed to said plan.   Final Clinical Impression(s) / ED Diagnoses Final diagnoses:  Viral upper respiratory tract infection    Rx / DC Orders ED Discharge Orders    None       Carroll Sage, PA-C 08/04/20 1115    Benjiman Core, MD 08/04/20 1539

## 2020-09-19 ENCOUNTER — Other Ambulatory Visit: Payer: Self-pay

## 2020-09-19 ENCOUNTER — Ambulatory Visit (HOSPITAL_COMMUNITY)
Admission: EM | Admit: 2020-09-19 | Discharge: 2020-09-19 | Disposition: A | Discharge status: Home / Self Care | Payer: No Typology Code available for payment source | Attending: Physician Assistant | Admitting: Physician Assistant

## 2020-09-19 ENCOUNTER — Encounter (HOSPITAL_COMMUNITY): Payer: Self-pay

## 2020-09-19 DIAGNOSIS — Z20822 Contact with and (suspected) exposure to covid-19: Secondary | ICD-10-CM | POA: Insufficient documentation

## 2020-09-19 DIAGNOSIS — R059 Cough, unspecified: Secondary | ICD-10-CM

## 2020-09-19 DIAGNOSIS — F17210 Nicotine dependence, cigarettes, uncomplicated: Secondary | ICD-10-CM | POA: Insufficient documentation

## 2020-09-19 DIAGNOSIS — J029 Acute pharyngitis, unspecified: Secondary | ICD-10-CM

## 2020-09-19 DIAGNOSIS — J069 Acute upper respiratory infection, unspecified: Secondary | ICD-10-CM | POA: Insufficient documentation

## 2020-09-19 LAB — POC INFLUENZA A AND B ANTIGEN (URGENT CARE ONLY)
Influenza A Ag: NEGATIVE
Influenza B Ag: NEGATIVE

## 2020-09-19 MED ORDER — BENZONATATE 100 MG PO CAPS
100.0000 mg | ORAL_CAPSULE | Freq: Three times a day (TID) | ORAL | 0 refills | Status: DC | PRN
Start: 2020-09-19 — End: 2021-01-26

## 2020-09-19 NOTE — ED Provider Notes (Signed)
[facility]    CSN: [id number] Arrival date & time: 09/19/20  1401      History   Chief Complaint Chief Complaint  Patient presents with  . Cough  . Sore Throat    HPI NATURE Jennifer Forbes is a 50 y.o. female.   Pt complains of non productive cough, sore throat, and congestion that started yesterday. Denies fever, chills, n/v/d.  Reports some body aches.  She has taken nothing for the sx. No sick contacts.  Pt has had her COVID vaccine.      Past Medical History:  Diagnosis Date  . Arthritis    Left knee  . Hypertension     Patient Active Problem List   Diagnosis Date Noted  . Abnormal EKG 05/09/2017  . Tachycardia 05/09/2017  . Essential hypertension 10/24/2015  . Rheumatoid arthritis involving left knee (HCC) 10/24/2015  . Gastroesophageal reflux disease without esophagitis 10/24/2015  . Tobacco dependence 10/24/2015  . Morbid obesity (HCC) 10/24/2015    Past Surgical History:  Procedure Laterality Date  . BREAST BIOPSY  2015   left breast  . LIPOMA RESECTION  march 2014   right shoulder   . SKIN BIOPSY      OB History    Gravida  2   Para  1   Term  1   Preterm      AB  1   Living  1     SAB  1   IAB      Ectopic      Multiple      Live Births  1            Home Medications    Prior to Admission medications   Medication Sig Start Date End Date Taking? Authorizing Provider  amLODipine (NORVASC) 5 MG tablet TAKE 1 TABLET BY MOUTH EVERY DAY 11/06/19   Barbette Merino, NP  methocarbamol (ROBAXIN) 500 MG tablet Take 1 tablet (500 mg total) by mouth at bedtime as needed for muscle spasms. 10/03/19   Barbette Merino, NP  metoprolol succinate (TOPROL-XL) 25 MG 24 hr tablet TAKE 1/2 TABLET BY MOUTH EVERY DAY 11/06/19   Barbette Merino, NP  naproxen (NAPROSYN) 500 MG tablet TAKE 1 TABLET (500 MG TOTAL) BY MOUTH 2 (TWO) TIMES DAILY WITH A MEAL. Patient not taking: Reported on 11/08/2019 03/05/19   Quentin Angst, MD  pantoprazole  (PROTONIX) 40 MG tablet Take 1 tablet (40 mg total) by mouth daily. 10/03/19   Barbette Merino, NP    Family History Family History  Problem Relation Age of Onset  . Hypertension Mother   . Breast cancer Sister   . Diabetes Brother   . Breast cancer Maternal Aunt   . Diabetes Maternal Uncle     Social History Social History   Tobacco Use  . Smoking status: Current Every Day Smoker    Packs/day: 0.25    Types: Cigars  . Smokeless tobacco: Never Used  . Tobacco comment: black and mild   Vaping Use  . Vaping Use: Never used  Substance Use Topics  . Alcohol use: No  . Drug use: Not Currently     Allergies   Amoxicillin   Review of Systems Review of Systems  Constitutional: Negative for chills and fever.  HENT: Positive for congestion and sore throat. Negative for ear pain.   Eyes: Negative for pain and visual disturbance.  Respiratory: Positive for cough. Negative for shortness of breath and wheezing.  Cardiovascular: Negative for chest pain and palpitations.  Gastrointestinal: Negative for abdominal pain, diarrhea, nausea and vomiting.  Genitourinary: Negative for dysuria and hematuria.  Musculoskeletal: Negative for arthralgias and back pain.  Skin: Negative for color change and rash.  Neurological: Negative for seizures and syncope.  All other systems reviewed and are negative.    Physical Exam Triage Vital Signs ED Triage Vitals  Enc Vitals Group     BP 09/19/20 1522 (!) 132/92     Pulse Rate 09/19/20 1522 82     Resp 09/19/20 1522 17     Temp 09/19/20 1522 98.6 F (37 C)     Temp Source 09/19/20 1522 Oral     SpO2 09/19/20 1522 100 %     Weight --      Height --      Head Circumference --      Peak Flow --      Pain Score 09/19/20 1520 3     Pain Loc --      Pain Edu? --      Excl. in GC? --    No data found.  Updated Vital Signs BP (!) 132/92 (BP Location: Left Arm)   Pulse 82   Temp 98.6 F (37 C) (Oral)   Resp 17   LMP 01/20/2019    SpO2 100%   Visual Acuity Right Eye Distance:   Left Eye Distance:   Bilateral Distance:    Right Eye Near:   Left Eye Near:    Bilateral Near:     Physical Exam Vitals and nursing note reviewed.  Constitutional:      General: She is not in acute distress.    Appearance: She is well-developed.  HENT:     Head: Normocephalic and atraumatic.  Eyes:     Conjunctiva/sclera: Conjunctivae normal.  Cardiovascular:     Rate and Rhythm: Normal rate and regular rhythm.     Heart sounds: No murmur heard.   Pulmonary:     Effort: Pulmonary effort is normal. No respiratory distress.     Breath sounds: Normal breath sounds.  Abdominal:     Palpations: Abdomen is soft.     Tenderness: There is no abdominal tenderness.  Musculoskeletal:     Cervical back: Neck supple.  Skin:    General: Skin is warm and dry.  Neurological:     Mental Status: She is alert.      UC Treatments / Results  Labs (all labs ordered are listed, but only abnormal results are displayed) Labs Reviewed - No data to display  EKG   Radiology No results found.  Procedures Procedures (including critical care time)  Medications Ordered in UC Medications - No data to display  Initial Impression / Assessment and Plan / UC Course  I have reviewed the triage vital signs and the nursing notes.  Pertinent labs & imaging results that were available during my care of the patient were reviewed by me and considered in my medical decision making (see chart for details).     URI. Flu negative. COVID test pending.  Supportive care discussed. Tessalon pearls prescribed.  Return precautions discussed.  Final Clinical Impressions(s) / UC Diagnoses   Final diagnoses:  None   Discharge Instructions   None    ED Prescriptions    None     PDMP not reviewed this encounter.   Jodell Cipro, PA-C 09/19/20 1622

## 2020-09-19 NOTE — Discharge Instructions (Addendum)
Recommend allergy medication like Zyrtec or Allegra Recommend Flonase Can take ibuprofen as needed for body aches/fever/headaches COVID test pending.  If positive self isolate for 5 days, then wear a mask around others for 5 days.

## 2020-09-19 NOTE — ED Triage Notes (Signed)
Pt presents with non productive cough, sore throat, and nasal drainage since yesterday.

## 2020-09-20 LAB — SARS CORONAVIRUS 2 (TAT 6-24 HRS): SARS Coronavirus 2: NEGATIVE

## 2020-11-13 ENCOUNTER — Telehealth: Payer: Self-pay

## 2020-11-13 ENCOUNTER — Other Ambulatory Visit: Payer: Self-pay | Admitting: Nurse Practitioner

## 2020-11-13 DIAGNOSIS — I10 Essential (primary) hypertension: Secondary | ICD-10-CM

## 2020-11-13 DIAGNOSIS — R000 Tachycardia, unspecified: Secondary | ICD-10-CM

## 2020-11-13 MED ORDER — METOPROLOL SUCCINATE ER 25 MG PO TB24: 0.5000 | ORAL_TABLET | Freq: Every day | ORAL | 3 refills | Status: DC

## 2020-11-13 MED ORDER — AMLODIPINE BESYLATE 5 MG PO TABS: 1.0000 | ORAL_TABLET | Freq: Every day | ORAL | 3 refills | Status: DC

## 2020-11-13 NOTE — Telephone Encounter (Signed)
  Med refills  Ambien Metoprolol

## 2020-11-19 ENCOUNTER — Other Ambulatory Visit: Payer: Self-pay | Admitting: Nurse Practitioner

## 2020-11-19 DIAGNOSIS — M5442 Lumbago with sciatica, left side: Secondary | ICD-10-CM

## 2020-11-19 NOTE — Progress Notes (Unsigned)
    [facility] [address] [place], [place]  [address] Phone:  [phone]   Fax:  [phone] 

## 2020-11-20 NOTE — Telephone Encounter (Signed)
Per Crystal patient has not been seen in office since 11/2019. Must have physical capacity evaluation. Referral placed for Physical Therapy. Called patient and advised. Verbally understood no additional questions .

## 2020-12-03 ENCOUNTER — Ambulatory Visit: Payer: Self-pay | Attending: Nurse Practitioner

## 2020-12-03 ENCOUNTER — Other Ambulatory Visit: Payer: Self-pay

## 2020-12-03 DIAGNOSIS — M5441 Lumbago with sciatica, right side: Secondary | ICD-10-CM | POA: Insufficient documentation

## 2020-12-03 DIAGNOSIS — G8929 Other chronic pain: Secondary | ICD-10-CM | POA: Insufficient documentation

## 2020-12-03 DIAGNOSIS — M5442 Lumbago with sciatica, left side: Secondary | ICD-10-CM | POA: Insufficient documentation

## 2020-12-03 NOTE — Therapy (Addendum)
[place], [place], [address] Phone: [phone]   Fax:  [phone]  Physical Therapy Evaluation/ FCE  Patient Details  Name: Jennifer Forbes MRN: [id number] Date of Birth: 10-27-1970 Referring Provider (PT): Dionisio David  NP   Encounter Date: 12/03/2020   PT End of Session - 12/03/20 1200     Visit Number 1    Number of Visits 1    Authorization Type self pay    PT Start Time 0800    PT Stop Time 5573    PT Time Calculation (min) 225 min    Activity Tolerance Patient limited by pain    Behavior During Therapy [facility] for tasks assessed/performed             Past Medical History:  Diagnosis Date   Arthritis    Left knee   Hypertension     Past Surgical History:  Procedure Laterality Date   BREAST BIOPSY  2015   left breast   LIPOMA RESECTION  march 2014   right shoulder    SKIN BIOPSY      There were no vitals filed for this visit.    Subjective Assessment - 12/03/20 0809     Subjective She reports she is here to get an employment formed filled out.   She has offer for employment at [employer] and needs pre employment gudelines of capability.                [facility] PT Assessment - 12/03/20 0001       Assessment   Medical Diagnosis Chronic LBP    Referring Provider (PT) Dionisio David  NP    Hand Dominance Right (P)     Next MD Visit 12/20/2020 (P)     Prior Therapy Physical therapy and chiropractor (P)       Precautions   Precautions None      Restrictions   Weight Bearing Restrictions No      Prior Function   Level of Independence Independent      Cognition   Overall Cognitive Status Within Functional Limits for tasks assessed                        Objective measurements completed on examination: See above findings.               PT Education - 12/03/20 1159     Education Details Possible increased sorness for 2-4 days post.   Basic  review of results of FCE    Person(s) Educated Patient    Methods Explanation    Comprehension Verbalized understanding                         Plan - 12/03/20 1202     Clinical Impression Statement Ms Starkman completed the FCE with increased pain but improved performance at end of of FCE. She was rated at a light duty level for an 8 hour day. See the FCE for specifics. Her pain levels were high by the end.  The FCE rating was an indication of the minimum level and not maximum level due to inconsistency and self limiting behavior. Please see the FCE report. I will fax the report and the USPS forms to Ms.  Haskell Flirt for completion of the forms and any restrictions she may feel appropriate    PT Next Visit Plan No follow up.    Recommended  Other Services PT for conditioning activity to improve activity tolerance    Consulted and Agree with Plan of Care Patient             Patient will benefit from skilled therapeutic intervention in order to improve the following deficits and impairments:     Visit Diagnosis: Chronic left-sided low back pain with bilateral sciatica     Problem List Patient Active Problem List   Diagnosis Date Noted   Abnormal EKG 05/09/2017   Tachycardia 05/09/2017   Essential hypertension 10/24/2015   Rheumatoid arthritis involving left knee ([place]) 10/24/2015   Gastroesophageal reflux disease without esophagitis 10/24/2015   Tobacco dependence 10/24/2015   Morbid obesity ([place]) 10/24/2015    Darrel Hoover  PT 12/03/2020, 12:08 PM  [facility] [address] [place], [place], [address] Phone: [phone]   Fax:  [phone]  Name: SARAIH LORTON MRN: [id number] Date of Birth: 1971-02-28  PHYSICAL THERAPY DISCHARGE SUMMARY  Visits from Start of Care: 1  Current functional level related to goals / functional outcomes: See FCE report   Remaining deficits: See FCE report   Education /  Equipment: Report reviewed with pt/  Patient agrees to discharge. Patient goals were met. Patient is being discharged due to meeting the stated rehab goals.

## 2021-01-26 ENCOUNTER — Ambulatory Visit (INDEPENDENT_AMBULATORY_CARE_PROVIDER_SITE_OTHER): Payer: Self-pay | Admitting: Nurse Practitioner

## 2021-01-26 ENCOUNTER — Other Ambulatory Visit: Payer: Self-pay

## 2021-01-26 ENCOUNTER — Encounter: Payer: Self-pay | Admitting: Nurse Practitioner

## 2021-01-26 VITALS — BP 135/73 | HR 85 | Temp 97.9°F | Ht 68.0 in | Wt 237.1 lb

## 2021-01-26 DIAGNOSIS — F17209 Nicotine dependence, unspecified, with unspecified nicotine-induced disorders: Secondary | ICD-10-CM

## 2021-01-26 DIAGNOSIS — I10 Essential (primary) hypertension: Secondary | ICD-10-CM

## 2021-01-26 DIAGNOSIS — G8929 Other chronic pain: Secondary | ICD-10-CM

## 2021-01-26 DIAGNOSIS — R000 Tachycardia, unspecified: Secondary | ICD-10-CM

## 2021-01-26 DIAGNOSIS — M545 Low back pain, unspecified: Secondary | ICD-10-CM

## 2021-01-26 DIAGNOSIS — R42 Dizziness and giddiness: Secondary | ICD-10-CM

## 2021-01-26 DIAGNOSIS — Z13220 Encounter for screening for lipoid disorders: Secondary | ICD-10-CM

## 2021-01-26 LAB — POCT URINALYSIS DIPSTICK
Blood, UA: NEGATIVE
Glucose, UA: NEGATIVE
Leukocytes, UA: NEGATIVE
Nitrite, UA: NEGATIVE
Protein, UA: NEGATIVE
Spec Grav, UA: >=1.03 — AB (ref 1.010–1.025)
Urobilinogen, UA: 0.2 E.U./dL
pH, UA: 5.5 (ref 5.0–8.0)

## 2021-01-26 MED ORDER — PANTOPRAZOLE SODIUM 40 MG PO TBEC: 40.0000 mg | DELAYED_RELEASE_TABLET | Freq: Every day | ORAL | 3 refills | Status: DC

## 2021-01-26 MED ORDER — METHOCARBAMOL 500 MG PO TABS: 500.0000 mg | ORAL_TABLET | Freq: Every evening | ORAL | 5 refills | Status: DC | PRN

## 2021-01-26 MED ORDER — NAPROXEN 500 MG PO TABS: 500.0000 mg | ORAL_TABLET | Freq: Two times a day (BID) | ORAL | 5 refills | Status: DC

## 2021-01-26 MED ORDER — DOXYCYCLINE HYCLATE 100 MG PO CAPS: 100.0000 mg | ORAL_CAPSULE | Freq: Two times a day (BID) | ORAL | 0 refills | Status: AC

## 2021-01-26 NOTE — Progress Notes (Signed)
[facility] Patient [facility] [address] [place], [place]  [address] Phone:  [phone]   Fax:  [phone]   Established Patient Office Visit  Subjective:  Patient ID: Jennifer Forbes, female    DOB: 11/20/1970  Age: 50 y.o. MRN: [id number]  CC:  Chief Complaint  Patient presents with   Follow-up    Vertigo Dizziness, if leans or lays back a certain way. Feels off balance, 2 months.    HPI Jennifer Forbes presents for follow up. She has been lost to follow up last apt >1 year. She  has a past medical history of Arthritis and Hypertension.   Vertigo - Dizziness Patient presents with dizziness .  The dizziness has been present for 2 months. The patient describes the symptoms as disequalibirum and vertigo. Symptoms are exacerbated by looking  upward, bending, and stress The patient also complains of  right ear drainage . Patient denies none.  She has been treated with nothing   She reports that her grandchildren were diagnosed with COVID her grandson has long haulers syndrome. 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  Past Medical History:  Diagnosis Date   Arthritis    Left knee   Hypertension     Past Surgical History:  Procedure Laterality Date   BREAST BIOPSY  2015   left breast   LIPOMA RESECTION  march 2014   right shoulder    SKIN BIOPSY      Family History  Problem Relation Age of Onset   Hypertension Mother    Breast cancer Sister    Diabetes Brother    Breast cancer Maternal Aunt    Diabetes Maternal Uncle     Social History   Socioeconomic History   Marital status: Legally Separated    Spouse name: Not on file   Number of children: 1   Years of education: Not on file   Highest education level: Associate degree: occupational, Scientist, product/process development, or vocational program  Occupational History   Not on file  Tobacco Use   Smoking status: Every Day    Packs/day: 0.25    Types: Cigars, Cigarettes    Smokeless tobacco: Never   Tobacco comments:    black and mild   Vaping Use   Vaping Use: Never used  Substance and Sexual Activity   Alcohol use: No   Drug use: Not Currently   Sexual activity: Yes    Birth control/protection: None  Other Topics Concern   Not on file  Social History Narrative   Not on file   Social Determinants of Health   Financial Resource Strain: Not on file  Food Insecurity: Not on file  Transportation Needs: Not on file  Physical Activity: Not on file  Stress: Not on file  Social Connections: Not on file  Intimate Partner Violence: Not on file    Outpatient Medications Prior to Visit  Medication Sig Dispense Refill   amLODipine (NORVASC) 5 MG tablet Take 1 tablet (5 mg total) by mouth daily. 90 tablet 3   metoprolol succinate (TOPROL-XL) 25 MG 24 hr tablet Take 0.5 tablets (12.5 mg total) by mouth daily. 45 tablet 3   methocarbamol (ROBAXIN) 500 MG tablet Take 1 tablet (500 mg total) by mouth at bedtime as needed for muscle spasms. 90 tablet 0   naproxen (NAPROSYN) 500 MG tablet TAKE 1 TABLET (500 MG TOTAL) BY MOUTH 2 (TWO) TIMES DAILY WITH A MEAL. 30  tablet 0   pantoprazole (PROTONIX) 40 MG tablet Take 1 tablet (40 mg total) by mouth daily. 30 tablet 3   benzonatate (TESSALON) 100 MG capsule Take 1 capsule (100 mg total) by mouth 3 (three) times daily as needed for cough. 21 capsule 0   No facility-administered medications prior to visit.    Allergies  Allergen Reactions   Amoxicillin Rash    ROS Review of Systems  Eyes:        She has left eye twitching  Gastrointestinal:  Positive for vomiting (unable to egg and has done this on occasion not related to dizziness).     Objective:    Physical Exam Constitutional:      Appearance: She is obese.  HENT:     Head: Normocephalic and atraumatic.     Nose: Nose normal.     Mouth/Throat:     Mouth: Mucous membranes are moist.  Cardiovascular:     Rate and Rhythm: Normal rate and regular  rhythm.     Pulses: Normal pulses.     Heart sounds: Normal heart sounds.  Pulmonary:     Effort: Pulmonary effort is normal.     Breath sounds: Normal breath sounds.  Musculoskeletal:        General: Normal range of motion.     Cervical back: Normal range of motion.  Skin:    General: Skin is warm.     Capillary Refill: Capillary refill takes less than 2 seconds.  Neurological:     General: No focal deficit present.     Mental Status: She is alert and oriented to person, place, and time.  Psychiatric:        Mood and Affect: Mood normal.        Behavior: Behavior normal.    BP 135/73 (BP Location: Right Arm, Patient Position: Sitting)   Pulse 85   Temp 97.9 F (36.6 C)   Ht 5\' 8"  (1.727 m)   Wt 237 lb 0.8 oz (107.5 kg)   LMP 01/20/2019   SpO2 100%   BMI 36.04 kg/m  Wt Readings from Last 3 Encounters:  01/26/21 237 lb 0.8 oz (107.5 kg)  10/03/19 257 lb 12.8 oz (116.9 kg)  02/14/19 283 lb (128.4 kg)     Health Maintenance Due  Topic Date Due   COVID-19 Vaccine (1) Never done   HIV Screening  Never done   Hepatitis C Screening  Never done   PAP SMEAR-Modifier  Never done    There are no preventive care reminders to display for this patient.  Lab Results  Component Value Date   TSH 1.660 10/03/2019   Lab Results  Component Value Date   WBC 9.2 11/08/2019   HGB 14.6 11/08/2019   HCT 43.0 11/08/2019   MCV 93.3 11/08/2019   PLT 455 (H) 11/08/2019   Lab Results  Component Value Date   NA 137 11/08/2019   K 3.7 11/08/2019   CO2 21 (L) 11/08/2019   GLUCOSE 86 11/08/2019   BUN 7 11/08/2019   CREATININE 0.66 11/08/2019   BILITOT 0.7 11/08/2019   ALKPHOS 92 11/08/2019   AST 19 11/08/2019   ALT 9 11/08/2019   PROT 7.3 11/08/2019   ALBUMIN 2.8 (L) 11/08/2019   CALCIUM 8.8 (L) 11/08/2019   ANIONGAP 14 11/08/2019   Lab Results  Component Value Date   CHOL 160 10/03/2019   Lab Results  Component Value Date   HDL 56 10/03/2019   Lab Results   Component  Value Date   LDLCALC 89 10/03/2019   Lab Results  Component Value Date   TRIG 77 10/03/2019   Lab Results  Component Value Date   CHOLHDL 2.9 10/03/2019   Lab Results  Component Value Date   HGBA1C 5.3 05/06/2017      Assessment & Plan:   Problem List Items Addressed This Visit       Cardiovascular and Mediastinum   Essential hypertension - Primary Encouraged on going compliance with current medication regimen Encouraged home monitoring and recording BP <130/80 Eating a heart-healthy diet with less salt Encouraged regular physical activity  Recommend Weight loss     Relevant Orders   Urinalysis Dipstick (Completed)   Comp. Metabolic Panel (12) (Completed)     Other   Tachycardia Discussed    Other Visit Diagnoses     Chronic low back pain without sciatica, unspecified back pain laterality   Persistent but stable      Relevant Medications   methocarbamol (ROBAXIN) 500 MG tablet   naproxen (NAPROSYN) 500 MG tablet   Tobacco use disorder, continuous       Dizziness     Ongoing    Screening for cholesterol level       Relevant Orders   Lipid panel (Completed)  Screening  Referral to [facility] for annual screening of breast and cervix      Meds ordered this encounter  Medications   methocarbamol (ROBAXIN) 500 MG tablet    Sig: Take 1 tablet (500 mg total) by mouth at bedtime as needed for muscle spasms.    Dispense:  90 tablet    Refill:  5    Do not place this medication, or any other prescription from our practice, on "Automatic Refill". Patient may have prescription filled one day early if pharmacy is closed on scheduled refill date.   naproxen (NAPROSYN) 500 MG tablet    Sig: Take 1 tablet (500 mg total) by mouth 2 (two) times daily with a meal.    Dispense:  30 tablet    Refill:  5   pantoprazole (PROTONIX) 40 MG tablet    Sig: Take 1 tablet (40 mg total) by mouth daily.    Dispense:  90 tablet    Refill:  3   doxycycline (VIBRAMYCIN)  100 MG capsule    Sig: Take 1 capsule (100 mg total) by mouth 2 (two) times daily for 10 days.    Dispense:  20 capsule    Refill:  0    Order Specific Question:   Supervising Provider    Answer:   Quentin Angst [[id number]]    Follow-up: No follow-ups on file.    Barbette Merino, NP

## 2021-01-26 NOTE — Patient Instructions (Signed)
Dizziness Dizziness is a common problem. It makes you feel unsteady or light-headed. You may feel like you are about to pass out (faint). Dizziness can lead to getting hurt if you stumble or fall. Dizziness can be caused by many things, including: Medicines. Not having enough water in your body (dehydration). Illness. Follow these instructions at home: Eating and drinking  Drink enough fluid to keep your pee (urine) pale yellow. This helps to keep you from getting dehydrated. Try to drink more clear fluids, such as water. Do not drink alcohol. Limit how much caffeine you drink or eat, if your doctor tells you to do that. Limit how much salt (sodium) you drink or eat, if your doctor tells you to do that.  Activity  Avoid making quick movements. Stand up slowly from sitting in a chair, and steady yourself until you feel okay. In the morning, first sit up on the side of the bed. When you feel okay, stand up slowly while you hold onto something. Do this until you know that your balance is okay. If you need to stand in one place for a long time, move your legs often. Tighten and relax the muscles in your legs while you are standing. Do not drive or use machinery if you feel dizzy. Avoid bending down if you feel dizzy. Place items in your home so you can reach them easily without leaning over.  Lifestyle Do not smoke or use any products that contain nicotine or tobacco. If you need help quitting, ask your doctor. Try to lower your stress level. You can do this by using methods such as yoga or meditation. Talk with your doctor if you need help. General instructions Watch your dizziness for any changes. Take over-the-counter and prescription medicines only as told by your doctor. Talk with your doctor if you think that you are dizzy because of a medicine that you are taking. Tell a friend or a family member that you are feeling dizzy. If he or she notices any changes in your behavior, have this  person call your doctor. Keep all follow-up visits. Contact a doctor if: Your dizziness does not go away. Your dizziness or light-headedness gets worse. You feel like you may vomit (are nauseous). You have trouble hearing. You have new symptoms. You are unsteady on your feet. You feel like the room is spinning. You have neck pain or a stiff neck. You have a fever. Get help right away if: You vomit or have watery poop (diarrhea), and you cannot eat or drink anything. You have trouble: Talking. Walking. Swallowing. Using your arms, hands, or legs. You feel generally weak. You are not thinking clearly, or you have trouble forming sentences. A friend or family member may notice this. You have: Chest pain. Pain in your belly (abdomen). Shortness of breath. Sweating. Your vision changes. You are bleeding. You have a very bad headache. These symptoms may be an emergency. Get help right away. Call your local emergency services (911 in the [place] Do not wait to see if the symptoms will go away. Do not drive yourself to the hospital. Summary Dizziness makes you feel unsteady or light-headed. You may feel like you are about to pass out (faint). Drink enough fluid to keep your pee (urine) pale yellow. Do not drink alcohol. Avoid making quick movements if you feel dizzy. Watch your dizziness for any changes. This information is not intended to replace advice given to you by your health care provider. Make sure you discuss   any questions you have with your healthcare provider. Document Revised: 04/28/2020 Document Reviewed: 04/28/2020 Elsevier Patient Education  2022 [employer]  Benign Positional Vertigo Vertigo is the feeling that you or your surroundings are moving when they are not. Benign positional vertigo is the most common form of vertigo. This is usually a harmless condition (benign). This condition is positional. This means that symptoms are triggered bycertain movements and  positions. This condition can be dangerous if it occurs while you are doing something that could cause harm to yourself or others. This includes activities such asdriving or operating machinery. What are the causes? The inner ear has fluid-filled canals that help your brain sense movement and balance. When the fluid moves, the brain receives messages about your body'sposition. With benign positional vertigo, calcium crystals in the inner ear break free and disturb the inner ear area. This causes your brain to receive confusingmessages about your body's position. What increases the risk? You are more likely to develop this condition if: You are a woman. You are 49 years of age or older. You have recently had a head injury. You have an inner ear disease. What are the signs or symptoms? Symptoms of this condition usually happen when you move your head or your eyes in different directions. Symptoms may start suddenly and usually last for less than a minute. They include: Loss of balance and falling. Feeling like you are spinning or moving. Feeling like your surroundings are spinning or moving. Nausea and vomiting. Blurred vision. Dizziness. Involuntary eye movement (nystagmus). Symptoms can be mild and cause only minor problems, or they can be severe and interfere with daily life. Episodes of benign positional vertigo may return (recur) over time. Symptoms may also improve over time. How is this diagnosed? This condition may be diagnosed based on: Your medical history. A physical exam of the head, neck, and ears. Positional tests to check for or stimulate vertigo. You may be asked to turn your head and change positions, such as going from sitting to lying down. A health care provider will watch for symptoms of vertigo. You may be referred to a health care provider who specializes in ear, nose, and throat problems (ENT or otolaryngologist) or a provider who specializes in disorders of the  nervous system (neurologist). How is this treated?  This condition may be treated in a session in which your health care provider moves your head in specific positions to help the displaced crystals in your inner ear move. Treatment for this condition may take several sessions. Surgerymay be needed in severe cases, but this is rare. In some cases, benign positional vertigo may resolve on its own in 2-4 weeks. Follow these instructions at home: Safety Move slowly. Avoid sudden body or head movements or certain positions, as told by your health care provider. Avoid driving or operating machinery until your health care provider says it is safe. Avoid doing any tasks that would be dangerous to you or others if vertigo occurs. If you have trouble walking or keeping your balance, try using a cane for stability. If you feel dizzy or unstable, sit down right away. Return to your normal activities as told by your health care provider. Ask your health care provider what activities are safe for you. General instructions Take over-the-counter and prescription medicines only as told by your health care provider. Drink enough fluid to keep your urine pale yellow. Keep all follow-up visits. This is important. Contact a health care provider if: You have a  fever. Your condition gets worse or you develop new symptoms. Your family or friends notice any behavioral changes. You have nausea or vomiting that gets worse. You have numbness or a prickling and tingling sensation. Get help right away if you: Have difficulty speaking or moving. Are always dizzy or faint. Develop severe headaches. Have weakness in your legs or arms. Have changes in your hearing or vision. Develop a stiff neck. Develop sensitivity to light. These symptoms may represent a serious problem that is an emergency. Do not wait to see if the symptoms will go away. Get medical help right away. Call your local emergency services (911 in the  [place] Do not drive yourself to the hospital. Summary Vertigo is the feeling that you or your surroundings are moving when they are not. Benign positional vertigo is the most common form of vertigo. This condition is caused by calcium crystals in the inner ear that become displaced. This causes a disturbance in an area of the inner ear that helps your brain sense movement and balance. Symptoms include loss of balance and falling, feeling that you or your surroundings are moving, nausea and vomiting, and blurred vision. This condition can be diagnosed based on symptoms, a physical exam, and positional tests. Follow safety instructions as told by your health care provider and keep all follow-up visits. This is important. This information is not intended to replace advice given to you by your health care provider. Make sure you discuss any questions you have with your healthcare provider. Document Revised: 04/23/2020 Document Reviewed: 04/23/2020 Elsevier Patient Education  2022 [employer].

## 2021-01-27 LAB — COMP. METABOLIC PANEL (12)
AST: 27 IU/L (ref 0–40)
Albumin/Globulin Ratio: 1.9 (ref 1.2–2.2)
Albumin: 4.7 g/dL (ref 3.8–4.8)
Alkaline Phosphatase: 87 IU/L (ref 44–121)
BUN/Creatinine Ratio: 18 (ref 9–23)
BUN: 12 mg/dL (ref 6–24)
Bilirubin Total: <0.2 mg/dL (ref 0.0–1.2)
Calcium: 9.4 mg/dL (ref 8.7–10.2)
Chloride: 104 mmol/L (ref 96–106)
Creatinine, Ser: 0.68 mg/dL (ref 0.57–1.00)
Globulin, Total: 2.5 g/dL (ref 1.5–4.5)
Glucose: 66 mg/dL (ref 65–99)
Potassium: 4.1 mmol/L (ref 3.5–5.2)
Sodium: 140 mmol/L (ref 134–144)
Total Protein: 7.2 g/dL (ref 6.0–8.5)
eGFR: 107 mL/min/{1.73_m2} (ref >59)

## 2021-01-27 LAB — LIPID PANEL
Chol/HDL Ratio: 4 ratio (ref 0.0–4.4)
Cholesterol, Total: 191 mg/dL (ref 100–199)
HDL: 48 mg/dL (ref >39)
LDL Chol Calc (NIH): 120 mg/dL — ABNORMAL HIGH (ref 0–99)
Triglycerides: 126 mg/dL (ref 0–149)
VLDL Cholesterol Cal: 23 mg/dL (ref 5–40)

## 2021-01-28 ENCOUNTER — Other Ambulatory Visit: Payer: Self-pay

## 2021-01-29 ENCOUNTER — Other Ambulatory Visit: Payer: Self-pay | Admitting: Nurse Practitioner

## 2021-01-29 DIAGNOSIS — I10 Essential (primary) hypertension: Secondary | ICD-10-CM

## 2021-02-16 ENCOUNTER — Other Ambulatory Visit: Payer: Self-pay | Admitting: Nurse Practitioner

## 2021-02-16 DIAGNOSIS — R000 Tachycardia, unspecified: Secondary | ICD-10-CM

## 2021-04-04 ENCOUNTER — Other Ambulatory Visit: Payer: Self-pay | Admitting: Nurse Practitioner

## 2021-04-04 DIAGNOSIS — I10 Essential (primary) hypertension: Secondary | ICD-10-CM

## 2021-04-29 ENCOUNTER — Ambulatory Visit: Payer: No Typology Code available for payment source | Admitting: Nurse Practitioner

## 2021-06-09 ENCOUNTER — Other Ambulatory Visit: Payer: Self-pay | Admitting: Nurse Practitioner

## 2021-06-09 DIAGNOSIS — I10 Essential (primary) hypertension: Secondary | ICD-10-CM

## 2021-06-12 ENCOUNTER — Other Ambulatory Visit: Payer: Self-pay | Admitting: Obstetrics and Gynecology

## 2021-06-12 DIAGNOSIS — Z1231 Encounter for screening mammogram for malignant neoplasm of breast: Secondary | ICD-10-CM

## 2021-07-14 ENCOUNTER — Other Ambulatory Visit: Payer: Self-pay

## 2021-07-14 ENCOUNTER — Ambulatory Visit
Admission: RE | Admit: 2021-07-14 | Discharge: 2021-07-14 | Disposition: A | Discharge status: Home / Self Care | Payer: No Typology Code available for payment source | Source: Ambulatory Visit | Attending: Obstetrics and Gynecology | Admitting: Obstetrics and Gynecology

## 2021-07-14 ENCOUNTER — Ambulatory Visit: Payer: Self-pay | Admitting: *Deleted

## 2021-07-14 VITALS — BP 140/80 | Wt 219.0 lb

## 2021-07-14 DIAGNOSIS — Z1231 Encounter for screening mammogram for malignant neoplasm of breast: Secondary | ICD-10-CM

## 2021-07-14 DIAGNOSIS — Z1239 Encounter for other screening for malignant neoplasm of breast: Secondary | ICD-10-CM

## 2021-07-14 DIAGNOSIS — Z1211 Encounter for screening for malignant neoplasm of colon: Secondary | ICD-10-CM

## 2021-07-14 NOTE — Patient Instructions (Addendum)
Explained breast self awareness with [name]. Patient refused Pap smear today due to stated she needed to get to another appointment. Patient scheduled to come to the free cervical cancer screening clinic on Tuesday, September 29, 2021 at 0830. Let her know [facility] will cover Pap smears every 3 years unless has a history of abnormal Pap smears. Referred patient to the [facility] for a screening mammogram on mobile unit. Appointment scheduled Tuesday, July 14, 2021 at 1040. Patient aware of appointment and will be there. Let patient know the Breast Center will follow up with her within the next couple weeks with results of her mammogram by letter or phone. [name] verbalized understanding.  [name], Kathaleen Maser, RN 10:14 AM   .

## 2021-07-14 NOTE — Progress Notes (Signed)
Ms. Jennifer Forbes is a 51 y.o. female who presents to [facility] clinic today with no complaints.    Pap Smear: Pap smear not completed today. Last Pap smear was in February 2018 at the free cervical cancer screening at the [facility] and normal per patient. Per patient has a history of an abnormal Pap smear in 1993 that a colposcopy was completed for follow-up. Patient states all Pap smears have been normal since colposcopy and has had more than three normal Pap smears. No Pap smear results are in Epic.  Physical exam: Breasts Breasts symmetrical. No skin abnormalities bilateral breasts. No nipple retraction bilateral breasts. No nipple discharge bilateral breasts. No lymphadenopathy. No lumps palpated bilateral breasts. No complaints of pain or tenderness on exam.  MS DIGITAL DIAG TOMO BILAT  Result Date: 10/10/2018 CLINICAL DATA:  51 year old female with focal pain and thickening in the UPPER-OUTER LEFT breast and LEFT axilla. Also for annual bilateral mammogram. EXAM: DIGITAL DIAGNOSTIC BILATERAL MAMMOGRAM WITH CAD AND TOMO ULTRASOUND LEFT AXILLA COMPARISON:  Previous exam(s). ACR Breast Density Category a: The breast tissue is almost entirely fatty. FINDINGS: 2D/3D full field views of both breasts demonstrate no suspicious mass, distortion or worrisome calcifications. Mammographic images were processed with CAD. Targeted ultrasound is performed, showing no solid or cystic mass, distortion, abnormal shadowing or abnormal lymph nodes within the LEFT axilla. IMPRESSION: 1. No mammographic or sonographic abnormality within the LEFT axilla. 2. No mammographic evidence of breast malignancy. RECOMMENDATION: Bilateral screening mammogram in 1 year I have discussed the findings and recommendations with the patient. Results were also provided in writing at the conclusion of the visit. If applicable, a reminder letter will be sent to the patient regarding the next appointment. BI-RADS CATEGORY  1:  Negative. Electronically Signed   By: Harmon Pier M.D.   On: 10/10/2018 15:35         Pelvic/Bimanual Patient refused Pap smear today due to stated she needed to get to another appointment. Patient scheduled to come to the free cervical cancer screening clinic on Tuesday, September 29, 2021 at 0830.   Smoking History: Patient is a current smoker. Discussed smoking cessation with patient. Referred to the [facility] Quitline.   Patient Navigation: Patient education provided. Access to services provided for patient through [facility] program.   Colorectal Cancer Screening: Per patient has never had colonoscopy completed. FIT Test given to patient to complete. No complaints today.    Breast and Cervical Cancer Risk Assessment: Patient has a family history of her sister and a maternal aunt having breast cancer. patient has no known genetic mutations or history of radiation treatment to the chest before age 89. Per patient has a history of cervical dysplasia. Patient has no history of being immunocompromised or DES exposure in-utero.  Risk Assessment     Risk Scores       07/14/2021 10/10/2018   Last edited by: Narda Rutherford, LPN Stoney Bang H, LPN   5-year risk: 1.8 % 1.6 %   Lifetime risk: 13.7 % 14.3 %            A: [facility]CP exam without pap smear No complaints.  P: Referred patient to the [facility] for a screening mammogram on mobile unit. Appointment scheduled Tuesday, July 14, 2021 at 1040.  Priscille Heidelberg, RN 07/14/2021 10:14 AM

## 2021-07-16 ENCOUNTER — Other Ambulatory Visit: Payer: Self-pay | Admitting: Obstetrics and Gynecology

## 2021-07-16 DIAGNOSIS — R928 Other abnormal and inconclusive findings on diagnostic imaging of breast: Secondary | ICD-10-CM

## 2021-07-30 ENCOUNTER — Ambulatory Visit
Admission: RE | Admit: 2021-07-30 | Discharge: 2021-07-30 | Disposition: A | Discharge status: Home / Self Care | Payer: No Typology Code available for payment source | Source: Ambulatory Visit | Attending: Obstetrics and Gynecology | Admitting: Obstetrics and Gynecology

## 2021-07-30 ENCOUNTER — Other Ambulatory Visit: Payer: Self-pay | Admitting: Obstetrics and Gynecology

## 2021-07-30 DIAGNOSIS — R921 Mammographic calcification found on diagnostic imaging of breast: Secondary | ICD-10-CM

## 2021-07-30 DIAGNOSIS — R928 Other abnormal and inconclusive findings on diagnostic imaging of breast: Secondary | ICD-10-CM

## 2021-08-10 ENCOUNTER — Ambulatory Visit
Admission: RE | Admit: 2021-08-10 | Discharge: 2021-08-10 | Disposition: A | Discharge status: Home / Self Care | Payer: No Typology Code available for payment source | Source: Ambulatory Visit | Attending: Obstetrics and Gynecology | Admitting: Obstetrics and Gynecology

## 2021-08-10 ENCOUNTER — Other Ambulatory Visit: Payer: Self-pay

## 2021-08-10 DIAGNOSIS — R921 Mammographic calcification found on diagnostic imaging of breast: Secondary | ICD-10-CM

## 2021-08-27 ENCOUNTER — Ambulatory Visit: Payer: Self-pay | Admitting: General Surgery

## 2021-08-27 DIAGNOSIS — R921 Mammographic calcification found on diagnostic imaging of breast: Secondary | ICD-10-CM

## 2021-09-02 ENCOUNTER — Other Ambulatory Visit: Payer: Self-pay | Admitting: General Surgery

## 2021-09-02 DIAGNOSIS — R921 Mammographic calcification found on diagnostic imaging of breast: Secondary | ICD-10-CM

## 2021-09-03 ENCOUNTER — Other Ambulatory Visit: Payer: Self-pay | Admitting: General Surgery

## 2021-09-03 DIAGNOSIS — E049 Nontoxic goiter, unspecified: Secondary | ICD-10-CM

## 2021-09-22 NOTE — Pre-Procedure Instructions (Signed)
Surgical Instructions ? ? ? Your procedure is scheduled on Thursday 10/01/21. ? ? [facility] at 06:30 A.M., then check in with the Admitting office. ? Call this number if you have problems the morning of surgery: ? [phone] ? ? If you have any questions prior to your surgery date call [phone]: Open Monday-Friday 8am-4pm ? ? ? Remember: ? Do not eat after midnight the night before your surgery ? ?You may drink clear liquids until 05:30 A.M. the morning of your surgery.   ?Clear liquids allowed are: Water, Non-Citrus Juices (without pulp), Carbonated Beverages, Clear Tea, Black Coffee ONLY (NO MILK, CREAM OR POWDERED CREAMER of any kind), and Gatorade ?  ? Take these medicines the morning of surgery with A SIP OF WATER:  ? amLODipine (NORVASC)  ? metoprolol succinate (TOPROL-XL)  ? pantoprazole (PROTONIX) ? ? methocarbamol (ROBAXIN)- If needed ? ?As of today, STOP taking any Aspirin (unless otherwise instructed by your surgeon) Aleve, Naproxen, Ibuprofen, Motrin, Advil, Goody's, BC's, all herbal medications, fish oil, and all vitamins. ? ?         ?Do not wear jewelry or makeup ?Do not wear lotions, powders, perfumes/colognes, or deodorant. ?Do not shave 48 hours prior to surgery.  Men may shave face and neck. ?Do not bring valuables to the hospital. ?Do not wear nail polish, gel polish, artificial nails, or any other type of covering on natural nails (fingers and toes) ?If you have artificial nails or gel coating that need to be removed by a nail salon, please have this removed prior to surgery. Artificial nails or gel coating may interfere with anesthesia's ability to adequately monitor your vital signs. ? ?[facility] is not responsible for any belongings or valuables. .  ? ?Do NOT Smoke (Tobacco/Vaping)  24 hours prior to your procedure ? ?If you use a CPAP at night, you may bring your mask for your overnight stay. ?  ?Contacts, glasses, hearing aids, dentures or partials may  not be worn into surgery, please bring cases for these belongings ?  ?For patients admitted to the hospital, discharge time will be determined by your treatment team. ?  ?Patients discharged the day of surgery will not be allowed to drive home, and someone needs to stay with them for 24 hours. ? ? ?SURGICAL WAITING ROOM VISITATION ?Patients having surgery or a procedure in a hospital may have two support people. ?Children under the age of 216 must have an adult with them who is not the patient. ?They may stay in the waiting area during the procedure and may switch out with other visitors. If the patient needs to stay at the hospital during part of their recovery, the visitor guidelines for inpatient rooms apply. ? ?Please refer to the [facility] website for the visitor guidelines for Inpatients (after your surgery is over and you are in a regular room).  ? ? ? ? ? ?Special instructions:   ? ?Oral Hygiene is also important to reduce your risk of infection.  Remember - BRUSH YOUR TEETH THE MORNING OF SURGERY WITH YOUR REGULAR TOOTHPASTE ? ? ?Jennifer Forbes- Preparing For Surgery ? ?Before surgery, you can play an important role. Because skin is not sterile, your skin needs to be as free of germs as possible. You can reduce the number of germs on your skin by washing with CHG (chlorahexidine gluconate) Soap before surgery.  CHG is an antiseptic cleaner which kills germs and bonds with the skin to continue killing  germs even after washing.   ? ? ?Please do not use if you have an allergy to CHG or antibacterial soaps. If your skin becomes reddened/irritated stop using the CHG.  ?Do not shave (including legs and underarms) for at least 48 hours prior to first CHG shower. It is OK to shave your face. ? ?Please follow these instructions carefully. ?  ? ? Shower the NIGHT BEFORE SURGERY and the MORNING OF SURGERY with CHG Soap.  ? If you chose to wash your hair, wash your hair first as usual with your normal shampoo. After you  shampoo, rinse your hair and body thoroughly to remove the shampoo.  Then [employer] and genitals (private parts) with your normal soap and rinse thoroughly to remove soap. ? ?After that Use CHG Soap as you would any other liquid soap. You can apply CHG directly to the skin and wash gently with a scrungie or a clean washcloth.  ? ?Apply the CHG Soap to your body ONLY FROM THE NECK DOWN.  Do not use on open wounds or open sores. Avoid contact with your eyes, ears, mouth and genitals (private parts). Wash Face and genitals (private parts)  with your normal soap.  ? ?Wash thoroughly, paying special attention to the area where your surgery will be performed. ? ?Thoroughly rinse your body with warm water from the neck down. ? ?DO NOT shower/wash with your normal soap after using and rinsing off the CHG Soap. ? ?Pat yourself dry with a CLEAN TOWEL. ? ?Wear CLEAN PAJAMAS to bed the night before surgery ? ?Place CLEAN SHEETS on your bed the night before your surgery ? ?DO NOT SLEEP WITH PETS. ? ? ?Day of Surgery: ? ?Take a shower with CHG soap. ?Wear Clean/Comfortable clothing the morning of surgery ?Do not apply any deodorants/lotions.   ?Remember to brush your teeth WITH YOUR REGULAR TOOTHPASTE. ? ? ? ?If you received a COVID test during your pre-op visit  it is requested that you wear a mask when out in public, stay away from anyone that may not be feeling well and notify your surgeon if you develop symptoms. If you have been in contact with anyone that has tested positive in the last 10 days please notify you surgeon. ? ?  ?Please read over the following fact sheets that you were given.  ? ?

## 2021-09-23 ENCOUNTER — Encounter (HOSPITAL_COMMUNITY)
Admission: RE | Admit: 2021-09-23 | Discharge: 2021-09-23 | Disposition: A | Discharge status: Home / Self Care | Source: Ambulatory Visit | Attending: General Surgery | Admitting: General Surgery

## 2021-09-23 ENCOUNTER — Other Ambulatory Visit: Payer: Self-pay

## 2021-09-23 ENCOUNTER — Encounter (HOSPITAL_COMMUNITY): Payer: Self-pay

## 2021-09-23 VITALS — BP 138/88 | HR 68 | Temp 97.7°F | Resp 18 | Ht 68.0 in | Wt 213.9 lb

## 2021-09-23 DIAGNOSIS — I10 Essential (primary) hypertension: Secondary | ICD-10-CM | POA: Diagnosis not present

## 2021-09-23 DIAGNOSIS — R921 Mammographic calcification found on diagnostic imaging of breast: Secondary | ICD-10-CM | POA: Diagnosis not present

## 2021-09-23 DIAGNOSIS — Z01818 Encounter for other preprocedural examination: Secondary | ICD-10-CM | POA: Diagnosis present

## 2021-09-23 DIAGNOSIS — I498 Other specified cardiac arrhythmias: Secondary | ICD-10-CM | POA: Diagnosis not present

## 2021-09-23 HISTORY — DX: Tachycardia, unspecified: R00.0

## 2021-09-23 HISTORY — DX: Headache, unspecified: R51.9

## 2021-09-23 LAB — CBC
HCT: 42.9 % (ref 36.0–46.0)
Hemoglobin: 14.3 g/dL (ref 12.0–15.0)
MCH: 32.5 pg (ref 26.0–34.0)
MCHC: 33.3 g/dL (ref 30.0–36.0)
MCV: 97.5 fL (ref 80.0–100.0)
Platelets: 286 10*3/uL (ref 150–400)
RBC: 4.4 MIL/uL (ref 3.87–5.11)
RDW: 15.2 % (ref 11.5–15.5)
WBC: 4.9 10*3/uL (ref 4.0–10.5)
nRBC: 0 % (ref 0.0–0.2)

## 2021-09-23 LAB — BASIC METABOLIC PANEL
Anion gap: 9 (ref 5–15)
BUN: 13 mg/dL (ref 6–20)
CO2: 20 mmol/L — ABNORMAL LOW (ref 22–32)
Calcium: 9 mg/dL (ref 8.9–10.3)
Chloride: 110 mmol/L (ref 98–111)
Creatinine, Ser: 0.65 mg/dL (ref 0.44–1.00)
GFR, Estimated: >60 mL/min (ref >60)
Glucose, Bld: 75 mg/dL (ref 70–99)
Potassium: 4 mmol/L (ref 3.5–5.1)
Sodium: 139 mmol/L (ref 135–145)

## 2021-09-24 NOTE — Anesthesia Preprocedure Evaluation (Addendum)
Anesthesia Evaluation  ?Patient identified by MRN, date of birth, ID band ?Patient awake ? ? ? ?Reviewed: ?Allergy & Precautions, NPO status , Patient's Chart, lab work & pertinent test results, reviewed documented beta blocker date and time  ? ?Airway ?Mallampati: II ? ?TM Distance: >3 FB ?Neck ROM: Full ? ? ? Dental ? ?(+) Dental Advisory Given, Chipped, Missing ?  ?Pulmonary ?Current Smoker and Patient abstained from smoking.,  ?  ?Pulmonary exam normal ?breath sounds clear to auscultation ? ? ? ? ? ? Cardiovascular ?hypertension, Pt. on medications and Pt. on home beta blockers ?Normal cardiovascular exam ?Rhythm:Regular Rate:Normal ? ? ?  ?Neuro/Psych ? Headaches, Seizures - (As a child),    ? GI/Hepatic ?Neg liver ROS, GERD  Medicated and Controlled,  ?Endo/Other  ?Obesity ? ? ? Renal/GU ?negative Renal ROS  ? ?  ?Musculoskeletal ? ?(+) Arthritis ,  ? Abdominal ?  ?Peds ? Hematology ?negative hematology ROS ?(+)   ?Anesthesia Other Findings ? ? Reproductive/Obstetrics ? ?  ? ? ? ? ? ? ? ? ? ? ? ? ? ?  ?  ? ? ? ? ? ? ?Anesthesia Physical ?Anesthesia Plan ? ?ASA: 2 ? ?Anesthesia Plan: General  ? ?Post-op Pain Management: Tylenol PO (pre-op)* and Gabapentin PO (pre-op)*  ? ?Induction: Intravenous ? ?PONV Risk Score and Plan: 2 and Midazolam, Dexamethasone and Ondansetron ? ?Airway Management Planned: LMA ? ?Additional Equipment:  ? ?Intra-op Plan:  ? ?Post-operative Plan: Extubation in OR ? ?Informed Consent: I have reviewed the patients History and Physical, chart, labs and discussed the procedure including the risks, benefits and alternatives for the proposed anesthesia with the patient or authorized representative who has indicated his/her understanding and acceptance.  ? ? ? ?Dental advisory given ? ?Plan Discussed with: CRNA ? ?Anesthesia Plan Comments: (PAT note written 09/24/2021 by Shonna ChockAllison Zelenak, PA-C. ?)  ? ? ? ? ?Anesthesia Quick Evaluation ? ?

## 2021-09-24 NOTE — Progress Notes (Signed)
Anesthesia Chart Review: ? Case: [id number] Date/Time: 10/01/21 0815  ? Procedure: LEFT BREAST LUMPECTOMY WITH RADIOACTIVE SEED LOCALIZATION (Left: Breast)  ? Anesthesia type: General  ? Pre-op diagnosis: LEFT BREAST CALCIFICATIONS  ? Location: [facility] ROOM 09 / [facility] OR  ? Surgeons: Griselda Mineroth, Paul III, MD  ? ?  ? ? ?DISCUSSION: Patient is a 51 year old female scheduled for the above procedure.  08/10/2021 left breast biopsy pathology showed fibrocystic change with calcifications. Per Dr. Billey Changoth's 08/27/21 consult note, "The clip migrated away from the area. The radiologist felt that this was a discordant finding and recommended excision of the area. She does have a history of breast cancer in her sister and maternal aunt."  Because of this a left breast lumpectomy was recommended.  He also ordered a ultrasound of her thyroid gland with referral to Dr. Darnell Levelodd Gerkin. ? ?Other history includes smoking, HTN, tachycardia, arthritis, thyromegaly (2021 US suggestive of multinodular goiter).  BMI is consistent with obesity. ? ?LMP > 2 years ago and is reportedly postmenopausal.  RSL scheduled for 09/30/21 at 1:00 PM at the [facility].  Anesthesia team to evaluate on the day of surgery. ? ? ?VS: BP 138/88   Pulse 68   Temp 36.5 ?C (Oral)   Resp 18   Ht 5\' 8"  (1.727 m)   Wt 97 kg   LMP 01/20/2019   SpO2 100%   BMI 32.52 kg/m? "Postmenopausal" ? ?PROVIDERS: ?Barbette MerinoKing, Crystal M, NP is PCP ? ? ?LABS: Labs reviewed: Acceptable for surgery. ?(all labs ordered are listed, but only abnormal results are displayed) ? ?Labs Reviewed  ?BASIC METABOLIC PANEL - Abnormal; Notable for the following components:  ?    Result Value  ? CO2 20 (*)   ? All other components within normal limits  ?CBC  ? ? ? ?IMAGES: ?US Thyroid 11/28/19: ?IMPRESSION: ?1. Thyromegaly with findings suggestive of multinodular goiter. ?2. Nodules #2 and #4 both meet imaging criteria to recommend ?percutaneous sampling as indicated. ?3. Nodules #1, #3 and #6 all meet imaging criteria to  recommend a 1 ?year follow-up as indicated. ?- The above is in keeping with the ACR TI-RADS recommendations - J Am ?Coll Radiol 2017;14:587-595. ?  ? ?EKG: 09/23/21: ?Normal sinus rhythm with sinus arrhythmia ?When compared with ECG of 04-Aug-2020 09:07, ?No significant change was found ?Confirmed by Rollene RotundaHochrein, James ([id number]) on 09/23/2021 7:43:53 PM ? ? ?CV: N/A ? ?Past Medical History:  ?Diagnosis Date  ? Arthritis   ? Left knee  ? Headache   ? Hypertension   ? Tachycardia   ? Per patient  ? ? ?Past Surgical History:  ?Procedure Laterality Date  ? BREAST BIOPSY  2015  ? left breast  ? LIPOMA RESECTION  march 2014  ? right shoulder   ? SKIN BIOPSY    ? ? ?MEDICATIONS: ? amLODipine (NORVASC) 5 MG tablet  ? Aspirin-Salicylamide-Caffeine (BC HEADACHE POWDER PO)  ? methocarbamol (ROBAXIN) 500 MG tablet  ? metoprolol succinate (TOPROL-XL) 25 MG 24 hr tablet  ? pantoprazole (PROTONIX) 40 MG tablet  ? ?No current facility-administered medications for this encounter.  ? ? ?Shonna ChockAllison Dashley Monts, PA-C ?Surgical Short Stay/Anesthesiology ?[facility] Phone [phone] ?[facility] Phone [phone] ?09/24/2021 3:32 PM ? ? ? ? ? ?

## 2021-09-29 ENCOUNTER — Ambulatory Visit: Payer: No Typology Code available for payment source

## 2021-09-30 ENCOUNTER — Ambulatory Visit
Admission: RE | Admit: 2021-09-30 | Discharge: 2021-09-30 | Disposition: A | Discharge status: Home / Self Care | Source: Ambulatory Visit | Attending: General Surgery | Admitting: General Surgery

## 2021-09-30 DIAGNOSIS — R921 Mammographic calcification found on diagnostic imaging of breast: Secondary | ICD-10-CM

## 2021-10-01 ENCOUNTER — Encounter (HOSPITAL_COMMUNITY): Payer: Self-pay | Admitting: General Surgery

## 2021-10-01 ENCOUNTER — Ambulatory Visit
Admission: RE | Admit: 2021-10-01 | Discharge: 2021-10-01 | Disposition: A | Discharge status: Home / Self Care | Payer: No Typology Code available for payment source | Source: Ambulatory Visit | Attending: General Surgery | Admitting: General Surgery

## 2021-10-01 ENCOUNTER — Ambulatory Visit (HOSPITAL_COMMUNITY)
Admission: RE | Admit: 2021-10-01 | Discharge: 2021-10-01 | Disposition: A | Discharge status: Home / Self Care | Attending: General Surgery | Admitting: General Surgery

## 2021-10-01 ENCOUNTER — Other Ambulatory Visit: Payer: Self-pay

## 2021-10-01 ENCOUNTER — Ambulatory Visit (HOSPITAL_COMMUNITY): Admitting: Vascular Surgery

## 2021-10-01 ENCOUNTER — Encounter (HOSPITAL_COMMUNITY)
Admission: RE | Disposition: A | Discharge status: Home / Self Care | Payer: Self-pay | Source: Home / Self Care | Attending: General Surgery

## 2021-10-01 ENCOUNTER — Ambulatory Visit (HOSPITAL_BASED_OUTPATIENT_CLINIC_OR_DEPARTMENT_OTHER): Admitting: Anesthesiology

## 2021-10-01 DIAGNOSIS — Z6834 Body mass index (BMI) 34.0-34.9, adult: Secondary | ICD-10-CM | POA: Insufficient documentation

## 2021-10-01 DIAGNOSIS — Z803 Family history of malignant neoplasm of breast: Secondary | ICD-10-CM | POA: Insufficient documentation

## 2021-10-01 DIAGNOSIS — R921 Mammographic calcification found on diagnostic imaging of breast: Secondary | ICD-10-CM | POA: Diagnosis present

## 2021-10-01 DIAGNOSIS — K219 Gastro-esophageal reflux disease without esophagitis: Secondary | ICD-10-CM | POA: Diagnosis not present

## 2021-10-01 DIAGNOSIS — N6082 Other benign mammary dysplasias of left breast: Secondary | ICD-10-CM | POA: Diagnosis not present

## 2021-10-01 DIAGNOSIS — N6489 Other specified disorders of breast: Secondary | ICD-10-CM | POA: Diagnosis not present

## 2021-10-01 DIAGNOSIS — E669 Obesity, unspecified: Secondary | ICD-10-CM | POA: Diagnosis not present

## 2021-10-01 DIAGNOSIS — F17290 Nicotine dependence, other tobacco product, uncomplicated: Secondary | ICD-10-CM | POA: Diagnosis not present

## 2021-10-01 DIAGNOSIS — I10 Essential (primary) hypertension: Secondary | ICD-10-CM | POA: Insufficient documentation

## 2021-10-01 HISTORY — PX: BREAST LUMPECTOMY WITH RADIOACTIVE SEED LOCALIZATION: SHX6424

## 2021-10-01 SURGERY — BREAST LUMPECTOMY WITH RADIOACTIVE SEED LOCALIZATION
Anesthesia: General | Site: Breast | Laterality: Left

## 2021-10-01 MED ORDER — DEXAMETHASONE SODIUM PHOSPHATE 10 MG/ML IJ SOLN
INTRAMUSCULAR | Status: DC | PRN
  Administered 2021-10-01: 10 mg via INTRAVENOUS

## 2021-10-01 MED ORDER — DIPHENHYDRAMINE HCL 50 MG/ML IJ SOLN
INTRAMUSCULAR | Status: AC
  Filled 2021-10-01: qty 1

## 2021-10-01 MED ORDER — ESMOLOL HCL 100 MG/10ML IV SOLN
INTRAVENOUS | Status: AC
  Filled 2021-10-01: qty 10

## 2021-10-01 MED ORDER — FENTANYL CITRATE (PF) 250 MCG/5ML IJ SOLN
INTRAMUSCULAR | Status: AC
  Filled 2021-10-01: qty 5

## 2021-10-01 MED ORDER — 0.9 % SODIUM CHLORIDE (POUR BTL) OPTIME
TOPICAL | Status: DC | PRN
  Administered 2021-10-01: 500 mL

## 2021-10-01 MED ORDER — ROCURONIUM BROMIDE 10 MG/ML (PF) SYRINGE
PREFILLED_SYRINGE | INTRAVENOUS | Status: AC
  Filled 2021-10-01: qty 10

## 2021-10-01 MED ORDER — ONDANSETRON HCL 4 MG/2ML IJ SOLN
INTRAMUSCULAR | Status: DC | PRN
  Administered 2021-10-01: 4 mg via INTRAVENOUS

## 2021-10-01 MED ORDER — DEXAMETHASONE SODIUM PHOSPHATE 10 MG/ML IJ SOLN
INTRAMUSCULAR | Status: AC
  Filled 2021-10-01: qty 3

## 2021-10-01 MED ORDER — FENTANYL CITRATE (PF) 100 MCG/2ML IJ SOLN
INTRAMUSCULAR | Status: AC
  Filled 2021-10-01: qty 2

## 2021-10-01 MED ORDER — BUPIVACAINE-EPINEPHRINE (PF) 0.25% -1:200000 IJ SOLN
INTRAMUSCULAR | Status: AC
  Filled 2021-10-01: qty 30

## 2021-10-01 MED ORDER — FENTANYL CITRATE (PF) 250 MCG/5ML IJ SOLN
INTRAMUSCULAR | Status: DC | PRN
  Administered 2021-10-01: 50 ug via INTRAVENOUS
  Administered 2021-10-01: 25 ug via INTRAVENOUS

## 2021-10-01 MED ORDER — BUPIVACAINE-EPINEPHRINE 0.25% -1:200000 IJ SOLN
INTRAMUSCULAR | Status: DC | PRN
  Administered 2021-10-01 (×2): 10 mL

## 2021-10-01 MED ORDER — MIDAZOLAM HCL 2 MG/2ML IJ SOLN
INTRAMUSCULAR | Status: DC | PRN
  Administered 2021-10-01: 2 mg via INTRAVENOUS

## 2021-10-01 MED ORDER — LIDOCAINE 2% (20 MG/ML) 5 ML SYRINGE
INTRAMUSCULAR | Status: AC
  Filled 2021-10-01: qty 15

## 2021-10-01 MED ORDER — PROPOFOL 10 MG/ML IV BOLUS
INTRAVENOUS | Status: DC | PRN
  Administered 2021-10-01: 180 mg via INTRAVENOUS

## 2021-10-01 MED ORDER — CELECOXIB 200 MG PO CAPS
200.0000 mg | ORAL_CAPSULE | ORAL | Status: AC
  Administered 2021-10-01: 200 mg via ORAL
  Filled 2021-10-01: qty 1

## 2021-10-01 MED ORDER — PHENYLEPHRINE 80 MCG/ML (10ML) SYRINGE FOR IV PUSH (FOR BLOOD PRESSURE SUPPORT)
PREFILLED_SYRINGE | INTRAVENOUS | Status: DC | PRN
  Administered 2021-10-01 (×4): 80 ug via INTRAVENOUS

## 2021-10-01 MED ORDER — VANCOMYCIN HCL IN DEXTROSE 1-5 GM/200ML-% IV SOLN
1000.0000 mg | INTRAVENOUS | Status: AC
  Administered 2021-10-01: 1000 mg via INTRAVENOUS
  Filled 2021-10-01: qty 200

## 2021-10-01 MED ORDER — CHLORHEXIDINE GLUCONATE CLOTH 2 % EX PADS: 6.0000 | MEDICATED_PAD | Freq: Once | CUTANEOUS | Status: DC

## 2021-10-01 MED ORDER — OXYCODONE HCL 5 MG PO TABS
ORAL_TABLET | ORAL | Status: AC
  Filled 2021-10-01: qty 1

## 2021-10-01 MED ORDER — OXYCODONE HCL 5 MG PO TABS
5.0000 mg | ORAL_TABLET | Freq: Four times a day (QID) | ORAL | Status: AC | PRN
  Administered 2021-10-01: 5 mg via ORAL

## 2021-10-01 MED ORDER — MIDAZOLAM HCL 2 MG/2ML IJ SOLN
INTRAMUSCULAR | Status: AC
  Filled 2021-10-01: qty 2

## 2021-10-01 MED ORDER — LIDOCAINE 2% (20 MG/ML) 5 ML SYRINGE
INTRAMUSCULAR | Status: DC | PRN
  Administered 2021-10-01: 80 mg via INTRAVENOUS

## 2021-10-01 MED ORDER — ORAL CARE MOUTH RINSE: 15.0000 mL | Freq: Once | OROMUCOSAL | Status: AC

## 2021-10-01 MED ORDER — PROPOFOL 10 MG/ML IV BOLUS
INTRAVENOUS | Status: AC
  Filled 2021-10-01: qty 20

## 2021-10-01 MED ORDER — CHLORHEXIDINE GLUCONATE 0.12 % MT SOLN
15.0000 mL | Freq: Once | OROMUCOSAL | Status: AC
  Administered 2021-10-01: 15 mL via OROMUCOSAL
  Filled 2021-10-01: qty 15

## 2021-10-01 MED ORDER — FENTANYL CITRATE (PF) 100 MCG/2ML IJ SOLN
25.0000 ug | INTRAMUSCULAR | Status: DC | PRN
  Administered 2021-10-01: 50 ug via INTRAVENOUS

## 2021-10-01 MED ORDER — PHENYLEPHRINE 80 MCG/ML (10ML) SYRINGE FOR IV PUSH (FOR BLOOD PRESSURE SUPPORT)
PREFILLED_SYRINGE | INTRAVENOUS | Status: AC
  Filled 2021-10-01: qty 30

## 2021-10-01 MED ORDER — OXYCODONE HCL 5 MG PO TABS: 5.0000 mg | ORAL_TABLET | Freq: Four times a day (QID) | ORAL | 0 refills | Status: DC | PRN

## 2021-10-01 MED ORDER — ACETAMINOPHEN 500 MG PO TABS
1000.0000 mg | ORAL_TABLET | ORAL | Status: AC
  Administered 2021-10-01: 1000 mg via ORAL
  Filled 2021-10-01: qty 2

## 2021-10-01 MED ORDER — LACTATED RINGERS IV SOLN: INTRAVENOUS | Status: DC

## 2021-10-01 MED ORDER — GABAPENTIN 300 MG PO CAPS
300.0000 mg | ORAL_CAPSULE | ORAL | Status: AC
  Administered 2021-10-01: 300 mg via ORAL
  Filled 2021-10-01: qty 1

## 2021-10-01 MED ORDER — ONDANSETRON HCL 4 MG/2ML IJ SOLN: 4.0000 mg | Freq: Once | INTRAMUSCULAR | Status: DC | PRN

## 2021-10-01 MED ORDER — ONDANSETRON HCL 4 MG/2ML IJ SOLN
INTRAMUSCULAR | Status: AC
  Filled 2021-10-01: qty 6

## 2021-10-01 SURGICAL SUPPLY — 26 items
BAG COUNTER SPONGE SURGICOUNT (BAG) ×1 IMPLANT
CANISTER SUCT 3000ML PPV (MISCELLANEOUS) ×2 IMPLANT
CHLORAPREP W/TINT 26 (MISCELLANEOUS) ×2 IMPLANT
COVER PROBE W GEL 5X96 (DRAPES) ×2 IMPLANT
COVER SURGICAL LIGHT HANDLE (MISCELLANEOUS) ×2 IMPLANT
DERMABOND ADVANCED (GAUZE/BANDAGES/DRESSINGS) ×1
DERMABOND ADVANCED .7 DNX12 (GAUZE/BANDAGES/DRESSINGS) ×1 IMPLANT
DEVICE DUBIN SPECIMEN MAMMOGRA (MISCELLANEOUS) ×2 IMPLANT
DRAPE CHEST BREAST 15X10 FENES (DRAPES) ×2 IMPLANT
ELECT COATED BLADE 2.86 ST (ELECTRODE) ×2 IMPLANT
ELECT REM PT RETURN 9FT ADLT (ELECTROSURGICAL) ×2
ELECTRODE REM PT RTRN 9FT ADLT (ELECTROSURGICAL) ×1 IMPLANT
GLOVE BIO SURGEON STRL SZ7.5 (GLOVE) ×4 IMPLANT
GOWN STRL REUS W/ TWL LRG LVL3 (GOWN DISPOSABLE) ×2 IMPLANT
GOWN STRL REUS W/TWL LRG LVL3 (GOWN DISPOSABLE) ×2
KIT BASIN OR (CUSTOM PROCEDURE TRAY) ×2 IMPLANT
KIT MARKER MARGIN INK (KITS) ×2 IMPLANT
NDL HYPO 25GX1X1/2 BEV (NEEDLE) ×1 IMPLANT
NEEDLE HYPO 25GX1X1/2 BEV (NEEDLE) ×2 IMPLANT
NS IRRIG 1000ML POUR BTL (IV SOLUTION) ×2 IMPLANT
PACK GENERAL/GYN (CUSTOM PROCEDURE TRAY) ×2 IMPLANT
SUT MNCRL AB 4-0 PS2 18 (SUTURE) ×2 IMPLANT
SUT VIC AB 3-0 SH 18 (SUTURE) ×2 IMPLANT
SYR CONTROL 10ML LL (SYRINGE) ×2 IMPLANT
TOWEL GREEN STERILE (TOWEL DISPOSABLE) ×2 IMPLANT
TOWEL GREEN STERILE FF (TOWEL DISPOSABLE) ×2 IMPLANT

## 2021-10-01 NOTE — Transfer of Care (Signed)
Immediate Anesthesia Transfer of Care Note ? ?Patient: Jennifer Forbes ? ?Procedure(s) Performed: LEFT BREAST LUMPECTOMY WITH RADIOACTIVE SEED LOCALIZATION (Left: Breast) ? ?Patient Location: PACU ? ?Anesthesia Type:General ? ?Level of Consciousness: awake, alert  and oriented ? ?Airway & Oxygen Therapy: Patient Spontanous Breathing ? ?Post-op Assessment: Report given to RN and Post -op Vital signs reviewed and stable ? ?Post vital signs: Reviewed and stable ? ?Last Vitals:  ?Vitals Value Taken Time  ?BP 137/70 10/01/21 0916  ?Temp    ?Pulse 69 10/01/21 0917  ?Resp 20 10/01/21 0917  ?SpO2 97 % 10/01/21 0917  ?Vitals shown include unvalidated device data. ? ?Last Pain:  ?Vitals:  ? 10/01/21 0656  ?TempSrc:   ?PainSc: 0-No pain  ?   ? ?  ? ?Complications: No notable events documented. ?

## 2021-10-01 NOTE — Anesthesia Postprocedure Evaluation (Signed)
Anesthesia Post Note ? ?Patient: Jennifer FlattenBennie M Forbes ? ?Procedure(s) Performed: LEFT BREAST LUMPECTOMY WITH RADIOACTIVE SEED LOCALIZATION (Left: Breast) ? ?  ? ?Patient location during evaluation: PACU ?Anesthesia Type: General ?Level of consciousness: awake and alert ?Pain management: pain level controlled ?Vital Signs Assessment: post-procedure vital signs reviewed and stable ?Respiratory status: spontaneous breathing, nonlabored ventilation, respiratory function stable and patient connected to nasal cannula oxygen ?Cardiovascular status: blood pressure returned to baseline and stable ?Postop Assessment: no apparent nausea or vomiting ?Anesthetic complications: no ? ? ?No notable events documented. ? ?Last Vitals:  ?Vitals:  ? 10/01/21 0930 10/01/21 0945  ?BP: (!) 148/70 (!) 155/76  ?Pulse: 65 64  ?Resp: 14   ?Temp:  36.5 ?C  ?SpO2: 97%   ?  ?Last Pain:  ?Vitals:  ? 10/01/21 0945  ?TempSrc:   ?PainSc: 0-No pain  ? ? ?  ?  ?  ?  ?  ?  ? ?Collene SchlichterStephen E Callin Ashe ? ? ? ? ?

## 2021-10-01 NOTE — H&P (Signed)
?REFERRING PHYSICIAN: Constant, Peggy, MD ? ?PROVIDER: Lindell Noe, MD ? ?MRN: [id number] ?DOB: [birth year] ?Subjective  ? ?Chief Complaint: New Patient (Left breast excision ) ? ? ?History of Present Illness: ?Jennifer Forbes is a 51 y.o. female who is seen today as an office consultation for evaluation of New Patient (Left breast excision ) ?.  ? ?We are asked to see the patient in consultation by Dr. Gigi Gin constant to evaluate her for left breast calcifications. The patient is a 51 year old black female who recently went for a routine screening mammogram. At that time she was found to have new calcifications in the central medial left breast that covered 2.9 cm. The calcifications were biopsied and came back as fibrocystic disease with calcification. The clip migrated away from the area. The radiologist felt that this was a discordant finding and recommended excision of the area. She does have a history of breast cancer in her sister and maternal aunt. She does smoke about 4 cigars a day. ? ?Review of Systems: ?A complete review of systems was obtained from the patient. I have reviewed this information and discussed as appropriate with the patient. See HPI as well for other ROS. ? ?ROS  ? ?Medical History: ?Past Medical History:  ?Diagnosis Date  ? Arthritis  ? GERD (gastroesophageal reflux disease)  ? Hypertension  ? Seizures (CMS-HCC)  ? ?Patient Active Problem List  ?Diagnosis  ? Calcification of left breast  ? ?Past Surgical History:  ?Procedure Laterality Date  ? lipo right shoulder  ? ? ?Allergies  ?Allergen Reactions  ? Amoxicillin Rash  ? ?Current Outpatient Medications on File Prior to Visit  ?Medication Sig Dispense Refill  ? amLODIPine (NORVASC) 5 MG tablet Take 5 mg by mouth once daily  ? methocarbamoL (ROBAXIN) 500 MG tablet TAKE 1 TABLET (500 MG TOTAL) BY MOUTH AT BEDTIME AS NEEDED FOR MUSCLE SPASMS.  ? metoprolol succinate (TOPROL-XL) 25 MG XL tablet Take 12.5 mg by mouth once daily  ? ?No current  facility-administered medications on file prior to visit.  ? ?Family History  ?Problem Relation Age of Onset  ? High blood pressure (Hypertension) Mother  ? Stroke Father  ? Coronary Artery Disease (Blocked arteries around heart) Father  ? Deep vein thrombosis (DVT or abnormal blood clot formation) Sister  ? Breast cancer Sister  ? Diabetes Brother  ? ? ?Social History  ? ?Tobacco Use  ?Smoking Status Every Day  ? Types: Cigarettes  ?Smokeless Tobacco Current  ? ? ?Social History  ? ?Socioeconomic History  ? Marital status: Legally Separated  ?Tobacco Use  ? Smoking status: Every Day  ?Types: Cigarettes  ? Smokeless tobacco: Current  ?Substance and Sexual Activity  ? Alcohol use: Never  ? Drug use: Never  ? ?Objective:  ? ?Vitals:  ?BP: (!) 140/86  ?Pulse: 84  ?Temp: 36.8 ?C (98.2 ?F)  ?SpO2: 98%  ?Weight: 98.6 kg (217 lb 6.4 oz)  ?Height: 170.2 cm ( )  ? ?Body mass index is 34.05 kg/m?. ? ?Physical Exam ?Vitals reviewed.  ?Constitutional:  ?General: She is not in acute distress. ?Appearance: Normal appearance.  ?HENT:  ?Head: Normocephalic and atraumatic.  ?Right Ear: External ear normal.  ?Left Ear: External ear normal.  ?Nose: Nose normal.  ?Mouth/Throat:  ?Mouth: Mucous membranes are moist.  ?Pharynx: Oropharynx is clear.  ?Eyes:  ?General: No scleral icterus. ?Extraocular Movements: Extraocular movements intact.  ?Conjunctiva/sclera: Conjunctivae normal.  ?Pupils: Pupils are equal, round, and reactive to light.  ?Cardiovascular:  ?Rate and  Rhythm: Normal rate and regular rhythm.  ?Pulses: Normal pulses.  ?Heart sounds: Normal heart sounds.  ?Pulmonary:  ?Effort: Pulmonary effort is normal. No respiratory distress.  ?Breath sounds: Normal breath sounds.  ?Abdominal:  ?General: Bowel sounds are normal.  ?Palpations: Abdomen is soft.  ?Tenderness: There is no abdominal tenderness.  ?Musculoskeletal:  ?General: No swelling, tenderness or deformity. Normal range of motion.  ?Cervical back: Normal range of motion  and neck supple.  ?Skin: ?General: Skin is warm and dry.  ?Coloration: Skin is not jaundiced.  ?Neurological:  ?General: No focal deficit present.  ?Mental Status: She is alert and oriented to person, place, and time.  ?Psychiatric:  ?Mood and Affect: Mood normal.  ?Behavior: Behavior normal.  ? ? ? ?Breast: There is no palpable mass in either breast. There is no palpable axillary, supraclavicular, or cervical lymphadenopathy. She does have some enlargement of the right lobe of her thyroid gland. ? ?Labs, Imaging and Diagnostic Testing: ? ?Assessment and Plan:  ? ?Diagnoses and all orders for this visit: ? ?Calcification of left breast ?- [facility] Case Posting Request; Future ? ? ? ?The patient appears to have some new calcification in the central portion of the left breast that was benign but felt to be discordant by the radiologist. Because of this the recommendation is to have this area removed. I have discussed with her in detail the risks and benefits of the operation as well as some of the technical aspects including the use of a radioactive seed for localization and she understands and wishes to proceed. I will also order an ultrasound of her thyroid gland and refer her to Dr. Gerrit Friends in our practice. ?

## 2021-10-01 NOTE — Anesthesia Procedure Notes (Signed)
Procedure Name: LMA Insertion ?Date/Time: 10/01/2021 8:28 AM ?Performed by: Dairl Ponder, CRNA ?Pre-anesthesia Checklist: Patient identified, Emergency Drugs available, Suction available and Patient being monitored ?Patient Re-evaluated:Patient Re-evaluated prior to induction ?Oxygen Delivery Method: Circle System Utilized ?Preoxygenation: Pre-oxygenation with 100% oxygen ?Induction Type: IV induction ?Ventilation: Mask ventilation without difficulty ?LMA: LMA inserted ?LMA Size: 4.0 ?Number of attempts: 1 ?Airway Equipment and Method: Bite block ?Placement Confirmation: positive ETCO2 ?Tube secured with: Tape ?Dental Injury: Teeth and Oropharynx as per pre-operative assessment  ? ? ? ? ?

## 2021-10-01 NOTE — Op Note (Signed)
10/01/2021 ? ?9:21 AM ? ?PATIENT:  Jennifer FlattenBennie M Khaimov  51 y.o. female ? ?PRE-OPERATIVE DIAGNOSIS:  LEFT BREAST CALCIFICATIONS ? ?POST-OPERATIVE DIAGNOSIS:  LEFT BREAST CALCIFICATIONS ? ?PROCEDURE:  Procedure(s): ?LEFT BREAST LUMPECTOMY WITH RADIOACTIVE SEED LOCALIZATION (Left) ? ?SURGEON:  Surgeon(s) and Role: ?   Griselda Miner* Toth, Tylynn Braniff III, MD - Primary ? ?PHYSICIAN ASSISTANT:  ? ?ASSISTANTS: none  ? ?ANESTHESIA:   local and general ? ?EBL:  2 mL  ? ?BLOOD ADMINISTERED:none ? ?DRAINS: none  ? ?LOCAL MEDICATIONS USED:  MARCAINE    ? ?SPECIMEN:  Source of Specimen:  left breast tissue ? ?DISPOSITION OF SPECIMEN:  PATHOLOGY ? ?COUNTS:  YES ? ?TOURNIQUET:  * No tourniquets in log * ? ?DICTATION: .Dragon Dictation ? ?After informed consent was obtained the patient was brought to the operating room and placed in the supine position on the operating table.  After adequate induction of general anesthesia the patient's left breast was prepped with ChloraPrep, allowed to dry, and draped in usual sterile manner.  An appropriate timeout was performed.  Previously an I-125 seed was placed in the upper central portion of the left breast to mark an area of discordant calcifications.  The neoprobe was set to I-125 in the area of radioactivity was readily identified.  The area around this was infiltrated with quarter percent Marcaine.  A curvilinear incision was made along the upper edge of the areola of the left breast with a 15 blade knife.  The incision was carried through the skin and subcutaneous tissue sharply with the electrocautery.  Dissection was then carried sharply with the electrocautery towards the radioactive seed under the direction of the neoprobe.  Once I more closely approached the radioactive seed I then removed a circular portion of breast tissue sharply with the electrocautery around the radioactive seed while checking the area of radioactivity frequently.  Once the specimen was removed it was oriented with the appropriate  paint colors.  A specimen radiograph was obtained that showed the calcifications in seed to be near the center of the specimen.  The clip had previously migrated away.  The specimen was then sent to pathology for further evaluation.  Hemostasis was achieved using the Bovie electrocautery.  The wound was irrigated with saline and infiltrated with more quarter percent Marcaine.  The deep layer of the wound was closed with layers of interrupted 3-0 Vicryl stitches.  The skin was closed with interrupted 4-0 Monocryl subcuticular stitches.  Dermabond dressings were applied.  The patient tolerated the procedure well.  At the end of the case all needle sponge and instrument counts were correct.  The patient was then awakened and taken recovery in stable condition. ? ?PLAN OF CARE: Discharge to home after PACU ? ?PATIENT DISPOSITION:  PACU - hemodynamically stable. ?  ?Delay start of Pharmacological VTE agent (>24hrs) due to surgical blood loss or risk of bleeding: not applicable ? ?

## 2021-10-01 NOTE — Interval H&P Note (Signed)
History and Physical Interval Note: ? ?10/01/2021 ?8:10 AM ? ?ARNA BERRIOS  has presented today for surgery, with the diagnosis of LEFT BREAST CALCIFICATIONS.  The various methods of treatment have been discussed with the patient and family. After consideration of risks, benefits and other options for treatment, the patient has consented to  Procedure(s): ?LEFT BREAST LUMPECTOMY WITH RADIOACTIVE SEED LOCALIZATION (Left) as a surgical intervention.  The patient's history has been reviewed, patient examined, no change in status, stable for surgery.  I have reviewed the patient's chart and labs.  Questions were answered to the patient's satisfaction.   ? ? ?Chevis Pretty III ? ? ?

## 2021-10-02 ENCOUNTER — Encounter (HOSPITAL_COMMUNITY): Payer: Self-pay | Admitting: General Surgery

## 2021-10-05 LAB — SURGICAL PATHOLOGY

## 2021-10-13 ENCOUNTER — Encounter (HOSPITAL_COMMUNITY): Payer: Self-pay

## 2021-10-26 ENCOUNTER — Other Ambulatory Visit: Payer: Self-pay | Admitting: General Surgery

## 2021-10-26 DIAGNOSIS — R921 Mammographic calcification found on diagnostic imaging of breast: Secondary | ICD-10-CM

## 2021-11-18 ENCOUNTER — Other Ambulatory Visit: Payer: Self-pay | Admitting: General Surgery

## 2021-11-18 DIAGNOSIS — N6320 Unspecified lump in the left breast, unspecified quadrant: Secondary | ICD-10-CM

## 2021-11-20 ENCOUNTER — Other Ambulatory Visit

## 2021-11-25 ENCOUNTER — Ambulatory Visit
Admission: RE | Admit: 2021-11-25 | Discharge: 2021-11-25 | Disposition: A | Discharge status: Home / Self Care | Source: Ambulatory Visit | Attending: General Surgery | Admitting: General Surgery

## 2021-11-25 DIAGNOSIS — N6320 Unspecified lump in the left breast, unspecified quadrant: Secondary | ICD-10-CM

## 2021-11-28 ENCOUNTER — Other Ambulatory Visit: Payer: Self-pay | Admitting: Nurse Practitioner

## 2021-11-28 DIAGNOSIS — I10 Essential (primary) hypertension: Secondary | ICD-10-CM

## 2021-11-28 DIAGNOSIS — R000 Tachycardia, unspecified: Secondary | ICD-10-CM

## 2021-12-02 ENCOUNTER — Other Ambulatory Visit: Payer: Self-pay | Admitting: Nurse Practitioner

## 2021-12-02 DIAGNOSIS — R000 Tachycardia, unspecified: Secondary | ICD-10-CM

## 2021-12-02 DIAGNOSIS — I10 Essential (primary) hypertension: Secondary | ICD-10-CM

## 2021-12-06 ENCOUNTER — Other Ambulatory Visit: Payer: Self-pay | Admitting: Nurse Practitioner

## 2021-12-06 DIAGNOSIS — I10 Essential (primary) hypertension: Secondary | ICD-10-CM

## 2021-12-06 DIAGNOSIS — R000 Tachycardia, unspecified: Secondary | ICD-10-CM

## 2021-12-14 ENCOUNTER — Telehealth: Payer: Self-pay

## 2021-12-14 ENCOUNTER — Other Ambulatory Visit: Payer: Self-pay

## 2021-12-14 DIAGNOSIS — I10 Essential (primary) hypertension: Secondary | ICD-10-CM

## 2021-12-14 DIAGNOSIS — R000 Tachycardia, unspecified: Secondary | ICD-10-CM

## 2021-12-14 MED ORDER — AMLODIPINE BESYLATE 5 MG PO TABS: 5.0000 mg | ORAL_TABLET | Freq: Every day | ORAL | 0 refills | Status: DC

## 2021-12-14 MED ORDER — METOPROLOL SUCCINATE ER 25 MG PO TB24: 12.5000 mg | ORAL_TABLET | Freq: Every day | ORAL | 0 refills | Status: DC

## 2021-12-14 NOTE — Telephone Encounter (Signed)
Sent refills to the pharmacy.  Gave pt enough to  last until  her appt , called and notified pt of this .

## 2021-12-14 NOTE — Telephone Encounter (Signed)
Amlodipine Metoprolol succinate   Pt just made a appt

## 2021-12-28 ENCOUNTER — Ambulatory Visit: Payer: PRIVATE HEALTH INSURANCE | Admitting: Nurse Practitioner

## 2022-01-15 ENCOUNTER — Ambulatory Visit (INDEPENDENT_AMBULATORY_CARE_PROVIDER_SITE_OTHER): Admitting: Nurse Practitioner

## 2022-01-15 ENCOUNTER — Encounter: Payer: Self-pay | Admitting: Nurse Practitioner

## 2022-01-15 VITALS — BP 131/79 | HR 88 | Temp 98.1°F | Ht 68.0 in | Wt 207.4 lb

## 2022-01-15 DIAGNOSIS — E042 Nontoxic multinodular goiter: Secondary | ICD-10-CM | POA: Insufficient documentation

## 2022-01-15 DIAGNOSIS — I10 Essential (primary) hypertension: Secondary | ICD-10-CM

## 2022-01-15 DIAGNOSIS — G8929 Other chronic pain: Secondary | ICD-10-CM

## 2022-01-15 DIAGNOSIS — J302 Other seasonal allergic rhinitis: Secondary | ICD-10-CM | POA: Diagnosis not present

## 2022-01-15 DIAGNOSIS — M5442 Lumbago with sciatica, left side: Secondary | ICD-10-CM | POA: Diagnosis not present

## 2022-01-15 MED ORDER — CETIRIZINE HCL 10 MG PO TABS: 10.0000 mg | ORAL_TABLET | Freq: Every day | ORAL | 11 refills | Status: DC

## 2022-01-15 MED ORDER — PANTOPRAZOLE SODIUM 40 MG PO TBEC: 40.0000 mg | DELAYED_RELEASE_TABLET | Freq: Every day | ORAL | 3 refills | Status: DC

## 2022-01-15 MED ORDER — DICLOFENAC SODIUM 75 MG PO TBEC: 75.0000 mg | DELAYED_RELEASE_TABLET | Freq: Two times a day (BID) | ORAL | 0 refills | Status: DC

## 2022-01-15 MED ORDER — FLUTICASONE PROPIONATE 50 MCG/ACT NA SUSP: 2.0000 | Freq: Every day | NASAL | 6 refills | Status: DC

## 2022-01-15 NOTE — Assessment & Plan Note (Signed)
-   Ambulatory referral to Endocrinology - CBC - Comprehensive metabolic panel - Lipid Panel - TSH  2. Essential hypertension  - CBC - Comprehensive metabolic panel - Lipid Panel - TSH  3. Seasonal allergies  - cetirizine (ZYRTEC) 10 MG tablet; Take 1 tablet (10 mg total) by mouth daily.  Dispense: 30 tablet; Refill: 11 - fluticasone (FLONASE) 50 MCG/ACT nasal spray; Place 2 sprays into both nostrils daily.  Dispense: 16 g; Refill: 6  4. 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  Dispense: 30 tablet; Refill: 0  Follow up:  Follow up in 3 months - may need ortho referral for back pain

## 2022-01-15 NOTE — Progress Notes (Unsigned)
@Patient  ID: , female    DOB: [birth year], 51 y.o.   MRN: [id number]  Chief Complaint  Patient presents with   Hypertension    Pt is here for HTN 3 month's follow up. Pt is requesting referral for biopsy for thyroid. Pt requesting refill Pantoprazole, also requesting medication for sinus pt states methocarbamol  is not working for back pain.    Referring provider: [phone], NP   HPI  Jennifer Forbes presents for follow up. She has been lost to follow up last apt >1 year. She  has a past medical history of Arthritis and Hypertension.   Thyroid Patient presents for evaluation of hypothyroidism and multiple thyroid nodules.  Patient denies denies fatigue, weight changes, heat/cold intolerance, bowel/skin changes or CVS symptoms.  On 11/07/2019 she diagnosed with community-acquired pneumonia via CT scan of her chest.  The radiologist noted that the thoracic island demonstrates that the thyroid was enlarged bilaterally with diffuse heterogeneity.  The recommendation was for further evaluation with an ultrasound.  The ultrasound indicated thyromegaly with findings suggesting a multinodular goiter and there was recommendation for further evaluation of nodules 2 and 4 with percutaneous sampling. Will refer patient to endocrinology for further evaluation.   Seasonal allergies:  Patient states that she has seasonal allergies x 3 years. Not currently taking anything for this. Would like prescription sent to pharmacy. Complains of runny nose  Hypertension:  Complaint with medications. Blood pressure stable in office today.   Low back:  Patient continues to have low back pain. This has been an issue for the past several years. She states that mobic is not effective.    Denies f/c/s, n/v/d, hemoptysis, PND, leg swelling Denies chest pain or edema    Allergies  Allergen Reactions   Amoxicillin Rash    Immunization History  Administered Date(s) Administered    Influenza,inj,Quad PF,6+ Mos 02/14/2019   Pneumococcal Polysaccharide-23 10/22/2015   Tdap 06/22/2016    Past Medical History:  Diagnosis Date   Arthritis    Left knee   Headache    Hypertension    Tachycardia    Per patient    Tobacco History: Social History   Tobacco Use  Smoking Status Every Day   Packs/day: 0.25   Types: Cigars, Cigarettes  Smokeless Tobacco Never  Tobacco Comments   black and mild smokes about 3 per day   Ready to quit: Not Answered Counseling given: Not Answered Tobacco comments: black and mild smokes about 3 per day   Outpatient Encounter Medications as of 01/15/2022  Medication Sig   amLODipine (NORVASC) 5 MG tablet Take 1 tablet (5 mg total) by mouth daily.   Aspirin-Salicylamide-Caffeine (BC HEADACHE POWDER PO) Take 1-2 packets by mouth every 4 (four) hours as needed (migraines).   cetirizine (ZYRTEC) 10 MG tablet Take 1 tablet (10 mg total) by mouth daily.   diclofenac (VOLTAREN) 75 MG EC tablet Take 1 tablet (75 mg total) by mouth 2 (two) times daily.   fluticasone (FLONASE) 50 MCG/ACT nasal spray Place 2 sprays into both nostrils daily.   metoprolol succinate (TOPROL-XL) 25 MG 24 hr tablet Take 0.5 tablets (12.5 mg total) by mouth daily.   [DISCONTINUED] pantoprazole (PROTONIX) 40 MG tablet Take 1 tablet (40 mg total) by mouth daily.   pantoprazole (PROTONIX) 40 MG tablet Take 1 tablet (40 mg total) by mouth daily.   [DISCONTINUED] methocarbamol (ROBAXIN) 500 MG tablet Take 1 tablet (500 mg total) by mouth at bedtime as needed for  muscle spasms. (Patient not taking: Reported on 01/15/2022)   [DISCONTINUED] oxyCODONE (ROXICODONE) 5 MG immediate release tablet Take 1 tablet (5 mg total) by mouth every 6 (six) hours as needed for severe pain. (Patient not taking: Reported on 01/15/2022)   No facility-administered encounter medications on file as of 01/15/2022.     Review of Systems  Review of Systems  Constitutional: Negative.   HENT:   Positive for postnasal drip.   Cardiovascular: Negative.   Gastrointestinal: Negative.   Musculoskeletal:  Positive for back pain.  Allergic/Immunologic: Negative.   Neurological: Negative.   Psychiatric/Behavioral: Negative.         Physical Exam  BP 131/79 (BP Location: Right Arm, Patient Position: Sitting, Cuff Size: Large)   Pulse 88   Temp 98.1 F (36.7 C)   Ht 5\' 8"  (1.727 m)   Wt 207 lb 6 oz (94.1 kg)   LMP 01/20/2019   SpO2 100%   BMI 31.53 kg/m   Wt Readings from Last 5 Encounters:  01/15/22 207 lb 6 oz (94.1 kg)  10/01/21 213 lb (96.6 kg)  09/23/21 213 lb 14.4 oz (97 kg)  07/14/21 219 lb (99.3 kg)  01/26/21 237 lb 0.8 oz (107.5 kg)     Physical Exam Vitals and nursing note reviewed.  Constitutional:      General: She is not in acute distress.    Appearance: She is well-developed.  Cardiovascular:     Rate and Rhythm: Normal rate and regular rhythm.  Pulmonary:     Effort: Pulmonary effort is normal.     Breath sounds: Normal breath sounds.  Neurological:     Mental Status: She is alert and oriented to person, place, and time.      Assessment & Plan:   Multiple thyroid nodules - Ambulatory referral to Endocrinology - CBC - Comprehensive metabolic panel - Lipid Panel - TSH  2. Essential hypertension  - CBC - Comprehensive metabolic panel - Lipid Panel - TSH  3. Seasonal allergies  - cetirizine (ZYRTEC) 10 MG tablet; Take 1 tablet (10 mg total) by mouth daily.  Dispense: 30 tablet; Refill: 11 - fluticasone (FLONASE) 50 MCG/ACT nasal spray; Place 2 sprays into both nostrils daily.  Dispense: 16 g; Refill: 6  4. 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  Dispense: 30 tablet; Refill: 0  Follow up:  Follow up in 3 months - may need ortho referral for back pain     01/28/21, NP 01/15/2022

## 2022-01-15 NOTE — Patient Instructions (Signed)
1. Multiple thyroid nodules  - Ambulatory referral to Endocrinology - CBC - Comprehensive metabolic panel - Lipid Panel - TSH  2. Essential hypertension  - CBC - Comprehensive metabolic panel - Lipid Panel - TSH  3. Seasonal allergies  - cetirizine (ZYRTEC) 10 MG tablet; Take 1 tablet (10 mg total) by mouth daily.  Dispense: 30 tablet; Refill: 11 - fluticasone (FLONASE) 50 MCG/ACT nasal spray; Place 2 sprays into both nostrils daily.  Dispense: 16 g; Refill: 6  4. 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  Dispense: 30 tablet; Refill: 0  Follow up:  Follow up in 3 months - may need ortho referral for back pain

## 2022-01-16 LAB — COMPREHENSIVE METABOLIC PANEL
ALT: 8 IU/L (ref 0–32)
AST: 13 IU/L (ref 0–40)
Albumin/Globulin Ratio: 1.5 (ref 1.2–2.2)
Albumin: 4.5 g/dL (ref 3.9–4.9)
Alkaline Phosphatase: 120 IU/L (ref 44–121)
BUN/Creatinine Ratio: 14 (ref 9–23)
BUN: 10 mg/dL (ref 6–24)
Bilirubin Total: <0.2 mg/dL (ref 0.0–1.2)
CO2: 22 mmol/L (ref 20–29)
Calcium: 9.7 mg/dL (ref 8.7–10.2)
Chloride: 102 mmol/L (ref 96–106)
Creatinine, Ser: 0.69 mg/dL (ref 0.57–1.00)
Globulin, Total: 3 g/dL (ref 1.5–4.5)
Glucose: 73 mg/dL (ref 70–99)
Potassium: 5.1 mmol/L (ref 3.5–5.2)
Sodium: 140 mmol/L (ref 134–144)
Total Protein: 7.5 g/dL (ref 6.0–8.5)
eGFR: 106 mL/min/{1.73_m2} (ref >59)

## 2022-01-16 LAB — CBC
Hematocrit: 44.7 % (ref 34.0–46.6)
Hemoglobin: 15.4 g/dL (ref 11.1–15.9)
MCH: 32.2 pg (ref 26.6–33.0)
MCHC: 34.5 g/dL (ref 31.5–35.7)
MCV: 93 fL (ref 79–97)
Platelets: 311 10*3/uL (ref 150–450)
RBC: 4.79 x10E6/uL (ref 3.77–5.28)
RDW: 13 % (ref 11.7–15.4)
WBC: 4.5 10*3/uL (ref 3.4–10.8)

## 2022-01-16 LAB — LIPID PANEL
Chol/HDL Ratio: 2.8 ratio (ref 0.0–4.4)
Cholesterol, Total: 184 mg/dL (ref 100–199)
HDL: 65 mg/dL (ref >39)
LDL Chol Calc (NIH): 101 mg/dL — ABNORMAL HIGH (ref 0–99)
Triglycerides: 98 mg/dL (ref 0–149)
VLDL Cholesterol Cal: 18 mg/dL (ref 5–40)

## 2022-01-16 LAB — TSH: TSH: 0.869 u[IU]/mL (ref 0.450–4.500)

## 2022-03-19 ENCOUNTER — Other Ambulatory Visit: Payer: Self-pay | Admitting: Nurse Practitioner

## 2022-03-19 DIAGNOSIS — I10 Essential (primary) hypertension: Secondary | ICD-10-CM

## 2022-03-19 DIAGNOSIS — R000 Tachycardia, unspecified: Secondary | ICD-10-CM

## 2022-03-22 ENCOUNTER — Other Ambulatory Visit: Payer: Self-pay

## 2022-03-22 DIAGNOSIS — I10 Essential (primary) hypertension: Secondary | ICD-10-CM

## 2022-03-22 DIAGNOSIS — R000 Tachycardia, unspecified: Secondary | ICD-10-CM

## 2022-03-22 MED ORDER — AMLODIPINE BESYLATE 5 MG PO TABS: 5.0000 mg | ORAL_TABLET | Freq: Every day | ORAL | 0 refills | Status: DC

## 2022-03-22 MED ORDER — METOPROLOL SUCCINATE ER 25 MG PO TB24: 12.5000 mg | ORAL_TABLET | Freq: Every day | ORAL | 0 refills | Status: DC

## 2022-04-19 ENCOUNTER — Ambulatory Visit: Payer: PRIVATE HEALTH INSURANCE | Admitting: Nurse Practitioner

## 2022-05-12 NOTE — Patient Instructions (Signed)
Thyroid Nodule  A thyroid nodule is an isolated growth of thyroid cells that forms a lump in the thyroid gland. The thyroid gland is a butterfly-shaped gland found in the lower front of the neck. It sends chemical messengers (hormones) through the blood to all parts of the body. These hormones are important in regulating body temperature and helping the body use energy. Thyroid nodules are common. Most are not cancerous (are benign). You may have one nodule or several nodules. There are different types of thyroid nodules. They include nodules that: Grow and fill with fluid (thyroid cysts). Produce too much thyroid hormone (hot nodules or hyperthyroid). Produce no thyroid hormone (cold nodules or hypothyroid). Form from cancer cells (thyroid cancers). What are the causes? In most cases, the cause of thyroid nodules is not known. What increases the risk? The following factors may make you more likely to develop thyroid nodules: Age. Thyroid nodules are more common in people who are older than 45 years. Female gender. A family history that includes: Thyroid nodules. Pheochromocytoma. Thyroid carcinoma. Hyperparathyroidism. Certain thyroid diseases, such as Hashimoto's thyroiditis. Lack of iodine in your diet. A history of head and neck radiation, such as from cancer treatments. Type 2 diabetes. What are the signs or symptoms? In many cases, there are no symptoms. If you have symptoms, they may include: A lump in your lower neck. Feeling pressure, fullness, or a tickle in your throat. Pain in your neck, jaw, or ear. Having trouble swallowing or breathing. Hot nodules may cause: Weight loss. Warm, flushed skin. Feeling hot. Feeling nervous. A rapid or irregular heartbeat. Cold nodules may cause: Weight gain. Dry skin. Hair loss, brittle hair, or both. Feeling cold. Fatigue. Thyroid cancer nodules may cause: Hard nodules that can be felt along the thyroid  gland. Hoarseness. Lumps in the tissue (lymph nodes) near your thyroid gland. How is this diagnosed? A thyroid nodule may be felt by your health care provider during a physical exam. This condition may also be diagnosed based on your symptoms. You may also have tests, including: Blood tests to check how well your thyroid is working. An ultrasound. This may be done to confirm the diagnosis. A biopsy. This involves taking a sample from the nodule and looking at it under a microscope. A thyroid scan. This test creates an image of the thyroid gland using a radioactive tracer. Imaging tests such as an MRI or CT scan. These may be done if: A nodule is large. A nodule is blocking your airway. Cancer is suspected. How is this treated? Treatment depends on the cause and size of your nodule or nodules. If a nodule is benign, treatment may not be necessary. Your health care provider may monitor the nodule to see if it goes away without treatment. If a nodule continues to grow, is cancerous, or does not go away, treatment may be needed. Treatment may include: Having a cystic nodule drained with a needle. Ablation therapy. In this treatment, alcohol is injected into the area of the nodule to destroy the cells. Ablation with heat may also be used. This is called thermal ablation. Radioactive iodine. In this treatment, radioactive iodine is given as a pill or liquid that you drink. This substance causes the thyroid nodule to shrink. Surgery to remove the nodule or nodules. Part or all of your thyroid gland may also need to be removed. Medicines to treat hyperthyroidism. Follow these instructions at home: Pay attention to any changes in your thyroid nodule or nodules. Take over-the-counter   and prescription medicines only as told by your health care provider. Keep all follow-up visits. This is important. Contact a health care provider if: You have trouble sleeping. You have muscle weakness. You have  significant weight loss without changing your eating habits. You feel nervous. You have trouble swallowing. You have increased swelling. You have a rapid or irregular heartbeat. Get help right away if: You have chest pain. You faint or lose consciousness. Your nodule makes it hard for you to breathe. These symptoms may be an emergency. Get help right away. Call 911. Do not wait to see if the symptoms will go away. Do not drive yourself to the hospital. Summary A thyroid nodule is an isolated growth of thyroid cells that forms a lump in your thyroid gland. Thyroid nodules are common. Most are not cancerous. Your health care provider may monitor the nodule to see if it goes away without treatment. If a nodule continues to grow, is cancerous, or does not go away, treatment may be needed. Treatment depends on the cause and size of your nodule or nodules. This information is not intended to replace advice given to you by your health care provider. Make sure you discuss any questions you have with your health care provider. Document Revised: 04/06/2021 Document Reviewed: 04/06/2021 [employer] Patient Education  2023 [employer]  

## 2022-05-13 ENCOUNTER — Ambulatory Visit (INDEPENDENT_AMBULATORY_CARE_PROVIDER_SITE_OTHER): Admitting: Nurse Practitioner

## 2022-05-13 ENCOUNTER — Encounter: Payer: Self-pay | Admitting: Nurse Practitioner

## 2022-05-13 VITALS — BP 117/74 | HR 79 | Ht 68.0 in | Wt 208.0 lb

## 2022-05-13 DIAGNOSIS — E042 Nontoxic multinodular goiter: Secondary | ICD-10-CM | POA: Diagnosis not present

## 2022-05-13 NOTE — Progress Notes (Signed)
Endocrinology Consult Note 05/13/22    ---------------------------------------------------------------------------------------------------------------------- Subjective    Past Medical History:  Diagnosis Date   Arthritis    Left knee   Headache    Hypertension    Tachycardia    Per patient    Past Surgical History:  Procedure Laterality Date   BREAST BIOPSY  2015   left breast   BREAST LUMPECTOMY WITH RADIOACTIVE SEED LOCALIZATION Left 10/01/2021   Procedure: LEFT BREAST LUMPECTOMY WITH RADIOACTIVE SEED LOCALIZATION;  Surgeon: Griselda Miner, MD;  Location: [facility];  Service: General;  Laterality: Left;   LIPOMA RESECTION  march 2014   right shoulder    SKIN BIOPSY      Social History   Socioeconomic History   Marital status: Legally Separated    Spouse name: Not on file   Number of children: 1   Years of education: Not on file   Highest education level: Associate degree: occupational, Scientist, product/process development, or vocational program  Occupational History   Not on file  Tobacco Use   Smoking status: Every Day    Packs/day: 0.25    Types: Cigars, Cigarettes   Smokeless tobacco: Never   Tobacco comments:    black and mild smokes about 3 per day  Vaping Use   Vaping Use: Never used  Substance and Sexual Activity   Alcohol use: No   Drug use: Not Currently   Sexual activity: Yes    Birth control/protection: None  Other Topics Concern   Not on file  Social History Narrative   Not on file   Social Determinants of Health   Financial Resource Strain: Not on file  Food Insecurity: No Food Insecurity (07/14/2021)   Hunger Vital Sign    Worried About Running Out of Food in the Last Year: Never true    Ran Out of Food in the Last Year: Never true  Transportation Needs: No Transportation Needs (07/14/2021)   PRAPARE - Administrator, Civil Service (Medical): No    Lack of Transportation (Non-Medical): No  Physical Activity: Not on file  Stress: Not on file  Social  Connections: Not on file  Intimate Partner Violence: Not on file    Current Outpatient Medications on File Prior to Visit  Medication Sig Dispense Refill   amLODipine (NORVASC) 5 MG tablet Take 1 tablet (5 mg total) by mouth daily. 90 tablet 0   Aspirin-Salicylamide-Caffeine (BC HEADACHE POWDER PO) Take 1-2 packets by mouth every 4 (four) hours as needed (migraines).     metoprolol succinate (TOPROL-XL) 25 MG 24 hr tablet Take 0.5 tablets (12.5 mg total) by mouth daily. 45 tablet 0   cetirizine (ZYRTEC) 10 MG tablet Take 1 tablet (10 mg total) by mouth daily. (Patient not taking: Reported on 05/13/2022) 30 tablet 11   diclofenac (VOLTAREN) 75 MG EC tablet Take 1 tablet (75 mg total) by mouth 2 (two) times daily. (Patient not taking: Reported on 05/13/2022) 30 tablet 0   fluticasone (FLONASE) 50 MCG/ACT nasal spray Place 2 sprays into both nostrils daily. (Patient not taking: Reported on 05/13/2022) 16 g 6   pantoprazole (PROTONIX) 40 MG tablet Take 1 tablet (40 mg total) by mouth daily. (Patient not taking: Reported on 05/13/2022) 90 tablet 3   No current facility-administered medications on file prior to visit.      HPI   Jennifer Forbes is a 51 y.o.-year-old female, referred by her PCP, Dr.Nichols, for evaluation for multinodular goiter.  Thyroid U/S: 11/28/19 CLINICAL DATA:  Incidental  on CT.  Thyromegaly on chest CT.   EXAM: THYROID ULTRASOUND   TECHNIQUE: Ultrasound examination of the thyroid gland and adjacent soft tissues was performed.   COMPARISON:  Chest CT-11/08/2019   FINDINGS: Parenchymal Echotexture: Mildly heterogenous   Isthmus: Normal in size measuring 0.5 cm in diameter   Right lobe: Enlarged measuring 6.9 x 2.6 x 2.1 cm   Left lobe: Enlarged measuring 7.2 x 2.3 x 2.3 cm   _________________________________________________________   Estimated total number of nodules >/= 1 cm: 6-10   Number of spongiform nodules >/=  2 cm not described below (TR1): 0   Number  of mixed cystic and solid nodules >/= 1.5 cm not described below (TR2): 0   _________________________________________________________   Nodule # 1:   Location: Right; Mid   Maximum size: 2.0 cm; Other 2 dimensions: 1.8 x 1.2 cm   Composition: solid/almost completely solid (2)   Echogenicity: isoechoic (1)   Shape: not taller-than-wide (0)   Margins: ill-defined (0)   Echogenic foci: none (0)   ACR TI-RADS total points: 3.   ACR TI-RADS risk category: TR3 (3 points).   ACR TI-RADS recommendations:   *Given size (>/= 1.5 - 2.4 cm) and appearance, a follow-up ultrasound in 1 year should be considered based on TI-RADS criteria.   _________________________________________________________   Nodule # 2:   Location: Right; Mid   Maximum size: 2.7 cm; Other 2 dimensions: 2.5 x 1.6 cm   Composition: solid/almost completely solid (2)   Echogenicity: isoechoic (1)   Shape: not taller-than-wide (0)   Margins: ill-defined (0)   Echogenic foci: none (0)   ACR TI-RADS total points: 3.   ACR TI-RADS risk category: TR3 (3 points).   ACR TI-RADS recommendations:   **Given size (>/= 2.5 cm) and appearance, fine needle aspiration of this mildly suspicious nodule should be considered based on TI-RADS criteria.   _________________________________________________________   Nodule # 3:   Location: Right; Mid   Maximum size: 1.7 cm; Other 2 dimensions: 1.5 x 0.9 cm   Composition: solid/almost completely solid (2)   Echogenicity: isoechoic (1)   Shape: not taller-than-wide (0)   Margins: ill-defined (0)   Echogenic foci: none (0)   ACR TI-RADS total points: 3.   ACR TI-RADS risk category: TR3 (3 points).   ACR TI-RADS recommendations:   *Given size (>/= 1.5 - 2.4 cm) and appearance, a follow-up ultrasound in 1 year should be considered based on TI-RADS criteria.   _________________________________________________________    _________________________________________________________   Nodule # 4:   Location: Left; Inferior   Maximum size: 3.7 cm; Other 2 dimensions: 3.1 x 2.3 cm   Composition: solid/almost completely solid (2)   Echogenicity: isoechoic (1)   Shape: not taller-than-wide (0)   Margins: ill-defined (0)   Echogenic foci: none (0)   ACR TI-RADS total points: 3.   ACR TI-RADS risk category: TR3 (3 points).   ACR TI-RADS recommendations:   **Given size (>/= 2.5 cm) and appearance, fine needle aspiration of this mildly suspicious nodule should be considered based on TI-RADS criteria.   _________________________________________________________   There is an approximately 1.4 x 1.2 x 1.0 cm isoechoic ill-defined nodule/pseudonodule involving the mid, posterior aspect the right lobe of the thyroid (labeled 5), which does not meet criteria to recommend percutaneous sampling or continued dedicated follow-up   _________________________________________________________   Nodule # 6:   Location: Left; Superior   Maximum size: 2.1 cm; Other 2 dimensions: 1.8 x 1.1 cm   Composition: solid/almost  completely solid (2)   Echogenicity: isoechoic (1)   Shape: not taller-than-wide (0)   Margins: ill-defined (0)   Echogenic foci: none (0)   ACR TI-RADS total points: 3.   ACR TI-RADS risk category: TR3 (3 points).   ACR TI-RADS recommendations:   *Given size (>/= 1.5 - 2.4 cm) and appearance, a follow-up ultrasound in 1 year should be considered based on TI-RADS criteria.   _________________________________________________________   Ronnald Nian approximately 2.0 x 1.9 x 0.9 cm isoechoic nodule within the inferior pole the left lobe of the thyroid (labeled 7) is favored to represent a pseudonodule as it lacks defined borders on both provided transverse and sagittal images.   IMPRESSION: 1. Thyromegaly with findings suggestive of multinodular goiter. 2. Nodules #2 and #4 both  meet imaging criteria to recommend percutaneous sampling as indicated. 3. Nodules #1, #3 and #6 all meet imaging criteria to recommend a 1 year follow-up as indicated.   The above is in keeping with the ACR TI-RADS recommendations - J Am Coll Radiol 2017;14:587-595.     Electronically Signed   By: Simonne Come M.D.   On: 11/28/2019 12:42   I reviewed pt's thyroid tests: Lab Results  Component Value Date   TSH 0.869 01/15/2022   TSH 1.660 10/03/2019     Pt c/o: - fatigue - heat intolerance/cold intolerance - tremors - palpitations - weight loss  Pt denies - feeling nodules in neck - hoarseness - dysphagia - choking - SOB with lying down  No FH of thyroid ds. No FH of thyroid cancer. No h/o radiation tx to head or neck.  Mom did have parathyroid gland removed.  No seaweed or kelp. No recent contrast studies. No steroid use. No herbal supplements. No Biotin supplements or Hair, Skin and Nails vitamins.   Review of systems  Constitutional: + steadily decreasing body weight-per patient,  current Body mass index is 31.63 kg/m. , + fatigue, fluctuating heat/cold intolerance Eyes: no blurry vision, no xerophthalmia ENT: no sore throat, no nodules palpated in throat, no dysphagia/odynophagia, no hoarseness Cardiovascular: no chest pain, no shortness of breath, + palpitations, no leg swelling Respiratory: no cough, no shortness of breath Gastrointestinal: no nausea/vomiting/diarrhea Musculoskeletal: no muscle/joint aches Skin: no rashes, no hyperemia Neurological: no tremors, no numbness, no tingling, no dizziness Psychiatric: no depression, no anxiety  ---------------------------------------------------------------------------------------------------------------------- Objective    BP 117/74 (BP Location: Left Arm, Patient Position: Sitting, Cuff Size: Large)   Pulse 79   Ht 5\' 8"  (1.727 m)   Wt 208 lb (94.3 kg)   LMP 01/20/2019   BMI 31.63 kg/m    BP Readings  from Last 3 Encounters:  05/13/22 117/74  01/15/22 131/79  10/01/21 (!) 155/76    Wt Readings from Last 3 Encounters:  05/13/22 208 lb (94.3 kg)  01/15/22 207 lb 6 oz (94.1 kg)  10/01/21 213 lb (96.6 kg)     Physical Exam- Limited  Constitutional:  Body mass index is 31.63 kg/m. , not in acute distress, normal state of mind Eyes:  EOMI, no exophthalmos Neck: Supple Thyroid: + gross goiter Cardiovascular: RRR, no murmurs, rubs, or gallops, no edema Respiratory: Adequate breathing efforts, no crackles, rales, rhonchi, or wheezing Musculoskeletal: no gross deformities, strength intact in all four extremities, no gross restriction of joint movements Skin:  no rashes, no hyperemia Neurological: no tremor with outstretched hands      ----------------------------------------------------------------------------------------------------------------------  ASSESSMENT / PLAN:  1. Thyroid Nodule  - I reviewed the images of her thyroid ultrasound  along with the patient. I pointed out that the dominant nodules are large, this being a risk factor for cancer.  Otherwise, the nodules are: - not hypoechoic - without microcalcifications - without internal blood flow - more wide than tall - well delimited from surrounding tissue  Pt does not have a thyroid cancer family history or a personal history of RxTx to head/neck. All these would favor benignity.  - the only way that we can tell exactly if it is cancer or not is by doing a thyroid biopsy (FNA). I explained what the test entails.  However, given the length of time that has passed since her thyroid ultrasound in 2021, will order ultrasound to reassess prior to ordering the FNA.  Will also check comprehensive thyroid labs to assess for functional problem with the thyroid and assess for autoimmune thyroid dysfunction.  FOLLOW UP PLAN: Return in about 4 weeks (around 06/10/2022) for Thyroid follow up, Previsit labs, thyroid ultrasound.     I spent 45 minutes in the care of the patient today including review of labs from Thyroid Function, CMP, and other relevant labs ; imaging/biopsy records (current and previous including abstractions from other facilities); face-to-face time discussing  her lab results and symptoms, medications doses, her options of short and long term treatment based on the latest standards of care / guidelines;   and documenting the encounter.  [name]  participated in the discussions, expressed understanding, and voiced agreement with the above plans.  All questions were answered to her satisfaction. she is encouraged to contact clinic should she have any questions or concerns prior to her return visit.    Ronny Bacon, [facility] [address] [place], [place] [address] Phone: [phone] Fax: [phone]

## 2022-05-17 DIAGNOSIS — R32 Unspecified urinary incontinence: Secondary | ICD-10-CM | POA: Diagnosis not present

## 2022-05-27 ENCOUNTER — Ambulatory Visit (HOSPITAL_COMMUNITY): Admission: RE | Admit: 2022-05-27 | Payer: Medicaid Other | Source: Ambulatory Visit

## 2022-06-03 ENCOUNTER — Ambulatory Visit (HOSPITAL_COMMUNITY)
Admission: RE | Admit: 2022-06-03 | Discharge: 2022-06-03 | Disposition: A | Discharge status: Home / Self Care | Source: Ambulatory Visit | Attending: Nurse Practitioner | Admitting: Nurse Practitioner

## 2022-06-03 ENCOUNTER — Other Ambulatory Visit (HOSPITAL_COMMUNITY)
Admission: RE | Admit: 2022-06-03 | Discharge: 2022-06-03 | Disposition: A | Discharge status: Home / Self Care | Source: Ambulatory Visit | Attending: Nurse Practitioner | Admitting: Nurse Practitioner

## 2022-06-03 DIAGNOSIS — E042 Nontoxic multinodular goiter: Secondary | ICD-10-CM | POA: Diagnosis not present

## 2022-06-03 DIAGNOSIS — E049 Nontoxic goiter, unspecified: Secondary | ICD-10-CM | POA: Insufficient documentation

## 2022-06-03 LAB — TSH: TSH: 1.092 u[IU]/mL (ref 0.350–4.500)

## 2022-06-04 LAB — T3, FREE: T3, Free: 2.4 pg/mL (ref 2.0–4.4)

## 2022-06-04 LAB — THYROID PEROXIDASE ANTIBODY: Thyroperoxidase Ab SerPl-aCnc: 11 IU/mL (ref 0–34)

## 2022-06-04 LAB — T4, FREE: Free T4: 0.7 ng/dL (ref 0.61–1.12)

## 2022-06-11 ENCOUNTER — Ambulatory Visit (INDEPENDENT_AMBULATORY_CARE_PROVIDER_SITE_OTHER): Admitting: Nurse Practitioner

## 2022-06-11 ENCOUNTER — Encounter: Payer: Self-pay | Admitting: Nurse Practitioner

## 2022-06-11 VITALS — BP 132/85 | HR 87 | Ht 68.0 in | Wt 206.8 lb

## 2022-06-11 DIAGNOSIS — E042 Nontoxic multinodular goiter: Secondary | ICD-10-CM | POA: Diagnosis not present

## 2022-06-11 NOTE — Progress Notes (Signed)
Endocrinology Follow Up Note 06/11/22    ---------------------------------------------------------------------------------------------------------------------- Subjective    Past Medical History:  Diagnosis Date   Arthritis    Left knee   Headache    Hypertension    Tachycardia    Per patient    Past Surgical History:  Procedure Laterality Date   BREAST BIOPSY  2015   left breast   BREAST LUMPECTOMY WITH RADIOACTIVE SEED LOCALIZATION Left 10/01/2021   Procedure: LEFT BREAST LUMPECTOMY WITH RADIOACTIVE SEED LOCALIZATION;  Surgeon: Jovita Kussmaul, MD;  Location: [facility];  Service: General;  Laterality: Left;   LIPOMA RESECTION  march 2014   right shoulder    SKIN BIOPSY      Social History   Socioeconomic History   Marital status: Legally Separated    Spouse name: Not on file   Number of children: 1   Years of education: Not on file   Highest education level: Associate degree: occupational, Hotel manager, or vocational program  Occupational History   Not on file  Tobacco Use   Smoking status: Every Day    Packs/day: 0.25    Types: Cigars, Cigarettes   Smokeless tobacco: Never   Tobacco comments:    black and mild smokes about 3 per day  Vaping Use   Vaping Use: Never used  Substance and Sexual Activity   Alcohol use: No   Drug use: Not Currently   Sexual activity: Yes    Birth control/protection: None  Other Topics Concern   Not on file  Social History Narrative   Not on file   Social Determinants of Health   Financial Resource Strain: Not on file  Food Insecurity: No Food Insecurity (07/14/2021)   Hunger Vital Sign    Worried About Running Out of Food in the Last Year: Never true    Ran Out of Food in the Last Year: Never true  Transportation Needs: No Transportation Needs (07/14/2021)   PRAPARE - Hydrologist (Medical): No    Lack of Transportation (Non-Medical): No  Physical Activity: Not on file  Stress: Not on file  Social  Connections: Not on file  Intimate Partner Violence: Not on file    Current Outpatient Medications on File Prior to Visit  Medication Sig Dispense Refill   amLODipine (NORVASC) 5 MG tablet Take 1 tablet (5 mg total) by mouth daily. 90 tablet 0   Aspirin-Salicylamide-Caffeine (BC HEADACHE POWDER PO) Take 1-2 packets by mouth every 4 (four) hours as needed (migraines).     metoprolol succinate (TOPROL-XL) 25 MG 24 hr tablet Take 0.5 tablets (12.5 mg total) by mouth daily. 45 tablet 0   cetirizine (ZYRTEC) 10 MG tablet Take 1 tablet (10 mg total) by mouth daily. (Patient not taking: Reported on 05/13/2022) 30 tablet 11   diclofenac (VOLTAREN) 75 MG EC tablet Take 1 tablet (75 mg total) by mouth 2 (two) times daily. (Patient not taking: Reported on 05/13/2022) 30 tablet 0   fluticasone (FLONASE) 50 MCG/ACT nasal spray Place 2 sprays into both nostrils daily. (Patient not taking: Reported on 05/13/2022) 16 g 6   pantoprazole (PROTONIX) 40 MG tablet Take 1 tablet (40 mg total) by mouth daily. (Patient not taking: Reported on 05/13/2022) 90 tablet 3   No current facility-administered medications on file prior to visit.      HPI   Jennifer Forbes is a 52 y.o.-year-old female, referred by her PCP, Dr.Nichols, for evaluation for multinodular goiter.  Thyroid U/S: 11/28/19 CLINICAL DATA:  Incidental on CT.  Thyromegaly on chest CT.   EXAM: THYROID ULTRASOUND   TECHNIQUE: Ultrasound examination of the thyroid gland and adjacent soft tissues was performed.   COMPARISON:  Chest CT-11/08/2019   FINDINGS: Parenchymal Echotexture: Mildly heterogenous   Isthmus: Normal in size measuring 0.5 cm in diameter   Right lobe: Enlarged measuring 6.9 x 2.6 x 2.1 cm   Left lobe: Enlarged measuring 7.2 x 2.3 x 2.3 cm   _________________________________________________________   Estimated total number of nodules >/= 1 cm: 6-10   Number of spongiform nodules >/=  2 cm not described below (TR1): 0   Number  of mixed cystic and solid nodules >/= 1.5 cm not described below (TR2): 0   _________________________________________________________   Nodule # 1:   Location: Right; Mid   Maximum size: 2.0 cm; Other 2 dimensions: 1.8 x 1.2 cm   Composition: solid/almost completely solid (2)   Echogenicity: isoechoic (1)   Shape: not taller-than-wide (0)   Margins: ill-defined (0)   Echogenic foci: none (0)   ACR TI-RADS total points: 3.   ACR TI-RADS risk category: TR3 (3 points).   ACR TI-RADS recommendations:   *Given size (>/= 1.5 - 2.4 cm) and appearance, a follow-up ultrasound in 1 year should be considered based on TI-RADS criteria.   _________________________________________________________   Nodule # 2:   Location: Right; Mid   Maximum size: 2.7 cm; Other 2 dimensions: 2.5 x 1.6 cm   Composition: solid/almost completely solid (2)   Echogenicity: isoechoic (1)   Shape: not taller-than-wide (0)   Margins: ill-defined (0)   Echogenic foci: none (0)   ACR TI-RADS total points: 3.   ACR TI-RADS risk category: TR3 (3 points).   ACR TI-RADS recommendations:   **Given size (>/= 2.5 cm) and appearance, fine needle aspiration of this mildly suspicious nodule should be considered based on TI-RADS criteria.   _________________________________________________________   Nodule # 3:   Location: Right; Mid   Maximum size: 1.7 cm; Other 2 dimensions: 1.5 x 0.9 cm   Composition: solid/almost completely solid (2)   Echogenicity: isoechoic (1)   Shape: not taller-than-wide (0)   Margins: ill-defined (0)   Echogenic foci: none (0)   ACR TI-RADS total points: 3.   ACR TI-RADS risk category: TR3 (3 points).   ACR TI-RADS recommendations:   *Given size (>/= 1.5 - 2.4 cm) and appearance, a follow-up ultrasound in 1 year should be considered based on TI-RADS criteria.   _________________________________________________________    _________________________________________________________   Nodule # 4:   Location: Left; Inferior   Maximum size: 3.7 cm; Other 2 dimensions: 3.1 x 2.3 cm   Composition: solid/almost completely solid (2)   Echogenicity: isoechoic (1)   Shape: not taller-than-wide (0)   Margins: ill-defined (0)   Echogenic foci: none (0)   ACR TI-RADS total points: 3.   ACR TI-RADS risk category: TR3 (3 points).   ACR TI-RADS recommendations:   **Given size (>/= 2.5 cm) and appearance, fine needle aspiration of this mildly suspicious nodule should be considered based on TI-RADS criteria.   _________________________________________________________   There is an approximately 1.4 x 1.2 x 1.0 cm isoechoic ill-defined nodule/pseudonodule involving the mid, posterior aspect the right lobe of the thyroid (labeled 5), which does not meet criteria to recommend percutaneous sampling or continued dedicated follow-up   _________________________________________________________   Nodule # 6:   Location: Left; Superior   Maximum size: 2.1 cm; Other 2 dimensions: 1.8 x 1.1 cm   Composition:  solid/almost completely solid (2)   Echogenicity: isoechoic (1)   Shape: not taller-than-wide (0)   Margins: ill-defined (0)   Echogenic foci: none (0)   ACR TI-RADS total points: 3.   ACR TI-RADS risk category: TR3 (3 points).   ACR TI-RADS recommendations:   *Given size (>/= 1.5 - 2.4 cm) and appearance, a follow-up ultrasound in 1 year should be considered based on TI-RADS criteria.   _________________________________________________________   Loraine Maple approximately 2.0 x 1.9 x 0.9 cm isoechoic nodule within the inferior pole the left lobe of the thyroid (labeled 7) is favored to represent a pseudonodule as it lacks defined borders on both provided transverse and sagittal images.   IMPRESSION: 1. Thyromegaly with findings suggestive of multinodular goiter. 2. Nodules #2 and #4 both  meet imaging criteria to recommend percutaneous sampling as indicated. 3. Nodules #1, #3 and #6 all meet imaging criteria to recommend a 1 year follow-up as indicated.   The above is in keeping with the ACR TI-RADS recommendations - J Am Coll Radiol 2017;14:587-595.     Electronically Signed   By: Sandi Mariscal M.D.   On: 11/28/2019 12:42   I reviewed pt's thyroid tests: Lab Results  Component Value Date   TSH 1.092 06/03/2022   TSH 0.869 01/15/2022   TSH 1.660 10/03/2019   FREET4 0.70 06/03/2022     Pt c/o: - fatigue - heat intolerance/cold intolerance - tremors - palpitations - weight loss  Pt denies - feeling nodules in neck - hoarseness - dysphagia - choking - SOB with lying down  No FH of thyroid ds. No FH of thyroid cancer. No h/o radiation tx to head or neck.  Mom did have parathyroid gland removed.  No seaweed or kelp. No recent contrast studies. No steroid use. No herbal supplements. No Biotin supplements or Hair, Skin and Nails vitamins.   Review of systems  Constitutional: + steadily decreasing body weight-per patient,  current Body mass index is 31.44 kg/m. , + fatigue, fluctuating heat/cold intolerance Eyes: no blurry vision, no xerophthalmia ENT: no sore throat, no nodules palpated in throat, no dysphagia/odynophagia, no hoarseness Cardiovascular: no chest pain, no shortness of breath, + palpitations, no leg swelling Respiratory: no cough, no shortness of breath Gastrointestinal: no nausea/vomiting/diarrhea Musculoskeletal: no muscle/joint aches Skin: no rashes, no hyperemia Neurological: no tremors, no numbness, no tingling, no dizziness Psychiatric: no depression, no anxiety  ---------------------------------------------------------------------------------------------------------------------- Objective    BP 132/85 (BP Location: Left Arm, Patient Position: Sitting, Cuff Size: Large)   Pulse 87   Ht 5\' 8"  (1.727 m)   Wt 206 lb 12.8 oz (93.8  kg)   LMP 01/20/2019   BMI 31.44 kg/m    BP Readings from Last 3 Encounters:  06/11/22 132/85  05/13/22 117/74  01/15/22 131/79    Wt Readings from Last 3 Encounters:  06/11/22 206 lb 12.8 oz (93.8 kg)  05/13/22 208 lb (94.3 kg)  01/15/22 207 lb 6 oz (94.1 kg)     Physical Exam- Limited  Constitutional:  Body mass index is 31.44 kg/m. , not in acute distress, normal state of mind Eyes:  EOMI, no exophthalmos Musculoskeletal: no gross deformities, strength intact in all four extremities, no gross restriction of joint movements Skin:  no rashes, no hyperemia Neurological: no tremor with outstretched hands   Thyroid US from 06/03/22  CLINICAL DATA:  Goiter.   EXAM: THYROID ULTRASOUND   TECHNIQUE: Ultrasound examination of the thyroid gland and adjacent soft tissues was performed.   COMPARISON:  Prior thyroid ultrasound 11/28/2019   FINDINGS: Parenchymal Echotexture: Moderately heterogenous   Isthmus: 0.6 cm   Right lobe: 6.4 x 2.2 x 3.1 cm   Left lobe: 8.2 x 3.2 x 2.9 cm   _________________________________________________________   Estimated total number of nodules >/= 1 cm: 5   Number of spongiform nodules >/=  2 cm not described below (TR1): 0   Number of mixed cystic and solid nodules >/= 1.5 cm not described below (TR2): 0   _________________________________________________________   Nodule # 1:   Prior biopsy: No   Location: Right; Mid   Maximum size: 3.4 cm; Other 2 dimensions: 2.1 x 2.5 cm, previously, 2.5 x 1.6 x 2.7 cm   Composition: solid/almost completely solid (2)   Echogenicity: isoechoic (1)   Shape: not taller-than-wide (0)   Margins: ill-defined (0)   Echogenic foci: none (0)   ACR TI-RADS total points: 3.   ACR TI-RADS risk category:  TR3 (3 points).   Significant change in size (>/= 20% in two dimensions and minimal increase of 2 mm): Yes   Change in features: No   Change in ACR TI-RADS risk category: No   ACR  TI-RADS recommendations:   **Given size (>/= 2.5 cm) and appearance, fine needle aspiration of this mildly suspicious nodule should be considered based on TI-RADS criteria.   _________________________________________________________   Nodule # 2:   Prior biopsy: No   Location: Right; Superior   Maximum size: 2.1 cm; Other 2 dimensions: 1.3 x 1.2 cm, previously, 1.6 x 0.9 x 1.5 cm   Composition: solid/almost completely solid (2)   Echogenicity: isoechoic (1)   Shape: not taller-than-wide (0)   Margins: ill-defined (0)   Echogenic foci: none (0)   ACR TI-RADS total points: 3.   ACR TI-RADS risk category:  TR3 (3 points).   Significant change in size (>/= 20% in two dimensions and minimal increase of 2 mm): Yes   Change in features: No   Change in ACR TI-RADS risk category: No   ACR TI-RADS recommendations:   *Given size (>/= 1.5 - 2.4 cm) and appearance, a follow-up ultrasound in 1 year should be considered based on TI-RADS criteria.   _________________________________________________________   Nodule # 1 LEFT:   Prior biopsy: No   Location: Left; Mid   Maximum size: 3.3 cm; Other 2 dimensions: 1.7 x 2.5 cm, previously, 3.6 x 2.3 x 3.1 cm   Composition: solid/almost completely solid (2)   Echogenicity: isoechoic (1)   Shape: not taller-than-wide (0)   Margins: ill-defined (0)   Echogenic foci: none (0)   ACR TI-RADS total points: 3.   ACR TI-RADS risk category:  TR3 (3 points).   Significant change in size (>/= 20% in two dimensions and minimal increase of 2 mm): Yes   Change in features: No   Change in ACR TI-RADS risk category: No   ACR TI-RADS recommendations:   **Given size (>/= 2.5 cm) and appearance, fine needle aspiration of this mildly suspicious nodule should be considered based on TI-RADS criteria.   _________________________________________________________   Nodule # 2 LEFT:   Prior biopsy: No   Location: Left; Superior    Maximum size: 2.4 cm; Other 2 dimensions: 1.3 x 2.4 cm, previously, 2.1 x 1.4 x 1.7 cm   Composition: solid/almost completely solid (2)   Echogenicity: isoechoic (1)   Shape: not taller-than-wide (0)   Margins: ill-defined (0)   Echogenic foci: none (0)   ACR TI-RADS total points: 3.  ACR TI-RADS risk category:  TR3 (3 points).   Significant change in size (>/= 20% in two dimensions and minimal increase of 2 mm): Yes   Change in features: No   Change in ACR TI-RADS risk category: No   ACR TI-RADS recommendations:   *Given size (>/= 1.5 - 2.4 cm) and appearance, a follow-up ultrasound in 1 year should be considered based on TI-RADS criteria.   _________________________________________________________   Nodule # 3 LEFT:   Prior biopsy: No   Location: Left; Inferior   Maximum size: 2.1 cm; Other 2 dimensions: 2.1 x 1.7 cm, previously, 2.0 x 1.9 x 0.9 cm   Composition: solid/almost completely solid (2)   Echogenicity: isoechoic (1)   Shape: not taller-than-wide (0)   Margins: ill-defined (0)   Echogenic foci: none (0)   ACR TI-RADS total points: 3.   ACR TI-RADS risk category:  TR3 (3 points).   Significant change in size (>/= 20% in two dimensions and minimal increase of 2 mm): No   Change in features: No   Change in ACR TI-RADS risk category: No   ACR TI-RADS recommendations:   *Given size (>/= 1.5 - 2.4 cm) and appearance, a follow-up ultrasound in 1 year should be considered based on TI-RADS criteria.   _________________________________________________________   IMPRESSION: 1. Enlarging TI-RADS category 3 nodule in the right mid gland (labeled #1 above) continues to meet criteria to consider fine-needle aspiration biopsy. 2. TI-RADS category 3 nodule in the left mid gland (labeled # 1 LEFT) is slightly smaller in size but continues to meet criteria to consider fine-needle aspiration biopsy. 3. Additional nodules as above demonstrates slow  interval growth and continue to meet criteria for imaging surveillance. Recommend follow-up ultrasound in 1-2 years.   The above is in keeping with the ACR TI-RADS recommendations - J Am Coll Radiol 2017;14:587-595.     Electronically Signed   By: Jacqulynn Cadet M.D.   On: 06/05/2022 10:27     Latest Reference Range & Units 10/03/19 10:01 01/15/22 10:08 06/03/22 14:19  TSH 0.350 - 4.500 uIU/mL 1.660 0.869 1.092  Triiodothyronine,Free,Serum 2.0 - 4.4 pg/mL   2.4  T4,Free(Direct) 0.61 - 1.12 ng/dL   0.70  Thyroperoxidase Ab SerPl-aCnc 0 - 34 IU/mL   11    ----------------------------------------------------------------------------------------------------------------------  ASSESSMENT / PLAN:  1. Thyroid Nodule  - I reviewed the images of her thyroid ultrasound along with the patient. I pointed out that the dominant nodules are large, this being a risk factor for cancer.  Otherwise, the nodules are: - not hypoechoic - without microcalcifications - without internal blood flow - more wide than tall - well delimited from surrounding tissue  Pt does not have a thyroid cancer family history or a personal history of RxTx to head/neck. All these would favor benignity.  - the only way that we can tell exactly if it is cancer or not is by doing a thyroid biopsy (FNA). I explained what the test entails.  We discussed this in detail today and decided to proceed.  She does have significant neck compression symptoms, so we may be sending her for total thyroidectomy eval in the future, but need to rule out malignancy first which requires different follow up measures.  Her thyroid function tests are normal, indicating her thyroid is functioning properly despite having several nodules.  Her antibody testing was negative, ruling out genetic predisposition to developing thyroid diseases (autoimmune thyroid disease).  FOLLOW UP PLAN:  Return in about 1 month (around 07/12/2022)  for Thyroid  follow up, FNA.     I spent 22 minutes in the care of the patient today including review of labs from Thyroid Function, CMP, and other relevant labs ; imaging/biopsy records (current and previous including abstractions from other facilities); face-to-face time discussing  her lab results and symptoms, medications doses, her options of short and long term treatment based on the latest standards of care / guidelines;   and documenting the encounter.  Hassell Halim  participated in the discussions, expressed understanding, and voiced agreement with the above plans.  All questions were answered to her satisfaction. she is encouraged to contact clinic should she have any questions or concerns prior to her return visit.    Rayetta Pigg, [facility] [address] [place], [place] [address] Phone: [phone] Fax: [phone]

## 2022-06-13 LAB — THYROGLOBULIN LEVEL: Thyroglobulin: 160 ng/mL — ABNORMAL HIGH

## 2022-06-14 ENCOUNTER — Encounter: Payer: Self-pay | Admitting: Nurse Practitioner

## 2022-06-18 DIAGNOSIS — R32 Unspecified urinary incontinence: Secondary | ICD-10-CM | POA: Diagnosis not present

## 2022-06-21 ENCOUNTER — Other Ambulatory Visit: Payer: Self-pay | Admitting: Nurse Practitioner

## 2022-06-21 DIAGNOSIS — R000 Tachycardia, unspecified: Secondary | ICD-10-CM

## 2022-06-21 DIAGNOSIS — I10 Essential (primary) hypertension: Secondary | ICD-10-CM

## 2022-06-23 ENCOUNTER — Other Ambulatory Visit: Payer: Self-pay

## 2022-06-23 DIAGNOSIS — Z0100 Encounter for examination of eyes and vision without abnormal findings: Secondary | ICD-10-CM

## 2022-06-23 DIAGNOSIS — R000 Tachycardia, unspecified: Secondary | ICD-10-CM

## 2022-06-23 DIAGNOSIS — I10 Essential (primary) hypertension: Secondary | ICD-10-CM

## 2022-06-23 MED ORDER — METOPROLOL SUCCINATE ER 25 MG PO TB24: 12.5000 mg | ORAL_TABLET | Freq: Every day | ORAL | 0 refills | Status: DC

## 2022-06-23 MED ORDER — AMLODIPINE BESYLATE 5 MG PO TABS: 5.0000 mg | ORAL_TABLET | Freq: Every day | ORAL | 0 refills | Status: DC

## 2022-06-23 NOTE — Telephone Encounter (Signed)
From: Hassell Halim To: Office of Fenton Foy, NP Sent: 06/23/2022 12:12 PM EST Subject: Medication Renewal Request  Refills have been requested for the following medications:   amLODipine (NORVASC) 5 MG tablet [Tonya S Nichols]   metoprolol succinate (TOPROL-XL) 25 MG 24 hr tablet Kenney Houseman S Nichols]  Preferred pharmacy: [facility] [place] ([place]), [place] - [address] Delivery method: [employer]

## 2022-06-30 ENCOUNTER — Other Ambulatory Visit (HOSPITAL_COMMUNITY): Payer: Self-pay | Admitting: Nurse Practitioner

## 2022-06-30 ENCOUNTER — Ambulatory Visit (HOSPITAL_COMMUNITY)
Admission: RE | Admit: 2022-06-30 | Discharge: 2022-06-30 | Disposition: A | Discharge status: Home / Self Care | Source: Ambulatory Visit | Attending: Nurse Practitioner | Admitting: Nurse Practitioner

## 2022-06-30 DIAGNOSIS — E042 Nontoxic multinodular goiter: Secondary | ICD-10-CM | POA: Diagnosis not present

## 2022-06-30 MED ORDER — LIDOCAINE HCL (PF) 1 % IJ SOLN: 8.0000 mL | Freq: Once | INTRAMUSCULAR | Status: DC

## 2022-06-30 MED ORDER — LIDOCAINE HCL (PF) 1 % IJ SOLN
INTRAMUSCULAR | Status: AC
  Filled 2022-06-30: qty 30

## 2022-07-02 LAB — CYTOLOGY - NON PAP

## 2022-07-14 ENCOUNTER — Ambulatory Visit (INDEPENDENT_AMBULATORY_CARE_PROVIDER_SITE_OTHER): Admitting: Nurse Practitioner

## 2022-07-14 ENCOUNTER — Encounter: Payer: Self-pay | Admitting: Nurse Practitioner

## 2022-07-14 VITALS — BP 112/75 | HR 74 | Ht 68.0 in | Wt 206.4 lb

## 2022-07-14 DIAGNOSIS — E042 Nontoxic multinodular goiter: Secondary | ICD-10-CM | POA: Diagnosis not present

## 2022-07-14 DIAGNOSIS — R768 Other specified abnormal immunological findings in serum: Secondary | ICD-10-CM | POA: Diagnosis not present

## 2022-07-14 NOTE — Progress Notes (Signed)
Endocrinology Follow Up Note 07/14/22    ---------------------------------------------------------------------------------------------------------------------- Subjective    Past Medical History:  Diagnosis Date   Arthritis    Left knee   Headache    Hypertension    Tachycardia    Per patient    Past Surgical History:  Procedure Laterality Date   BREAST BIOPSY  2015   left breast   BREAST LUMPECTOMY WITH RADIOACTIVE SEED LOCALIZATION Left 10/01/2021   Procedure: LEFT BREAST LUMPECTOMY WITH RADIOACTIVE SEED LOCALIZATION;  Surgeon: Jovita Kussmaul, MD;  Location: [facility];  Service: General;  Laterality: Left;   LIPOMA RESECTION  march 2014   right shoulder    SKIN BIOPSY      Social History   Socioeconomic History   Marital status: Legally Separated    Spouse name: Not on file   Number of children: 1   Years of education: Not on file   Highest education level: Associate degree: occupational, Hotel manager, or vocational program  Occupational History   Not on file  Tobacco Use   Smoking status: Every Day    Packs/day: 0.25    Types: Cigars, Cigarettes   Smokeless tobacco: Never   Tobacco comments:    black and mild smokes about 3 per day  Vaping Use   Vaping Use: Never used  Substance and Sexual Activity   Alcohol use: No   Drug use: Not Currently   Sexual activity: Yes    Birth control/protection: None  Other Topics Concern   Not on file  Social History Narrative   Not on file   Social Determinants of Health   Financial Resource Strain: Not on file  Food Insecurity: No Food Insecurity (07/14/2021)   Hunger Vital Sign    Worried About Running Out of Food in the Last Year: Never true    Ran Out of Food in the Last Year: Never true  Transportation Needs: No Transportation Needs (07/14/2021)   PRAPARE - Hydrologist (Medical): No    Lack of Transportation (Non-Medical): No  Physical Activity: Not on file  Stress: Not on file  Social  Connections: Not on file  Intimate Partner Violence: Not on file    Current Outpatient Medications on File Prior to Visit  Medication Sig Dispense Refill   amLODipine (NORVASC) 5 MG tablet Take 1 tablet (5 mg total) by mouth daily. 90 tablet 0   Aspirin-Salicylamide-Caffeine (BC HEADACHE POWDER PO) Take 1-2 packets by mouth every 4 (four) hours as needed (migraines).     metoprolol succinate (TOPROL-XL) 25 MG 24 hr tablet Take 0.5 tablets (12.5 mg total) by mouth daily. 45 tablet 0   cetirizine (ZYRTEC) 10 MG tablet Take 1 tablet (10 mg total) by mouth daily. (Patient not taking: Reported on 05/13/2022) 30 tablet 11   diclofenac (VOLTAREN) 75 MG EC tablet Take 1 tablet (75 mg total) by mouth 2 (two) times daily. (Patient not taking: Reported on 05/13/2022) 30 tablet 0   fluticasone (FLONASE) 50 MCG/ACT nasal spray Place 2 sprays into both nostrils daily. (Patient not taking: Reported on 05/13/2022) 16 g 6   pantoprazole (PROTONIX) 40 MG tablet Take 1 tablet (40 mg total) by mouth daily. (Patient not taking: Reported on 05/13/2022) 90 tablet 3   No current facility-administered medications on file prior to visit.      HPI   Jennifer Forbes is a 52 y.o.-year-old female, referred by her PCP, Dr.Nichols, for evaluation for multinodular goiter.  Thyroid U/S: 11/28/19 CLINICAL DATA:  Incidental on CT.  Thyromegaly on chest CT.   EXAM: THYROID ULTRASOUND   TECHNIQUE: Ultrasound examination of the thyroid gland and adjacent soft tissues was performed.   COMPARISON:  Chest CT-11/08/2019   FINDINGS: Parenchymal Echotexture: Mildly heterogenous   Isthmus: Normal in size measuring 0.5 cm in diameter   Right lobe: Enlarged measuring 6.9 x 2.6 x 2.1 cm   Left lobe: Enlarged measuring 7.2 x 2.3 x 2.3 cm   _________________________________________________________   Estimated total number of nodules >/= 1 cm: 6-10   Number of spongiform nodules >/=  2 cm not described below (TR1): 0   Number  of mixed cystic and solid nodules >/= 1.5 cm not described below (TR2): 0   _________________________________________________________   Nodule # 1:   Location: Right; Mid   Maximum size: 2.0 cm; Other 2 dimensions: 1.8 x 1.2 cm   Composition: solid/almost completely solid (2)   Echogenicity: isoechoic (1)   Shape: not taller-than-wide (0)   Margins: ill-defined (0)   Echogenic foci: none (0)   ACR TI-RADS total points: 3.   ACR TI-RADS risk category: TR3 (3 points).   ACR TI-RADS recommendations:   *Given size (>/= 1.5 - 2.4 cm) and appearance, a follow-up ultrasound in 1 year should be considered based on TI-RADS criteria.   _________________________________________________________   Nodule # 2:   Location: Right; Mid   Maximum size: 2.7 cm; Other 2 dimensions: 2.5 x 1.6 cm   Composition: solid/almost completely solid (2)   Echogenicity: isoechoic (1)   Shape: not taller-than-wide (0)   Margins: ill-defined (0)   Echogenic foci: none (0)   ACR TI-RADS total points: 3.   ACR TI-RADS risk category: TR3 (3 points).   ACR TI-RADS recommendations:   **Given size (>/= 2.5 cm) and appearance, fine needle aspiration of this mildly suspicious nodule should be considered based on TI-RADS criteria.   _________________________________________________________   Nodule # 3:   Location: Right; Mid   Maximum size: 1.7 cm; Other 2 dimensions: 1.5 x 0.9 cm   Composition: solid/almost completely solid (2)   Echogenicity: isoechoic (1)   Shape: not taller-than-wide (0)   Margins: ill-defined (0)   Echogenic foci: none (0)   ACR TI-RADS total points: 3.   ACR TI-RADS risk category: TR3 (3 points).   ACR TI-RADS recommendations:   *Given size (>/= 1.5 - 2.4 cm) and appearance, a follow-up ultrasound in 1 year should be considered based on TI-RADS criteria.   _________________________________________________________    _________________________________________________________   Nodule # 4:   Location: Left; Inferior   Maximum size: 3.7 cm; Other 2 dimensions: 3.1 x 2.3 cm   Composition: solid/almost completely solid (2)   Echogenicity: isoechoic (1)   Shape: not taller-than-wide (0)   Margins: ill-defined (0)   Echogenic foci: none (0)   ACR TI-RADS total points: 3.   ACR TI-RADS risk category: TR3 (3 points).   ACR TI-RADS recommendations:   **Given size (>/= 2.5 cm) and appearance, fine needle aspiration of this mildly suspicious nodule should be considered based on TI-RADS criteria.   _________________________________________________________   There is an approximately 1.4 x 1.2 x 1.0 cm isoechoic ill-defined nodule/pseudonodule involving the mid, posterior aspect the right lobe of the thyroid (labeled 5), which does not meet criteria to recommend percutaneous sampling or continued dedicated follow-up   _________________________________________________________   Nodule # 6:   Location: Left; Superior   Maximum size: 2.1 cm; Other 2 dimensions: 1.8 x 1.1 cm   Composition:  solid/almost completely solid (2)   Echogenicity: isoechoic (1)   Shape: not taller-than-wide (0)   Margins: ill-defined (0)   Echogenic foci: none (0)   ACR TI-RADS total points: 3.   ACR TI-RADS risk category: TR3 (3 points).   ACR TI-RADS recommendations:   *Given size (>/= 1.5 - 2.4 cm) and appearance, a follow-up ultrasound in 1 year should be considered based on TI-RADS criteria.   _________________________________________________________   Loraine Maple approximately 2.0 x 1.9 x 0.9 cm isoechoic nodule within the inferior pole the left lobe of the thyroid (labeled 7) is favored to represent a pseudonodule as it lacks defined borders on both provided transverse and sagittal images.   IMPRESSION: 1. Thyromegaly with findings suggestive of multinodular goiter. 2. Nodules #2 and #4 both  meet imaging criteria to recommend percutaneous sampling as indicated. 3. Nodules #1, #3 and #6 all meet imaging criteria to recommend a 1 year follow-up as indicated.   The above is in keeping with the ACR TI-RADS recommendations - J Am Coll Radiol 2017;14:587-595.     Electronically Signed   By: Sandi Mariscal M.D.   On: 11/28/2019 12:42   I reviewed pt's thyroid tests: Lab Results  Component Value Date   TSH 1.092 06/03/2022   TSH 0.869 01/15/2022   TSH 1.660 10/03/2019   FREET4 0.70 06/03/2022     No FH of thyroid ds. No FH of thyroid cancer. No h/o radiation tx to head or neck.  Mom did have parathyroid gland removed.  No seaweed or kelp. No recent contrast studies. No steroid use. No herbal supplements. No Biotin supplements or Hair, Skin and Nails vitamins.   Review of systems  Constitutional: + stable body weight,  current Body mass index is 31.38 kg/m. , + fatigue, fluctuating heat/cold intolerance Eyes: no blurry vision, no xerophthalmia ENT: no sore throat, + nodules palpated in throat, + dysphagia/odynophagia, + hoarseness Cardiovascular: no chest pain, no shortness of breath, + palpitations, no leg swelling Respiratory: no cough, no shortness of breath Gastrointestinal: no nausea/vomiting/diarrhea Musculoskeletal: no muscle/joint aches Skin: no rashes, no hyperemia Neurological: no tremors, no numbness, no tingling, no dizziness Psychiatric: no depression, no anxiety  ---------------------------------------------------------------------------------------------------------------------- Objective    BP 112/75 (BP Location: Right Arm, Patient Position: Sitting, Cuff Size: Large)   Pulse 74   Ht 5\' 8"  (1.727 m)   Wt 206 lb 6.4 oz (93.6 kg)   LMP 01/20/2019   BMI 31.38 kg/m    BP Readings from Last 3 Encounters:  07/14/22 112/75  06/11/22 132/85  05/13/22 117/74    Wt Readings from Last 3 Encounters:  07/14/22 206 lb 6.4 oz (93.6 kg)  06/11/22 206 lb  12.8 oz (93.8 kg)  05/13/22 208 lb (94.3 kg)     Physical Exam- Limited  Constitutional:  Body mass index is 31.38 kg/m. , not in acute distress, normal state of mind Eyes:  EOMI, no exophthalmos Musculoskeletal: no gross deformities, strength intact in all four extremities, no gross restriction of joint movements Skin:  no rashes, no hyperemia Neurological: no tremor with outstretched hands   Thyroid US from 06/03/22  CLINICAL DATA:  Goiter.   EXAM: THYROID ULTRASOUND   TECHNIQUE: Ultrasound examination of the thyroid gland and adjacent soft tissues was performed.   COMPARISON:  Prior thyroid ultrasound 11/28/2019   FINDINGS: Parenchymal Echotexture: Moderately heterogenous   Isthmus: 0.6 cm   Right lobe: 6.4 x 2.2 x 3.1 cm   Left lobe: 8.2 x 3.2 x 2.9 cm  _________________________________________________________   Estimated total number of nodules >/= 1 cm: 5   Number of spongiform nodules >/=  2 cm not described below (TR1): 0   Number of mixed cystic and solid nodules >/= 1.5 cm not described below (TR2): 0   _________________________________________________________   Nodule # 1:   Prior biopsy: No   Location: Right; Mid   Maximum size: 3.4 cm; Other 2 dimensions: 2.1 x 2.5 cm, previously, 2.5 x 1.6 x 2.7 cm   Composition: solid/almost completely solid (2)   Echogenicity: isoechoic (1)   Shape: not taller-than-wide (0)   Margins: ill-defined (0)   Echogenic foci: none (0)   ACR TI-RADS total points: 3.   ACR TI-RADS risk category:  TR3 (3 points).   Significant change in size (>/= 20% in two dimensions and minimal increase of 2 mm): Yes   Change in features: No   Change in ACR TI-RADS risk category: No   ACR TI-RADS recommendations:   **Given size (>/= 2.5 cm) and appearance, fine needle aspiration of this mildly suspicious nodule should be considered based on TI-RADS criteria.    _________________________________________________________   Nodule # 2:   Prior biopsy: No   Location: Right; Superior   Maximum size: 2.1 cm; Other 2 dimensions: 1.3 x 1.2 cm, previously, 1.6 x 0.9 x 1.5 cm   Composition: solid/almost completely solid (2)   Echogenicity: isoechoic (1)   Shape: not taller-than-wide (0)   Margins: ill-defined (0)   Echogenic foci: none (0)   ACR TI-RADS total points: 3.   ACR TI-RADS risk category:  TR3 (3 points).   Significant change in size (>/= 20% in two dimensions and minimal increase of 2 mm): Yes   Change in features: No   Change in ACR TI-RADS risk category: No   ACR TI-RADS recommendations:   *Given size (>/= 1.5 - 2.4 cm) and appearance, a follow-up ultrasound in 1 year should be considered based on TI-RADS criteria.   _________________________________________________________   Nodule # 1 LEFT:   Prior biopsy: No   Location: Left; Mid   Maximum size: 3.3 cm; Other 2 dimensions: 1.7 x 2.5 cm, previously, 3.6 x 2.3 x 3.1 cm   Composition: solid/almost completely solid (2)   Echogenicity: isoechoic (1)   Shape: not taller-than-wide (0)   Margins: ill-defined (0)   Echogenic foci: none (0)   ACR TI-RADS total points: 3.   ACR TI-RADS risk category:  TR3 (3 points).   Significant change in size (>/= 20% in two dimensions and minimal increase of 2 mm): Yes   Change in features: No   Change in ACR TI-RADS risk category: No   ACR TI-RADS recommendations:   **Given size (>/= 2.5 cm) and appearance, fine needle aspiration of this mildly suspicious nodule should be considered based on TI-RADS criteria.   _________________________________________________________   Nodule # 2 LEFT:   Prior biopsy: No   Location: Left; Superior   Maximum size: 2.4 cm; Other 2 dimensions: 1.3 x 2.4 cm, previously, 2.1 x 1.4 x 1.7 cm   Composition: solid/almost completely solid (2)   Echogenicity: isoechoic (1)    Shape: not taller-than-wide (0)   Margins: ill-defined (0)   Echogenic foci: none (0)   ACR TI-RADS total points: 3.   ACR TI-RADS risk category:  TR3 (3 points).   Significant change in size (>/= 20% in two dimensions and minimal increase of 2 mm): Yes   Change in features: No   Change in ACR TI-RADS  risk category: No   ACR TI-RADS recommendations:   *Given size (>/= 1.5 - 2.4 cm) and appearance, a follow-up ultrasound in 1 year should be considered based on TI-RADS criteria.   _________________________________________________________   Nodule # 3 LEFT:   Prior biopsy: No   Location: Left; Inferior   Maximum size: 2.1 cm; Other 2 dimensions: 2.1 x 1.7 cm, previously, 2.0 x 1.9 x 0.9 cm   Composition: solid/almost completely solid (2)   Echogenicity: isoechoic (1)   Shape: not taller-than-wide (0)   Margins: ill-defined (0)   Echogenic foci: none (0)   ACR TI-RADS total points: 3.   ACR TI-RADS risk category:  TR3 (3 points).   Significant change in size (>/= 20% in two dimensions and minimal increase of 2 mm): No   Change in features: No   Change in ACR TI-RADS risk category: No   ACR TI-RADS recommendations:   *Given size (>/= 1.5 - 2.4 cm) and appearance, a follow-up ultrasound in 1 year should be considered based on TI-RADS criteria.   _________________________________________________________   IMPRESSION: 1. Enlarging TI-RADS category 3 nodule in the right mid gland (labeled #1 above) continues to meet criteria to consider fine-needle aspiration biopsy. 2. TI-RADS category 3 nodule in the left mid gland (labeled # 1 LEFT) is slightly smaller in size but continues to meet criteria to consider fine-needle aspiration biopsy. 3. Additional nodules as above demonstrates slow interval growth and continue to meet criteria for imaging surveillance. Recommend follow-up ultrasound in 1-2 years.   The above is in keeping with the ACR TI-RADS  recommendations - J Am Coll Radiol 2017;14:587-595.     Electronically Signed   By: Malachy Moan M.D.   On: 06/05/2022 10:27     Latest Reference Range & Units 10/03/19 10:01 01/15/22 10:08 06/03/22 14:19  TSH 0.350 - 4.500 uIU/mL 1.660 0.869 1.092  Triiodothyronine,Free,Serum 2.0 - 4.4 pg/mL   2.4  T4,Free(Direct) 0.61 - 1.12 ng/dL   2.58  Thyroglobulin ng/mL   160 (H)  Thyroperoxidase Ab SerPl-aCnc 0 - 34 IU/mL   11  (H): Data is abnormally high  Cytology from FNA on 06/30/22 CYTOLOGY - NON PAP CASE: [id number] PATIENT: Jennifer Forbes Non-Gynecological Cytology Report     Clinical History: None provided Specimen Submitted:  B. THYROID, LEFT, FINE NEEDLE ASPIRATION:   FINAL MICROSCOPIC DIAGNOSIS: - Consistent with benign follicular nodule (Bethesda category II)  SPECIMEN ADEQUACY: Satisfactory for evaluation  GROSS: Received is/are (B)(1)2 Slides ( 1 quick stain). (2)2 Slides  (1quick stain). (3)2 Slides (1quick stain). Also received 5cc's of clear colorless saline solution from needle rinses and 6 slides in ethyl alcohol. (BEE:TB:tb) Smears: (B)6 Concentration Method (Thin Prep):(B)1 Cell Block: (B) Cell block attempted, but not obtained Additional Studies: Afirma collected     Final Diagnosis performed by Jimmy Picket, MD.   Electronically signed 07/02/2022 Technical and / or Professional components performed at [facility]. [facility], [address], [place], [place] [address].  Immunohistochemistry Technical component (if applicable) was performed at [facility]. [address], [place], [place] [address].   IMMUNOHISTOCHEMISTRY DISCLAIMER (if applicable): Some of these immunohistochemical stains may have been developed and the performance characteristics determine by [facility]. Some may not have been cleared or approved by the U.S. Food and Drug Administration. The FDA has determined that such  clearance or approval is not necessary. This test is used for clinical purposes. It should not be regarded as investigational or for research.  This laboratory is certified under the Clinical Laboratory Improvement Amendments of 1988 (CLIA-88) as qualified to perform high complexity clinical laboratory testing.  The controls stained appropriately.  ----------------------------------------------------------------------------------------------------------------------  ASSESSMENT / PLAN:  1. Thyroid Nodule 2. Positive thyroid antibodies  - She had her thyroid FNA on 06/30/22 and results were found to be benign.  She did have significant swelling post FNA and temporary worsening of her already significant compression symptoms.  She does have significant neck compression symptoms, therefore we will send her for surgical evaluation for total thyroidectomy with Dr. Gerrit Friends at [facility].  She is aware that she will need to be started on thyroid hormone replacement which she will be on the rest of her life and agrees to proceed and move forward.  Her thyroid function tests are normal, indicating her thyroid is functioning properly despite having several nodules.  Her antibody testing was positive indicating she is genetically predisposed to developing thyroid condition in the future.  FOLLOW UP PLAN:  Return in about 2 months (around 09/12/2022) for Thyroid follow up, surgical consult with Dr. Gerrit Friends, Previsit labs.   She will return here about 2-4 weeks after surgery for evaluation of thyroid hormone replacement needs.     I spent  28  minutes in the care of the patient today including review of labs from Thyroid Function, CMP, and other relevant labs ; imaging/biopsy records (current and previous including abstractions from other facilities); face-to-face time discussing  her lab results and symptoms, medications doses, her options of short and long term treatment based on the latest  standards of care / guidelines;   and documenting the encounter.  [name]  participated in the discussions, expressed understanding, and voiced agreement with the above plans.  All questions were answered to her satisfaction. she is encouraged to contact clinic should she have any questions or concerns prior to her return visit.    Ronny Bacon, [facility] [address] [place], [place] [address] Phone: [phone] Fax: [phone]

## 2022-07-15 ENCOUNTER — Ambulatory Visit (INDEPENDENT_AMBULATORY_CARE_PROVIDER_SITE_OTHER): Admitting: Nurse Practitioner

## 2022-07-15 ENCOUNTER — Encounter: Payer: Self-pay | Admitting: Nurse Practitioner

## 2022-07-15 ENCOUNTER — Other Ambulatory Visit: Payer: Self-pay

## 2022-07-15 VITALS — BP 112/63 | HR 78 | Temp 97.7°F | Ht 68.0 in | Wt 207.0 lb

## 2022-07-15 DIAGNOSIS — F32A Depression, unspecified: Secondary | ICD-10-CM

## 2022-07-15 DIAGNOSIS — K219 Gastro-esophageal reflux disease without esophagitis: Secondary | ICD-10-CM

## 2022-07-15 DIAGNOSIS — R110 Nausea: Secondary | ICD-10-CM | POA: Diagnosis not present

## 2022-07-15 DIAGNOSIS — G8929 Other chronic pain: Secondary | ICD-10-CM | POA: Diagnosis not present

## 2022-07-15 DIAGNOSIS — R1013 Epigastric pain: Secondary | ICD-10-CM | POA: Diagnosis not present

## 2022-07-15 DIAGNOSIS — M5442 Lumbago with sciatica, left side: Secondary | ICD-10-CM

## 2022-07-15 DIAGNOSIS — Z1211 Encounter for screening for malignant neoplasm of colon: Secondary | ICD-10-CM | POA: Diagnosis not present

## 2022-07-15 MED ORDER — DICLOFENAC SODIUM 75 MG PO TBEC: 75.0000 mg | DELAYED_RELEASE_TABLET | Freq: Two times a day (BID) | ORAL | 0 refills | Status: DC

## 2022-07-15 MED ORDER — PANTOPRAZOLE SODIUM 40 MG PO TBEC: 40.0000 mg | DELAYED_RELEASE_TABLET | Freq: Every day | ORAL | 3 refills | Status: AC

## 2022-07-15 MED ORDER — DULOXETINE HCL 20 MG PO CPEP: 20.0000 mg | ORAL_CAPSULE | Freq: Every day | ORAL | 0 refills | Status: DC

## 2022-07-15 MED ORDER — ONDANSETRON 4 MG PO TBDP: 4.0000 mg | ORAL_TABLET | Freq: Three times a day (TID) | ORAL | 0 refills | Status: DC | PRN

## 2022-07-15 NOTE — Progress Notes (Signed)
@Patient  ID: Jennifer Forbes, female    DOB: [birth year], 52 y.o.   MRN: [id number]  Chief Complaint  Patient presents with   Back Pain   Gastroesophageal Reflux    Referring provider: Fenton Foy, NP   HPI  Patient presents today for a follow-up on back pain.  At her last visit here she was prescribed diclofenac but never picked up the prescription.  Patient states that she continues to have low back pain which has been a chronic issue for several years.  She does have lumbar spine degenerative changes upon previous x-ray.  We will refer patient to Ortho for further evaluation and treatment we will refer patient for physical therapy.  Patient also complains today of severe reflux.  She has ran out of her Protonix.  We will refill this today.  Patient is due for colon cancer screening.  We will refer her to GI to follow-up on GERD and for colon cancer screening.  Patient states that she is becoming depressed due to having pain.  We will trial Cymbalta for depression and hopefully to help with pain as well. Denies f/c/s, n/v/d, hemoptysis, PND, leg swelling Denies chest pain or edema       Allergies  Allergen Reactions   Amoxicillin Rash    Immunization History  Administered Date(s) Administered   Influenza,inj,Quad PF,6+ Mos 02/14/2019   Pneumococcal Polysaccharide-23 10/22/2015   Tdap 06/22/2016    Past Medical History:  Diagnosis Date   Arthritis    Left knee   Headache    Hypertension    Tachycardia    Per patient    Tobacco History: Social History   Tobacco Use  Smoking Status Every Day   Packs/day: 0.25   Types: Cigars, Cigarettes  Smokeless Tobacco Never  Tobacco Comments   black and mild smokes about 3 per day   Ready to quit: Not Answered Counseling given: Not Answered Tobacco comments: black and mild smokes about 3 per day   Outpatient Encounter Medications as of 07/15/2022  Medication Sig   amLODipine (NORVASC) 5 MG tablet Take 1 tablet (5  mg total) by mouth daily.   Aspirin-Salicylamide-Caffeine (BC HEADACHE POWDER PO) Take 1-2 packets by mouth every 4 (four) hours as needed (migraines).   DULoxetine (CYMBALTA) 20 MG capsule Take 1 capsule (20 mg total) by mouth daily.   metoprolol succinate (TOPROL-XL) 25 MG 24 hr tablet Take 0.5 tablets (12.5 mg total) by mouth daily.   ondansetron (ZOFRAN-ODT) 4 MG disintegrating tablet Take 1 tablet (4 mg total) by mouth every 8 (eight) hours as needed for nausea or vomiting.   [DISCONTINUED] pantoprazole (PROTONIX) 40 MG tablet Take 1 tablet (40 mg total) by mouth daily.   cetirizine (ZYRTEC) 10 MG tablet Take 1 tablet (10 mg total) by mouth daily. (Patient not taking: Reported on 05/13/2022)   diclofenac (VOLTAREN) 75 MG EC tablet Take 1 tablet (75 mg total) by mouth 2 (two) times daily.   fluticasone (FLONASE) 50 MCG/ACT nasal spray Place 2 sprays into both nostrils daily. (Patient not taking: Reported on 05/13/2022)   pantoprazole (PROTONIX) 40 MG tablet Take 1 tablet (40 mg total) by mouth daily.   [DISCONTINUED] diclofenac (VOLTAREN) 75 MG EC tablet Take 1 tablet (75 mg total) by mouth 2 (two) times daily. (Patient not taking: Reported on 05/13/2022)   No facility-administered encounter medications on file as of 07/15/2022.     Review of Systems  Review of Systems  Constitutional: Negative.   HENT: Negative.  Cardiovascular: Negative.   Gastrointestinal: Negative.   Musculoskeletal:  Positive for back pain.  Allergic/Immunologic: Negative.   Neurological: Negative.   Psychiatric/Behavioral: Negative.         Physical Exam  BP 112/63   Pulse 78   Temp 97.7 F (36.5 C)   Ht 5\' 8"  (1.727 m)   Wt 207 lb (93.9 kg)   LMP 01/20/2019   SpO2 100%   BMI 31.47 kg/m   Wt Readings from Last 5 Encounters:  07/15/22 207 lb (93.9 kg)  07/14/22 206 lb 6.4 oz (93.6 kg)  06/11/22 206 lb 12.8 oz (93.8 kg)  05/13/22 208 lb (94.3 kg)  01/15/22 207 lb 6 oz (94.1 kg)     Physical  Exam Vitals and nursing note reviewed.  Constitutional:      General: She is not in acute distress.    Appearance: She is well-developed.  Cardiovascular:     Rate and Rhythm: Normal rate and regular rhythm.  Pulmonary:     Effort: Pulmonary effort is normal.     Breath sounds: Normal breath sounds.  Neurological:     Mental Status: She is alert and oriented to person, place, and time.      Lab Results:  CBC    Component Value Date/Time   WBC 4.5 01/15/2022 1008   WBC 4.9 09/23/2021 0858   RBC 4.79 01/15/2022 1008   RBC 4.40 09/23/2021 0858   HGB 15.4 01/15/2022 1008   HCT 44.7 01/15/2022 1008   PLT 311 01/15/2022 1008   MCV 93 01/15/2022 1008   MCH 32.2 01/15/2022 1008   MCH 32.5 09/23/2021 0858   MCHC 34.5 01/15/2022 1008   MCHC 33.3 09/23/2021 0858   RDW 13.0 01/15/2022 1008   LYMPHSABS 1.7 11/08/2019 1701   LYMPHSABS 2.2 10/03/2019 1001   MONOABS 1.3 (H) 11/08/2019 1701   EOSABS 0.3 11/08/2019 1701   EOSABS 0.2 10/03/2019 1001   BASOSABS 0.1 11/08/2019 1701   BASOSABS 0.1 10/03/2019 1001    BMET    Component Value Date/Time   NA 140 01/15/2022 1008   K 5.1 01/15/2022 1008   CL 102 01/15/2022 1008   CO2 22 01/15/2022 1008   GLUCOSE 73 01/15/2022 1008   GLUCOSE 75 09/23/2021 0858   BUN 10 01/15/2022 1008   CREATININE 0.69 01/15/2022 1008   CREATININE 0.62 05/06/2017 1459   CALCIUM 9.7 01/15/2022 1008   GFRNONAA >60 09/23/2021 0858   GFRNONAA 110 05/06/2017 1459   GFRAA >60 11/08/2019 1701   GFRAA 127 05/06/2017 1459    BNP No results found for: "BNP"  ProBNP No results found for: "PROBNP"  Imaging: [place] FNA BX THYROID 1ST LESION AFIRMA  Result Date: 06/30/2022 INDICATION: Indeterminate thyroid nodules of the right and left thyroid. Request is made for fine-needle aspiration of indeterminate nodules. EXAM: ULTRASOUND GUIDED FINE NEEDLE ASPIRATION OF INDETERMINATE THYROID NODULE COMPARISON:  US THYROID 06/03/2022 MEDICATIONS: 10 mL 1% lidocaine  COMPLICATIONS: None immediate. TECHNIQUE: Informed written consent was obtained from the patient after a discussion of the risks, benefits and alternatives to treatment. Questions regarding the procedure were encouraged and answered. A timeout was performed prior to the initiation of the procedure. Pre-procedural ultrasound scanning demonstrated unchanged size and appearance of the indeterminate nodules within the right and left thyroid. The procedure was planned. The neck was prepped in the usual sterile fashion, and a sterile drape was applied covering the operative field. A timeout was performed prior to the initiation of the procedure. Local anesthesia  was provided with 1% lidocaine. Under direct ultrasound guidance, 5 FNA biopsies were performed of the right thyroid nodule with a 25 gauge needle. Multiple ultrasound images were saved for procedural documentation purposes. The samples were prepared and submitted to pathology. Sample was also submitted for Afirma testing. Under direct ultrasound guidance, 5 FNA biopsies were performed of the left thyroid nodule with a 25 gauge needle. Multiple ultrasound images were saved for procedural documentation purposes. The samples were prepared and submitted to pathology. Sample was also submitted for Afirma testing. Limited post procedural scanning was negative for hematoma or additional complication. Dressings were placed. The patient tolerated the above procedures procedure well without immediate postprocedural complication. FINDINGS: Nodule reference number based on prior diagnostic ultrasound: 1 Maximum size: 3.4 cm Location: Right; Mid ACR TI-RADS risk category: TR3 (3 points) Reason for biopsy: meets ACR TI-RADS criteria _________________________________________________________ Nodule reference number based on prior diagnostic ultrasound: 1 Maximum size: 3.3 cm Location: Left; Mid ACR TI-RADS risk category: TR3 (3 points) Reason for biopsy: meets ACR TI-RADS  criteria Ultrasound imaging confirms appropriate placement of the needles within the thyroid nodule. IMPRESSION: 1. Technically successful ultrasound guided fine needle aspiration of right thyroid nodule. 2. Technically successful ultrasound guided fine needle aspiration of left thyroid nodule. Read by: Brynda Greathouse PA-C Electronically Signed   By: Corrie Mckusick D.O.   On: 06/30/2022 14:16   [place] FNA BX THYROID EA ADD LESION AFIRMA  Result Date: 06/30/2022 INDICATION: Indeterminate thyroid nodules of the right and left thyroid. Request is made for fine-needle aspiration of indeterminate nodules. EXAM: ULTRASOUND GUIDED FINE NEEDLE ASPIRATION OF INDETERMINATE THYROID NODULE COMPARISON:  US THYROID 06/03/2022 MEDICATIONS: 10 mL 1% lidocaine COMPLICATIONS: None immediate. TECHNIQUE: Informed written consent was obtained from the patient after a discussion of the risks, benefits and alternatives to treatment. Questions regarding the procedure were encouraged and answered. A timeout was performed prior to the initiation of the procedure. Pre-procedural ultrasound scanning demonstrated unchanged size and appearance of the indeterminate nodules within the right and left thyroid. The procedure was planned. The neck was prepped in the usual sterile fashion, and a sterile drape was applied covering the operative field. A timeout was performed prior to the initiation of the procedure. Local anesthesia was provided with 1% lidocaine. Under direct ultrasound guidance, 5 FNA biopsies were performed of the right thyroid nodule with a 25 gauge needle. Multiple ultrasound images were saved for procedural documentation purposes. The samples were prepared and submitted to pathology. Sample was also submitted for Afirma testing. Under direct ultrasound guidance, 5 FNA biopsies were performed of the left thyroid nodule with a 25 gauge needle. Multiple ultrasound images were saved for procedural documentation purposes. The samples  were prepared and submitted to pathology. Sample was also submitted for Afirma testing. Limited post procedural scanning was negative for hematoma or additional complication. Dressings were placed. The patient tolerated the above procedures procedure well without immediate postprocedural complication. FINDINGS: Nodule reference number based on prior diagnostic ultrasound: 1 Maximum size: 3.4 cm Location: Right; Mid ACR TI-RADS risk category: TR3 (3 points) Reason for biopsy: meets ACR TI-RADS criteria _________________________________________________________ Nodule reference number based on prior diagnostic ultrasound: 1 Maximum size: 3.3 cm Location: Left; Mid ACR TI-RADS risk category: TR3 (3 points) Reason for biopsy: meets ACR TI-RADS criteria Ultrasound imaging confirms appropriate placement of the needles within the thyroid nodule. IMPRESSION: 1. Technically successful ultrasound guided fine needle aspiration of right thyroid nodule. 2. Technically successful ultrasound guided fine needle aspiration of left  thyroid nodule. Read by: Loyce Dys PA-C Electronically Signed   By: Gilmer Mor D.O.   On: 06/30/2022 14:16     Assessment & Plan:   Gastroesophageal reflux disease without esophagitis - pantoprazole (PROTONIX) 40 MG tablet; Take 1 tablet (40 mg total) by mouth daily.  Dispense: 90 tablet; Refill: 3 - Ambulatory referral to Gastroenterology  2. Colon cancer screening  - Ambulatory referral to Gastroenterology  3. Epigastric pain  - Ambulatory referral to Gastroenterology  4. Chronic midline low back pain with left-sided sciatica  - Ambulatory referral to Orthopedic Surgery - Ambulatory referral to Physical Therapy  5. 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  Dispense: 30 tablet; Refill: 0  - diclofenac (VOLTAREN) 75 MG EC tablet; Take 1 tablet (75 mg total)  by mouth 2 (two) times daily.  Dispense: 30 tablet; Refill: 0  6. Nausea  - ondansetron (ZOFRAN-ODT) 4 MG disintegrating tablet; Take 1 tablet (4 mg total) by mouth every 8 (eight) hours as needed for nausea or vomiting.  Dispense: 20 tablet; Refill: 0  7. Depression, unspecified depression type  - DULoxetine (CYMBALTA) 20 MG capsule; Take 1 capsule (20 mg total) by mouth daily.  Dispense: 30 capsule; Refill: 0    Follow up:  Follow up in 3 months     Ivonne Andrew, NP 07/15/2022

## 2022-07-15 NOTE — Assessment & Plan Note (Signed)
-   pantoprazole (PROTONIX) 40 MG tablet; Take 1 tablet (40 mg total) by mouth daily.  Dispense: 90 tablet; Refill: 3 - Ambulatory referral to Gastroenterology  2. Colon cancer screening  - Ambulatory referral to Gastroenterology  3. Epigastric pain  - Ambulatory referral to Gastroenterology  4. Chronic midline low back pain with left-sided sciatica  - Ambulatory referral to Orthopedic Surgery - Ambulatory referral to Physical Therapy  5. 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  Dispense: 30 tablet; Refill: 0  - diclofenac (VOLTAREN) 75 MG EC tablet; Take 1 tablet (75 mg total) by mouth 2 (two) times daily.  Dispense: 30 tablet; Refill: 0  6. Nausea  - ondansetron (ZOFRAN-ODT) 4 MG disintegrating tablet; Take 1 tablet (4 mg total) by mouth every 8 (eight) hours as needed for nausea or vomiting.  Dispense: 20 tablet; Refill: 0  7. Depression, unspecified depression type  - DULoxetine (CYMBALTA) 20 MG capsule; Take 1 capsule (20 mg total) by mouth daily.  Dispense: 30 capsule; Refill: 0    Follow up:  Follow up in 3 months

## 2022-07-15 NOTE — Patient Instructions (Addendum)
1. Gastroesophageal reflux disease without esophagitis  - pantoprazole (PROTONIX) 40 MG tablet; Take 1 tablet (40 mg total) by mouth daily.  Dispense: 90 tablet; Refill: 3 - Ambulatory referral to Gastroenterology  2. Colon cancer screening  - Ambulatory referral to Gastroenterology  3. Epigastric pain  - Ambulatory referral to Gastroenterology  4. Chronic midline low back pain with left-sided sciatica  - Ambulatory referral to Orthopedic Surgery - Ambulatory referral to Physical Therapy  5. 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  Dispense: 30 tablet; Refill: 0  - diclofenac (VOLTAREN) 75 MG EC tablet; Take 1 tablet (75 mg total) by mouth 2 (two) times daily.  Dispense: 30 tablet; Refill: 0  6. Nausea  - ondansetron (ZOFRAN-ODT) 4 MG disintegrating tablet; Take 1 tablet (4 mg total) by mouth every 8 (eight) hours as needed for nausea or vomiting.  Dispense: 20 tablet; Refill: 0  7. Depression, unspecified depression type  - DULoxetine (CYMBALTA) 20 MG capsule; Take 1 capsule (20 mg total) by mouth daily.  Dispense: 30 capsule; Refill: 0    Follow up:  Follow up in 3 months

## 2022-07-16 ENCOUNTER — Other Ambulatory Visit: Payer: Self-pay

## 2022-07-19 DIAGNOSIS — R32 Unspecified urinary incontinence: Secondary | ICD-10-CM | POA: Diagnosis not present

## 2022-07-27 ENCOUNTER — Ambulatory Visit (INDEPENDENT_AMBULATORY_CARE_PROVIDER_SITE_OTHER): Admitting: Orthopedic Surgery

## 2022-07-27 ENCOUNTER — Encounter: Payer: Self-pay | Admitting: Orthopedic Surgery

## 2022-07-27 ENCOUNTER — Ambulatory Visit (INDEPENDENT_AMBULATORY_CARE_PROVIDER_SITE_OTHER)

## 2022-07-27 VITALS — BP 120/84 | HR 80 | Ht 68.0 in | Wt 207.0 lb

## 2022-07-27 DIAGNOSIS — M545 Low back pain, unspecified: Secondary | ICD-10-CM
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
## 2022-07-28 ENCOUNTER — Ambulatory Visit (INDEPENDENT_AMBULATORY_CARE_PROVIDER_SITE_OTHER): Admitting: Clinical

## 2022-07-28 DIAGNOSIS — F32A Depression, unspecified: Secondary | ICD-10-CM | POA: Diagnosis not present
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
## 2022-07-29 ENCOUNTER — Encounter: Payer: Self-pay | Admitting: Gastroenterology

## 2022-08-10 ENCOUNTER — Ambulatory Visit: Payer: Medicaid Other | Admitting: Clinical

## 2022-08-10 ENCOUNTER — Ambulatory Visit: Payer: Self-pay | Admitting: Clinical

## 2022-08-11 ENCOUNTER — Ambulatory Visit: Payer: Self-pay | Admitting: Surgery

## 2022-08-11 DIAGNOSIS — E042 Nontoxic multinodular goiter: Secondary | ICD-10-CM | POA: Diagnosis not present

## 2022-08-11 DIAGNOSIS — R1310 Dysphagia, unspecified: Secondary | ICD-10-CM | POA: Diagnosis not present

## 2022-08-12 ENCOUNTER — Ambulatory Visit: Payer: Medicaid Other | Admitting: Physical Therapy

## 2022-08-12 NOTE — Therapy (Deleted)
OUTPATIENT PHYSICAL THERAPY THORACOLUMBAR EVALUATION  Patient Name: Jennifer Forbes MRN: [id number] DOB:Mar 09, 1971, 52 y.o., female Today's Date: 08/12/2022    Past Medical History:  Diagnosis Date   Arthritis    Left knee   Headache    Hypertension    Tachycardia    Per patient   Past Surgical History:  Procedure Laterality Date   BREAST BIOPSY  2015   left breast   BREAST LUMPECTOMY WITH RADIOACTIVE SEED LOCALIZATION Left 10/01/2021   Procedure: LEFT BREAST LUMPECTOMY WITH RADIOACTIVE SEED LOCALIZATION;  Surgeon: Jennifer Kussmaul, MD;  Location: [place];  Service: General;  Laterality: Left;   LIPOMA RESECTION  march 2014   right shoulder    SKIN BIOPSY     Patient Active Problem List   Diagnosis Date Noted   Multiple thyroid nodules 01/15/2022   Abnormal EKG 05/09/2017   Tachycardia 05/09/2017   Essential hypertension 10/24/2015   Rheumatoid arthritis involving left knee ([place]) 10/24/2015   Gastroesophageal reflux disease without esophagitis 10/24/2015   Tobacco dependence 10/24/2015   Morbid obesity ([place]) 10/24/2015    PCP: Jennifer Foy, NP  REFERRING PROVIDER: Fenton Foy, NP  THERAPY DIAG:  No diagnosis found.  REFERRING DIAG: Chronic midline low back pain with left-sided sciatica [M54.42, G89.29]   Rationale for Evaluation and Treatment:  Rehabilitation  SUBJECTIVE:  PERTINENT PAST HISTORY:  RA        PRECAUTIONS: {Therapy precautions:24002}  WEIGHT BEARING RESTRICTIONS {Yes ***/No:24003}  FALLS:  Has patient fallen in last 6 months? {yes/no:20286}, Number of falls: ***  MOI/History of condition:  Onset date: ***  SUBJECTIVE STATEMENT  Jennifer Forbes is a 52 y.o. female who presents to clinic with chief complaint of ***.  ***  From referring provider:   "HPI   Patient presents today for a follow-up on back pain.  At her last visit here she was prescribed diclofenac but never picked up the prescription.  Patient states that she  continues to have low back pain which has been a chronic issue for several years.  She does have lumbar spine degenerative changes upon previous x-ray.  We will refer patient to Ortho for further evaluation and treatment we will refer patient for physical therapy.  Patient also complains today of severe reflux.  She has ran out of her Protonix.  We will refill this today.  Patient is due for colon cancer screening.  We will refer her to GI to follow-up on GERD and for colon cancer screening.  Patient states that she is becoming depressed due to having pain.  We will trial Cymbalta for depression and hopefully to help with pain as well. Denies f/c/s, n/v/d, hemoptysis, PND, leg swelling Denies chest pain or edema"   Red flags:  {has/denies:26543} {kerredflag:26542}  Pain:  Are you having pain? {yes/no:20286} Pain location: *** NPRS scale:  {NUMBERS; 0-10:5044}/10 to {NUMBERS; 0-10:5044}/10 Aggravating factors: *** Relieving factors: *** Pain description: {PAIN DESCRIPTION:21022940} Stage: {Desc; acute/subacute/chronic:13799} Stability: {kerbetterworse:26715} 24 hour pattern: ***   Occupation: ***  Assistive Device: ***  Hand Dominance: ***  Patient Goals/Specific Activities: ***   OBJECTIVE:   DIAGNOSTIC FINDINGS:  ***  GENERAL OBSERVATION/GAIT:  ***  SENSATION:  Light touch: {intact/deficits:24005}  LUMBAR AROM  AROM AROM  08/12/2022  Flexion {kerromlxflex:28296}  Extension {kerromcxlx:26716}  Right lateral flexion {kerromcxlx:26716}  Left lateral flexion {kerromcxlx:26716}  Right rotation {kerromcxlx:26716}  Left rotation {kerromcxlx:26716}    (Blank rows = not tested)   LE MMT:  MMT Right 08/12/2022 Left  08/12/2022  Hip flexion (L2, L3) *** ***  Knee extension (L3) *** ***  Knee flexion *** ***  Hip abduction *** ***  Hip extension *** ***  Hip external rotation    Hip internal rotation    Hip adduction    Ankle dorsiflexion (L4)    Ankle plantarflexion  (S1)    Ankle inversion    Ankle eversion    Great Toe ext (L5)    Grossly     (Blank rows = not tested, score listed is out of 5 possible points.  N = WNL, D = diminished, C = clear for gross weakness with myotome testing, * = concordant pain with testing)   LUMBAR SPECIAL TESTS:  Straight leg raise: L (***), R (***) Slump: L (***), R (***)  MUSCLE LENGTH: Hamstrings: Right {kerminsig:27227} restriction; Left {kerminsig:27227} restriction ASLR: Right {keraslr:27228}; Left {keraslr:27228} Marcello Moores test: Right {kerminsig:27227} restriction; Left {kerminsig:27227} restriction Ely's test: Right {kerminsig:27227} restriction; Left {kerminsig:27227} restriction  LE ROM:  ROM Right 08/12/2022 Left 08/12/2022  Hip flexion    Hip extension    Hip abduction    Hip adduction    Hip internal rotation    Hip external rotation    Knee flexion    Knee extension    Ankle dorsiflexion    Ankle plantarflexion    Ankle inversion    Ankle eversion      (Blank rows = not tested, N = WNL, * = concordant pain with testing)  Functional Tests  Eval (08/12/2022)                                                                PALPATION:   ***  PATIENT SURVEYS:  {rehab surveys:24030}   TODAY'S TREATMENT  ***  PATIENT EDUCATION:  POC, diagnosis, prognosis, HEP, and outcome measures.  Pt educated via explanation, demonstration, and handout (HEP).  Pt confirms understanding verbally.   HOME EXERCISE PROGRAM: ***  Treatment priorities   Eval (08/12/2022)                                                  ASSESSMENT:  CLINICAL IMPRESSION: Jennifer Forbes is a 52 y.o. female who presents to clinic with signs and sxs consistent with ***.    OBJECTIVE IMPAIRMENTS: Pain, ***  ACTIVITY LIMITATIONS: ***  PERSONAL FACTORS: See medical history and pertinent history   REHAB POTENTIAL: {rehabpotential:25112}  CLINICAL DECISION MAKING: {clinical decision making:25114}  EVALUATION  COMPLEXITY: {Evaluation complexity:25115}   GOALS:   SHORT TERM GOALS: Target date: 09/09/2022  Jennifer Forbes will be >75% HEP compliant to improve carryover between sessions and facilitate independent management of condition  Evaluation (08/12/2022): ongoing Goal status: INITIAL   LONG TERM GOALS: Target date: 10/07/2022  Jennifer Forbes will improve FOTO score to *** as a proxy for functional improvement  Evaluation/Baseline (08/12/2022): *** Goal status: INITIAL   2.  Quinesha will self report >/= 50% decrease in pain from evaluation   Evaluation/Baseline (08/12/2022): ***/10 max pain Goal status: INITIAL   3.  ***   4.  ***   5.  ***   6.  ***   PLAN: PT FREQUENCY:  1-2x/week  PT DURATION: 8 weeks (Ending 10/07/2022)  PLANNED INTERVENTIONS: Therapeutic exercises, Aquatic therapy, Therapeutic activity, Neuro Muscular re-education, Gait training, Patient/Family education, Joint mobilization, Dry Needling, Electrical stimulation, Spinal mobilization and/or manipulation, Moist heat, Taping, Vasopneumatic device, Ionotophoresis '4mg'$ /ml Dexamethasone, and Manual therapy    Shearon Balo PT, DPT 08/12/2022, 7:28 AM

## 2022-08-18 NOTE — Progress Notes (Signed)
COVID Vaccine Completed:  Date of COVID positive in last 90 days:  PCP - Lazaro Arms, NP Cardiologist -   Chest x-ray -  EKG - 09/23/21 Epic Stress Test -  ECHO -  Cardiac Cath -  Pacemaker/ICD device last checked: Spinal Cord Stimulator:  Bowel Prep -   Sleep Study -  CPAP -   Fasting Blood Sugar -  Checks Blood Sugar _____ times a day  Last dose of GLP1 agonist-  N/A GLP1 instructions:  N/A   Last dose of SGLT-2 inhibitors-  N/A SGLT-2 instructions: N/A   Blood Thinner Instructions: Aspirin Instructions: Last Dose:  Activity level:  Can go up a flight of stairs and perform activities of daily living without stopping and without symptoms of chest pain or shortness of breath.  Able to exercise without symptoms  Unable to go up a flight of stairs without symptoms of     Anesthesia review:   Patient denies shortness of breath, fever, cough and chest pain at PAT appointment  Patient verbalized understanding of instructions that were given to them at the PAT appointment. Patient was also instructed that they will need to review over the PAT instructions again at home before surgery.

## 2022-08-19 ENCOUNTER — Encounter (HOSPITAL_COMMUNITY)
Admission: RE | Admit: 2022-08-19 | Discharge: 2022-08-19 | Disposition: A | Discharge status: Home / Self Care | Source: Ambulatory Visit | Attending: Surgery | Admitting: Surgery

## 2022-08-19 ENCOUNTER — Other Ambulatory Visit: Payer: Self-pay

## 2022-08-19 ENCOUNTER — Encounter (HOSPITAL_COMMUNITY): Payer: Self-pay

## 2022-08-19 VITALS — BP 136/88 | HR 71 | Temp 97.7°F | Resp 16 | Ht 68.0 in | Wt 201.0 lb

## 2022-08-19 DIAGNOSIS — Z01812 Encounter for preprocedural laboratory examination: Secondary | ICD-10-CM | POA: Diagnosis present

## 2022-08-19 DIAGNOSIS — I10 Essential (primary) hypertension: Secondary | ICD-10-CM

## 2022-08-19 HISTORY — DX: Gastro-esophageal reflux disease without esophagitis: K21.9

## 2022-08-19 HISTORY — DX: Anxiety disorder, unspecified: F41.9

## 2022-08-19 HISTORY — DX: Pneumonia, unspecified organism: J18.9

## 2022-08-19 LAB — CBC
HCT: 42.5 % (ref 36.0–46.0)
Hemoglobin: 13.8 g/dL (ref 12.0–15.0)
MCH: 32.5 pg (ref 26.0–34.0)
MCHC: 32.5 g/dL (ref 30.0–36.0)
MCV: 100.2 fL — ABNORMAL HIGH (ref 80.0–100.0)
Platelets: 242 10*3/uL (ref 150–400)
RBC: 4.24 MIL/uL (ref 3.87–5.11)
RDW: 14.9 % (ref 11.5–15.5)
WBC: 3.6 10*3/uL — ABNORMAL LOW (ref 4.0–10.5)
nRBC: 0 % (ref 0.0–0.2)

## 2022-08-19 LAB — BASIC METABOLIC PANEL
Anion gap: 9 (ref 5–15)
BUN: 14 mg/dL (ref 6–20)
CO2: 23 mmol/L (ref 22–32)
Calcium: 8.8 mg/dL — ABNORMAL LOW (ref 8.9–10.3)
Chloride: 108 mmol/L (ref 98–111)
Creatinine, Ser: 0.58 mg/dL (ref 0.44–1.00)
GFR, Estimated: >60 mL/min (ref >60)
Glucose, Bld: 77 mg/dL (ref 70–99)
Potassium: 3.5 mmol/L (ref 3.5–5.1)
Sodium: 140 mmol/L (ref 135–145)

## 2022-08-19 NOTE — Patient Instructions (Signed)
SURGICAL WAITING ROOM VISITATION  Patients having surgery or a procedure may have no more than 2 support people in the waiting area - these visitors may rotate.    Children under the age of 48 must have an adult with them who is not the patient.  Due to an increase in RSV and influenza rates and associated hospitalizations, children ages 69 and under may not visit patients in [place].  If the patient needs to stay at the hospital during part of their recovery, the visitor guidelines for inpatient rooms apply. Pre-op nurse will coordinate an appropriate time for 1 support person to accompany patient in pre-op.  This support person may not rotate.    Please refer to the [facility] website for the visitor guidelines for Inpatients (after your surgery is over and you are in a regular room).    Your procedure is scheduled on: 08/24/22   Report to [facility] Main Entrance    Report to admitting at AM   Call this number if you have problems the morning of surgery [phone]   Do not eat food :After Midnight.   After Midnight you may have the following liquids until  4:30 AM DAY OF SURGERY  Water Non-Citrus Juices (without pulp, NO RED-Apple, White grape, White cranberry) Black Coffee (NO MILK/CREAM OR CREAMERS, sugar ok)  Clear Tea (NO MILK/CREAM OR CREAMERS, sugar ok) regular and decaf                             Plain Jell-O (NO RED)                                           Fruit ices (not with fruit pulp, NO RED)                                     Popsicles (NO RED)                                                               Sports drinks like Gatorade (NO RED)                  If you have questions, please contact your surgeon's office.   FOLLOW BOWEL PREP AND ANY ADDITIONAL PRE OP INSTRUCTIONS YOU RECEIVED FROM YOUR SURGEON'S OFFICE!!!     Oral Hygiene is also important to reduce your risk of infection.                                    Remember -  BRUSH YOUR TEETH THE MORNING OF SURGERY WITH YOUR REGULAR TOOTHPASTE  DENTURES WILL BE REMOVED PRIOR TO SURGERY PLEASE DO NOT APPLY "Poly grip" OR ADHESIVES!!!   Take these medicines the morning of surgery with A SIP OF WATER: Amlodipine, Duloxetine, Metoprolol, Zofran, Pantoprazole  You may not have any metal on your body including hair pins, jewelry, and body piercing             Do not wear make-up, lotions, powders, perfumes, or deodorant  Do not wear nail polish including gel and S&S, artificial/acrylic nails, or any other type of covering on natural nails including finger and toenails. If you have artificial nails, gel coating, etc. that needs to be removed by a nail salon please have this removed prior to surgery or surgery may need to be canceled/ delayed if the surgeon/ anesthesia feels like they are unable to be safely monitored.   Do not shave  48 hours prior to surgery.    Do not bring valuables to the hospital. [place].   Contacts, glasses, dentures or bridgework may not be worn into surgery.   Bring small overnight bag day of surgery.   DO NOT [place]. PHARMACY WILL DISPENSE MEDICATIONS LISTED ON YOUR MEDICATION LIST TO YOU DURING YOUR ADMISSION [place]!   Special Instructions: Bring a copy of your healthcare power of attorney and living will documents the day of surgery if you haven't scanned them before.              Please read over the following fact sheets you were given: IF [place] [name]    If you received a COVID test during your pre-op visit  it is requested that you wear a mask when out in public, stay away from anyone that may not be feeling well and notify your surgeon if you develop symptoms. If you test positive for Covid or have been in contact with anyone that has tested  positive in the last 10 days please notify you surgeon.    [place] - Preparing for Surgery Before surgery, you can play an important role.  Because skin is not sterile, your skin needs to be as free of germs as possible.  You can reduce the number of germs on your skin by washing with CHG (chlorahexidine gluconate) soap before surgery.  CHG is an antiseptic cleaner which kills germs and bonds with the skin to continue killing germs even after washing. Please DO NOT use if you have an allergy to CHG or antibacterial soaps.  If your skin becomes reddened/irritated stop using the CHG and inform your nurse when you arrive at Short Stay. Do not shave (including legs and underarms) for at least 48 hours prior to the first CHG shower.  You may shave your face/neck.  Please follow these instructions carefully:  1.  Shower with CHG Soap the night before surgery and the  morning of surgery.  2.  If you choose to wash your hair, wash your hair first as usual with your normal  shampoo.  3.  After you shampoo, rinse your hair and body thoroughly to remove the shampoo.                             4.  Use CHG as you would any other liquid soap.  You can apply chg directly to the skin and wash.  Gently with a scrungie or clean washcloth.  5.  Apply the CHG Soap to your body ONLY FROM THE NECK DOWN.  Do   not use on face/ open                           Wound or open sores. Avoid contact with eyes, ears mouth and   genitals (private parts).                       Wash face,  Genitals (private parts) with your normal soap.             6.  Wash thoroughly, paying special attention to the area where your    surgery  will be performed.  7.  Thoroughly rinse your body with warm water from the neck down.  8.  DO NOT shower/wash with your normal soap after using and rinsing off the CHG Soap.                9.  Pat yourself dry with a clean towel.            10.  Wear clean pajamas.            11.  Place clean sheets  on your bed the night of your first shower and do not  sleep with pets. Day of Surgery : Do not apply any lotions/deodorants the morning of surgery.  Please wear clean clothes to the hospital/surgery center.  FAILURE TO FOLLOW THESE INSTRUCTIONS MAY RESULT IN THE CANCELLATION OF YOUR SURGERY  PATIENT SIGNATURE_________________________________  NURSE SIGNATURE__________________________________  ________________________________________________________________________

## 2022-08-21 ENCOUNTER — Other Ambulatory Visit: Payer: Self-pay | Admitting: Nurse Practitioner

## 2022-08-21 DIAGNOSIS — F32A Depression, unspecified: Secondary | ICD-10-CM

## 2022-08-21 DIAGNOSIS — I10 Essential (primary) hypertension: Secondary | ICD-10-CM

## 2022-08-22 ENCOUNTER — Encounter (HOSPITAL_COMMUNITY): Payer: Self-pay | Admitting: Surgery

## 2022-08-22 DIAGNOSIS — R1310 Dysphagia, unspecified: Secondary | ICD-10-CM

## 2022-08-22 NOTE — H&P (Signed)
REFERRING PHYSICIAN: Brita Romp, NP  PROVIDER: Othniel Maret Charlotta Newton, MD   Chief Complaint: New Consultation (Multinodular thyroid goiter)  History of Present Illness:  Patient is referred by Rayetta Pigg, NP, for surgical evaluation and management of a multinodular thyroid goiter with compressive symptoms. Patient was initially diagnosed with thyroid nodules approximately 3 years ago when undergoing a CT scan for evaluation of pneumonia. Patient recently had ultrasound in December 2023 showing an enlarged thyroid gland with the right lobe measuring 6.4 cm in the left lobe measuring 8.2 cm. Bilateral thyroid nodules were noted. Dominant nodule on each side underwent fine-needle aspiration biopsy with findings of a benign follicular nodule, Bethesda category II. However the patient complains of compressive symptoms including dysphagia particularly with pills, positional dyspnea, and neck discomfort. She describes a globus sensation. She has never been on thyroid medication. She has had no prior head or neck surgery. There is a family history of thyroid nodules in the patient's mother who underwent partial thyroidectomy. There is no family history of thyroid cancer or other endocrine neoplasm. Patient presents today to discuss thyroidectomy for management of multinodular thyroid goiter with compressive symptoms.  Review of Systems: A complete review of systems was obtained from the patient. I have reviewed this information and discussed as appropriate with the patient. See HPI as well for other ROS.  Review of Systems Constitutional: Negative. HENT: Globus sensation Eyes: Negative. Respiratory: Positive for shortness of breath. Cardiovascular: Negative. Gastrointestinal: Dysphagia Genitourinary: Negative. Musculoskeletal: Negative. Skin: Negative. Neurological: Negative. Endo/Heme/Allergies: Negative. Psychiatric/Behavioral: Negative.   Medical History: Past Medical  History: Diagnosis Date Arthritis GERD (gastroesophageal reflux disease) Hypertension Seizures (CMS-HCC)  Patient Active Problem List Diagnosis Calcification of left breast Thyroid nodule Multinodular goiter (nontoxic) Dysphagia  Past Surgical History: Procedure Laterality Date MASTECTOMY PARTIAL / LUMPECTOMY Left 10/01/2021 lipo right shoulder   Allergies Allergen Reactions Amoxicillin Rash  Current Outpatient Medications on File Prior to Visit Medication Sig Dispense Refill DULoxetine (CYMBALTA) 20 MG DR capsule Take 1 capsule by mouth once daily pantoprazole (PROTONIX) 40 MG DR tablet Take 40 mg by mouth once daily amLODIPine (NORVASC) 5 MG tablet Take 5 mg by mouth once daily methocarbamoL (ROBAXIN) 500 MG tablet TAKE 1 TABLET (500 MG TOTAL) BY MOUTH AT BEDTIME AS NEEDED FOR MUSCLE SPASMS. metoprolol succinate (TOPROL-XL) 25 MG XL tablet Take 12.5 mg by mouth once daily  No current facility-administered medications on file prior to visit.  Family History Problem Relation Age of Onset High blood pressure (Hypertension) Mother Stroke Father Coronary Artery Disease (Blocked arteries around heart) Father Deep vein thrombosis (DVT or abnormal blood clot formation) Sister Breast cancer Sister Diabetes Brother   Social History  Tobacco Use Smoking Status Every Day Types: Cigarettes Smokeless Tobacco Current   Social History  Socioeconomic History Marital status: Legally Separated Tobacco Use Smoking status: Every Day Types: Cigarettes Smokeless tobacco: Current Substance and Sexual Activity Alcohol use: Never Drug use: Never  Objective:  Vitals: PainSc: 2   Physical Exam  GENERAL APPEARANCE Comfortable, no acute issues Development: normal Gross deformities: none  SKIN Rash, lesions, ulcers: none Induration, erythema: none Nodules: none palpable  EYES Conjunctiva and lids: normal Pupils: equal and reactive  EARS, NOSE, MOUTH,  THROAT External ears: no lesion or deformity External nose: no lesion or deformity Hearing: grossly normal  NECK Symmetric: yes Trachea: midline Thyroid: Palpation of the right thyroid lobe shows a dominant nodule measuring approximately 3 cm in size, relatively soft, smooth, mobile with swallowing. There  is additional nodularity in both the right and left thyroid lobes. There are no dominant nor discrete masses. There is no associated lymphadenopathy. There is mild tenderness to palpation.  CHEST Respiratory effort: normal Retraction or accessory muscle use: no Breath sounds: normal bilaterally Rales, rhonchi, wheeze: none  CARDIOVASCULAR Auscultation: regular rhythm, normal rate Murmurs: none Pulses: radial pulse 2+ palpable Lower extremity edema: none  ABDOMEN Not assessed  GENITOURINARY/RECTAL Not assessed  MUSCULOSKELETAL Station and gait: normal Digits and nails: no clubbing or cyanosis Muscle strength: grossly normal all extremities Range of motion: grossly normal all extremities Deformity: none  LYMPHATIC Cervical: none palpable Supraclavicular: none palpable  PSYCHIATRIC Oriented to person, place, and time: yes Mood and affect: normal for situation Judgment and insight: appropriate for situation   Assessment and Plan:  Multinodular goiter (nontoxic) Dysphagia, unspecified type  Patient is referred by Rayetta Pigg, NP, for surgical evaluation and management of multinodular thyroid goiter, nontoxic, with compressive symptoms.  Patient provided with a copy of "The Thyroid Book: Medical and Surgical Treatment of Thyroid Problems", published by [employer], 16 pages. Book reviewed and explained to patient during visit today.  Today we discussed her clinical history. We reviewed her imaging studies and cytopathology results. We reviewed her laboratory studies. We discussed options for management including observation. However, given her compressive symptoms, the  patient is very interested in proceeding with thyroidectomy. We discussed total thyroidectomy as the procedure of choice. We discussed the risk and benefits of surgery including the risk of recurrent laryngeal nerve injury and injury to parathyroid glands. We discussed the size and location of the surgical incision. We discussed the hospital stay to be anticipated. We discussed the need for lifelong thyroid hormone replacement therapy. We discussed her postoperative recovery and return to activities. The patient understands and wishes to proceed with surgery in the near future.  Armandina Gemma, MD [facility] A [place] practice Office: [phone]

## 2022-08-23 NOTE — Telephone Encounter (Signed)
Please advise K[facility] 

## 2022-08-24 ENCOUNTER — Other Ambulatory Visit: Payer: Self-pay

## 2022-08-24 ENCOUNTER — Encounter (HOSPITAL_COMMUNITY)
Admission: RE | Disposition: A | Discharge status: Home / Self Care | Payer: Self-pay | Source: Home / Self Care | Attending: Surgery

## 2022-08-24 ENCOUNTER — Encounter (HOSPITAL_COMMUNITY): Payer: Self-pay | Admitting: Surgery

## 2022-08-24 ENCOUNTER — Ambulatory Visit (HOSPITAL_COMMUNITY)
Admission: RE | Admit: 2022-08-24 | Discharge: 2022-08-25 | Disposition: A | Discharge status: Home / Self Care | Attending: Surgery | Admitting: Surgery

## 2022-08-24 ENCOUNTER — Ambulatory Visit (HOSPITAL_BASED_OUTPATIENT_CLINIC_OR_DEPARTMENT_OTHER): Admitting: Registered Nurse

## 2022-08-24 ENCOUNTER — Ambulatory Visit (HOSPITAL_COMMUNITY): Admitting: Registered Nurse

## 2022-08-24 DIAGNOSIS — F419 Anxiety disorder, unspecified: Secondary | ICD-10-CM | POA: Insufficient documentation

## 2022-08-24 DIAGNOSIS — I10 Essential (primary) hypertension: Secondary | ICD-10-CM | POA: Insufficient documentation

## 2022-08-24 DIAGNOSIS — M069 Rheumatoid arthritis, unspecified: Secondary | ICD-10-CM | POA: Insufficient documentation

## 2022-08-24 DIAGNOSIS — F17210 Nicotine dependence, cigarettes, uncomplicated: Secondary | ICD-10-CM | POA: Diagnosis not present

## 2022-08-24 DIAGNOSIS — Z79899 Other long term (current) drug therapy: Secondary | ICD-10-CM | POA: Diagnosis not present

## 2022-08-24 DIAGNOSIS — R1310 Dysphagia, unspecified: Secondary | ICD-10-CM | POA: Diagnosis not present

## 2022-08-24 DIAGNOSIS — E042 Nontoxic multinodular goiter: Secondary | ICD-10-CM | POA: Diagnosis present

## 2022-08-24 DIAGNOSIS — R32 Unspecified urinary incontinence: Secondary | ICD-10-CM | POA: Diagnosis not present

## 2022-08-24 DIAGNOSIS — M1990 Unspecified osteoarthritis, unspecified site: Secondary | ICD-10-CM | POA: Diagnosis not present

## 2022-08-24 DIAGNOSIS — K219 Gastro-esophageal reflux disease without esophagitis: Secondary | ICD-10-CM | POA: Diagnosis not present

## 2022-08-24 HISTORY — PX: THYROIDECTOMY: SHX17

## 2022-08-24 SURGERY — THYROIDECTOMY
Anesthesia: General

## 2022-08-24 MED ORDER — TRAMADOL HCL 50 MG PO TABS
50.0000 mg | ORAL_TABLET | Freq: Four times a day (QID) | ORAL | Status: DC | PRN
  Filled 2022-08-24: qty 1

## 2022-08-24 MED ORDER — EPHEDRINE 5 MG/ML INJ
INTRAVENOUS | Status: AC
  Filled 2022-08-24: qty 5

## 2022-08-24 MED ORDER — PANTOPRAZOLE SODIUM 40 MG PO TBEC
40.0000 mg | DELAYED_RELEASE_TABLET | Freq: Every day | ORAL | Status: DC
  Administered 2022-08-25: 40 mg via ORAL
  Filled 2022-08-24: qty 1

## 2022-08-24 MED ORDER — ONDANSETRON 4 MG PO TBDP: 4.0000 mg | ORAL_TABLET | Freq: Four times a day (QID) | ORAL | Status: DC | PRN

## 2022-08-24 MED ORDER — METOPROLOL SUCCINATE ER 25 MG PO TB24
12.5000 mg | ORAL_TABLET | Freq: Every day | ORAL | Status: DC
  Administered 2022-08-25: 12.5 mg via ORAL
  Filled 2022-08-24 (×2): qty 1

## 2022-08-24 MED ORDER — FENTANYL CITRATE (PF) 100 MCG/2ML IJ SOLN
INTRAMUSCULAR | Status: AC
  Filled 2022-08-24: qty 2

## 2022-08-24 MED ORDER — ONDANSETRON HCL 4 MG/2ML IJ SOLN
INTRAMUSCULAR | Status: DC | PRN
  Administered 2022-08-24: 4 mg via INTRAVENOUS

## 2022-08-24 MED ORDER — FENTANYL CITRATE PF 50 MCG/ML IJ SOSY
25.0000 ug | PREFILLED_SYRINGE | INTRAMUSCULAR | Status: DC | PRN
  Administered 2022-08-24: 25 ug via INTRAVENOUS
  Administered 2022-08-24 (×2): 50 ug via INTRAVENOUS

## 2022-08-24 MED ORDER — CHLORHEXIDINE GLUCONATE CLOTH 2 % EX PADS: 6.0000 | MEDICATED_PAD | Freq: Once | CUTANEOUS | Status: DC

## 2022-08-24 MED ORDER — 0.9 % SODIUM CHLORIDE (POUR BTL) OPTIME
TOPICAL | Status: DC | PRN
  Administered 2022-08-24: 1000 mL

## 2022-08-24 MED ORDER — ACETAMINOPHEN 500 MG PO TABS
1000.0000 mg | ORAL_TABLET | Freq: Once | ORAL | Status: AC
  Administered 2022-08-24: 1000 mg via ORAL
  Filled 2022-08-24: qty 2

## 2022-08-24 MED ORDER — PROPOFOL 10 MG/ML IV BOLUS
INTRAVENOUS | Status: DC | PRN
  Administered 2022-08-24: 150 mg via INTRAVENOUS

## 2022-08-24 MED ORDER — FENTANYL CITRATE (PF) 100 MCG/2ML IJ SOLN
INTRAMUSCULAR | Status: DC | PRN
  Administered 2022-08-24: 25 ug via INTRAVENOUS
  Administered 2022-08-24 (×3): 50 ug via INTRAVENOUS
  Administered 2022-08-24: 25 ug via INTRAVENOUS

## 2022-08-24 MED ORDER — SUGAMMADEX SODIUM 200 MG/2ML IV SOLN
INTRAVENOUS | Status: DC | PRN
  Administered 2022-08-24: 170 mg via INTRAVENOUS

## 2022-08-24 MED ORDER — DEXAMETHASONE SODIUM PHOSPHATE 10 MG/ML IJ SOLN
INTRAMUSCULAR | Status: DC | PRN
  Administered 2022-08-24: 8 mg via INTRAVENOUS

## 2022-08-24 MED ORDER — EPHEDRINE SULFATE-NACL 50-0.9 MG/10ML-% IV SOSY
PREFILLED_SYRINGE | INTRAVENOUS | Status: DC | PRN
  Administered 2022-08-24: 5 mg via INTRAVENOUS

## 2022-08-24 MED ORDER — ONDANSETRON HCL 4 MG/2ML IJ SOLN
INTRAMUSCULAR | Status: AC
  Filled 2022-08-24: qty 2

## 2022-08-24 MED ORDER — DULOXETINE HCL 20 MG PO CPEP
20.0000 mg | ORAL_CAPSULE | Freq: Every day | ORAL | Status: DC
  Administered 2022-08-24 – 2022-08-25 (×2): 20 mg via ORAL
  Filled 2022-08-24 (×2): qty 1

## 2022-08-24 MED ORDER — ORAL CARE MOUTH RINSE: 15.0000 mL | Freq: Once | OROMUCOSAL | Status: AC

## 2022-08-24 MED ORDER — HEMOSTATIC AGENTS (NO CHARGE) OPTIME
TOPICAL | Status: DC | PRN
  Administered 2022-08-24: 1 via TOPICAL

## 2022-08-24 MED ORDER — CHLORHEXIDINE GLUCONATE 0.12 % MT SOLN
15.0000 mL | Freq: Once | OROMUCOSAL | Status: AC
  Administered 2022-08-24: 15 mL via OROMUCOSAL

## 2022-08-24 MED ORDER — LIDOCAINE 2% (20 MG/ML) 5 ML SYRINGE
INTRAMUSCULAR | Status: DC | PRN
  Administered 2022-08-24: 80 mg via INTRAVENOUS

## 2022-08-24 MED ORDER — DEXAMETHASONE SODIUM PHOSPHATE 10 MG/ML IJ SOLN
INTRAMUSCULAR | Status: AC
  Filled 2022-08-24: qty 1

## 2022-08-24 MED ORDER — SODIUM CHLORIDE 0.45 % IV SOLN: INTRAVENOUS | Status: DC

## 2022-08-24 MED ORDER — LACTATED RINGERS IV SOLN: INTRAVENOUS | Status: DC

## 2022-08-24 MED ORDER — FENTANYL CITRATE PF 50 MCG/ML IJ SOSY
PREFILLED_SYRINGE | INTRAMUSCULAR | Status: AC
  Filled 2022-08-24: qty 1

## 2022-08-24 MED ORDER — MIDAZOLAM HCL 5 MG/5ML IJ SOLN
INTRAMUSCULAR | Status: DC | PRN
  Administered 2022-08-24: 2 mg via INTRAVENOUS

## 2022-08-24 MED ORDER — AMLODIPINE BESYLATE 5 MG PO TABS
5.0000 mg | ORAL_TABLET | Freq: Every day | ORAL | Status: DC
  Administered 2022-08-24 – 2022-08-25 (×2): 5 mg via ORAL
  Filled 2022-08-24 (×2): qty 1

## 2022-08-24 MED ORDER — ONDANSETRON HCL 4 MG/2ML IJ SOLN: 4.0000 mg | Freq: Four times a day (QID) | INTRAMUSCULAR | Status: DC | PRN

## 2022-08-24 MED ORDER — ACETAMINOPHEN 325 MG PO TABS
650.0000 mg | ORAL_TABLET | Freq: Four times a day (QID) | ORAL | Status: DC | PRN
  Administered 2022-08-24: 650 mg via ORAL
  Filled 2022-08-24: qty 2

## 2022-08-24 MED ORDER — HYDROMORPHONE HCL 1 MG/ML IJ SOLN: 1.0000 mg | INTRAMUSCULAR | Status: DC | PRN

## 2022-08-24 MED ORDER — LIDOCAINE HCL (PF) 2 % IJ SOLN
INTRAMUSCULAR | Status: AC
  Filled 2022-08-24: qty 5

## 2022-08-24 MED ORDER — ROCURONIUM BROMIDE 10 MG/ML (PF) SYRINGE
PREFILLED_SYRINGE | INTRAVENOUS | Status: DC | PRN
  Administered 2022-08-24: 50 mg via INTRAVENOUS
  Administered 2022-08-24: 20 mg via INTRAVENOUS

## 2022-08-24 MED ORDER — ROCURONIUM BROMIDE 10 MG/ML (PF) SYRINGE
PREFILLED_SYRINGE | INTRAVENOUS | Status: AC
  Filled 2022-08-24: qty 10

## 2022-08-24 MED ORDER — ACETAMINOPHEN 650 MG RE SUPP: 650.0000 mg | Freq: Four times a day (QID) | RECTAL | Status: DC | PRN

## 2022-08-24 MED ORDER — OXYCODONE HCL 5 MG PO TABS
5.0000 mg | ORAL_TABLET | ORAL | Status: DC | PRN
  Administered 2022-08-24 – 2022-08-25 (×3): 10 mg via ORAL
  Filled 2022-08-24 (×3): qty 2

## 2022-08-24 MED ORDER — CIPROFLOXACIN IN D5W 400 MG/200ML IV SOLN
400.0000 mg | INTRAVENOUS | Status: AC
  Administered 2022-08-24: 400 mg via INTRAVENOUS
  Filled 2022-08-24: qty 200

## 2022-08-24 MED ORDER — PROPOFOL 10 MG/ML IV BOLUS
INTRAVENOUS | Status: AC
  Filled 2022-08-24: qty 20

## 2022-08-24 MED ORDER — FENTANYL CITRATE PF 50 MCG/ML IJ SOSY
PREFILLED_SYRINGE | INTRAMUSCULAR | Status: AC
  Administered 2022-08-24: 25 ug via INTRAVENOUS
  Filled 2022-08-24: qty 2

## 2022-08-24 MED ORDER — MIDAZOLAM HCL 2 MG/2ML IJ SOLN
INTRAMUSCULAR | Status: AC
  Filled 2022-08-24: qty 2

## 2022-08-24 MED ORDER — CALCIUM CARBONATE 1250 (500 CA) MG PO TABS
2.0000 | ORAL_TABLET | Freq: Three times a day (TID) | ORAL | Status: DC
  Administered 2022-08-24 – 2022-08-25 (×2): 2500 mg via ORAL
  Filled 2022-08-24 (×2): qty 2

## 2022-08-24 SURGICAL SUPPLY — 32 items
ADH SKN CLS APL DERMABOND .7 (GAUZE/BANDAGES/DRESSINGS) ×1
APL PRP STRL LF DISP 70% ISPRP (MISCELLANEOUS) ×1
ATTRACTOMAT 16X20 MAGNETIC DRP (DRAPES) ×1 IMPLANT
BAG COUNTER SPONGE SURGICOUNT (BAG) ×1 IMPLANT
BAG SPNG CNTER NS LX DISP (BAG) ×1
BLADE SURG 15 STRL LF DISP TIS (BLADE) ×1 IMPLANT
BLADE SURG 15 STRL SS (BLADE) ×1
CHLORAPREP W/TINT 26 (MISCELLANEOUS) ×1 IMPLANT
CLIP TI MEDIUM 6 (CLIP) ×2 IMPLANT
CLIP TI WIDE RED SMALL 6 (CLIP) ×2 IMPLANT
COVER SURGICAL LIGHT HANDLE (MISCELLANEOUS) ×1 IMPLANT
DERMABOND ADVANCED .7 DNX12 (GAUZE/BANDAGES/DRESSINGS) ×1 IMPLANT
DRAPE LAPAROTOMY T 98X78 PEDS (DRAPES) ×1 IMPLANT
DRAPE UTILITY XL STRL (DRAPES) ×1 IMPLANT
ELECT PENCIL ROCKER SW 15FT (MISCELLANEOUS) ×1 IMPLANT
ELECT REM PT RETURN 15FT ADLT (MISCELLANEOUS) ×1 IMPLANT
GAUZE 4X4 16PLY ~~LOC~~+RFID DBL (SPONGE) ×1 IMPLANT
GLOVE SURG ORTHO 8.0 STRL STRW (GLOVE) ×1 IMPLANT
GOWN STRL REUS W/ TWL XL LVL3 (GOWN DISPOSABLE) ×2 IMPLANT
GOWN STRL REUS W/TWL XL LVL3 (GOWN DISPOSABLE) ×2
HEMOSTAT SURGICEL 2X4 FIBR (HEMOSTASIS) ×1 IMPLANT
ILLUMINATOR WAVEGUIDE N/F (MISCELLANEOUS) ×1 IMPLANT
KIT BASIN OR (CUSTOM PROCEDURE TRAY) ×1 IMPLANT
KIT TURNOVER KIT A (KITS) IMPLANT
PACK BASIC VI WITH GOWN DISP (CUSTOM PROCEDURE TRAY) ×1 IMPLANT
SHEARS HARMONIC 9CM CVD (BLADE) ×1 IMPLANT
SUT MNCRL AB 4-0 PS2 18 (SUTURE) ×1 IMPLANT
SUT VIC AB 3-0 SH 18 (SUTURE) ×2 IMPLANT
SYR BULB IRRIG 60ML STRL (SYRINGE) ×1 IMPLANT
TOWEL OR 17X26 10 PK STRL BLUE (TOWEL DISPOSABLE) ×1 IMPLANT
TOWEL OR NON WOVEN STRL DISP B (DISPOSABLE) ×1 IMPLANT
TUBING CONNECTING 10 (TUBING) ×1 IMPLANT

## 2022-08-24 NOTE — Anesthesia Procedure Notes (Signed)
Procedure Name: Intubation Date/Time: 08/24/2022 7:33 AM  Performed by: Victoriano Lain, CRNAPre-anesthesia Checklist: Patient identified, Emergency Drugs available, Suction available, Timeout performed and Patient being monitored Patient Re-evaluated:Patient Re-evaluated prior to induction Oxygen Delivery Method: Circle system utilized Preoxygenation: Pre-oxygenation with 100% oxygen Induction Type: IV induction Ventilation: Mask ventilation without difficulty Laryngoscope Size: Mac and 4 Grade View: Grade I Tube type: Oral Tube size: 7.5 mm Number of attempts: 1 Airway Equipment and Method: Stylet Placement Confirmation: ETT inserted through vocal cords under direct vision, positive ETCO2 and breath sounds checked- equal and bilateral Secured at: 22 cm Tube secured with: Tape Dental Injury: Teeth and Oropharynx as per pre-operative assessment

## 2022-08-24 NOTE — Anesthesia Preprocedure Evaluation (Addendum)
Anesthesia Evaluation  Patient identified by MRN, date of birth, ID band Patient awake    Reviewed: Allergy & Precautions, NPO status , Patient's Chart, lab work & pertinent test results, reviewed documented beta blocker date and time   Airway Mallampati: I  TM Distance: >3 FB Neck ROM: Full    Dental  (+) Missing, Dental Advisory Given,    Pulmonary Current Smoker and Patient abstained from smoking.   Pulmonary exam normal breath sounds clear to auscultation       Cardiovascular hypertension, Pt. on home beta blockers and Pt. on medications Normal cardiovascular exam Rhythm:Regular Rate:Normal     Neuro/Psych  Headaches PSYCHIATRIC DISORDERS Anxiety        GI/Hepatic Neg liver ROS,GERD  ,,  Endo/Other  negative endocrine ROS    Renal/GU negative Renal ROS  negative genitourinary   Musculoskeletal  (+) Arthritis , Rheumatoid disorders,    Abdominal   Peds  Hematology negative hematology ROS (+)   Anesthesia Other Findings   Reproductive/Obstetrics                             Anesthesia Physical Anesthesia Plan  ASA: 3  Anesthesia Plan: General   Post-op Pain Management: Tylenol PO (pre-op)*   Induction: Intravenous  PONV Risk Score and Plan: 2 and Midazolam, Dexamethasone and Ondansetron  Airway Management Planned: Oral ETT  Additional Equipment:   Intra-op Plan:   Post-operative Plan: Extubation in OR  Informed Consent: I have reviewed the patients History and Physical, chart, labs and discussed the procedure including the risks, benefits and alternatives for the proposed anesthesia with the patient or authorized representative who has indicated his/her understanding and acceptance.     Dental advisory given  Plan Discussed with: CRNA  Anesthesia Plan Comments:        Anesthesia Quick Evaluation

## 2022-08-24 NOTE — Op Note (Signed)
Procedure Note  Pre-operative Diagnosis:  multinodular thyroid goiter with compressive symptoms  Post-operative Diagnosis:  same  Surgeon:  Armandina Gemma, MD  Assistant:  none   Procedure:  Total thyroidectomy  Anesthesia:  General  Estimated Blood Loss:  50 cc  Drains: none         Specimen: thyroid to pathology  Indications:  Patient is referred by Rayetta Pigg, NP, for surgical evaluation and management of a multinodular thyroid goiter with compressive symptoms. Patient was initially diagnosed with thyroid nodules approximately 3 years ago when undergoing a CT scan for evaluation of pneumonia. Patient recently had ultrasound in December 2023 showing an enlarged thyroid gland with the right lobe measuring 6.4 cm in the left lobe measuring 8.2 cm. Bilateral thyroid nodules were noted. Dominant nodule on each side underwent fine-needle aspiration biopsy with findings of a benign follicular nodule, Bethesda category II. However the patient complains of compressive symptoms including dysphagia particularly with pills, positional dyspnea, and neck discomfort. She describes a globus sensation. She has never been on thyroid medication. She has had no prior head or neck surgery. There is a family history of thyroid nodules in the patient's mother who underwent partial thyroidectomy. There is no family history of thyroid cancer or other endocrine neoplasm. Patient presents today to discuss thyroidectomy for management of multinodular thyroid goiter with compressive symptoms.   Procedure Details: Procedure was done in OR #6 at the [facility]. The patient was brought to the operating room and placed in a supine position on the operating room table. Following administration of general anesthesia, the patient was positioned and then prepped and draped in the usual aseptic fashion. After ascertaining that an adequate level of anesthesia had been achieved, a small Kocher incision was made with  #15 blade. Dissection was carried through subcutaneous tissues and platysma.Hemostasis was achieved with the electrocautery. Skin flaps were elevated cephalad and caudad from the thyroid notch to the sternal notch. A Mahorner self-retaining retractor was placed for exposure. Strap muscles were incised in the midline and dissection was begun on the left side.  Strap muscles were reflected laterally.  Left thyroid lobe was markedly enlarged and nodular.  The left lobe was gently mobilized with blunt dissection. Superior pole vessels were dissected out and divided individually between small and medium ligaclips with the harmonic scalpel. The thyroid lobe was rolled anteriorly. Branches of the inferior thyroid artery were divided between small ligaclips with the harmonic scalpel. Inferior venous tributaries were divided between ligaclips. Both the superior and inferior parathyroid glands were identified and preserved on their vascular pedicles. The recurrent laryngeal nerve was identified and preserved along its course. The ligament of Gwenlyn Found was released with the electrocautery and the gland was mobilized onto the anterior trachea. Isthmus was mobilized across the midline. There was no significant pyramidal lobe present. Dry pack was placed in the left neck.  The right thyroid lobe was gently mobilized with blunt dissection. Right thyroid lobe was moderately enlarged and nodular. Superior pole vessels were dissected out and divided between small and medium ligaclips with the Harmonic scalpel. Superior parathyroid was identified and preserved. Inferior venous tributaries were divided between medium ligaclips with the harmonic scalpel. The right thyroid lobe was rolled anteriorly and the branches of the inferior thyroid artery divided between small ligaclips. The right recurrent laryngeal nerve was identified and preserved along its course. The ligament of Gwenlyn Found was released with the electrocautery. The right thyroid  lobe was mobilized onto the anterior trachea and  the remainder of the thyroid was dissected off the anterior trachea and the thyroid was completely excised. A suture was used to mark the left lobe. The entire thyroid gland was submitted to pathology for review.  The neck was irrigated with warm saline. Fibrillar was placed throughout the operative field. Strap muscles were approximated in the midline with interrupted 3-0 Vicryl sutures. Platysma was closed with interrupted 3-0 Vicryl sutures. Skin was closed with a running 4-0 Monocryl subcuticular suture. Wound was washed and Dermabond was applied. The patient was awakened from anesthesia and brought to the recovery room. The patient tolerated the procedure well.   Armandina Gemma, [facility] Office: [phone]

## 2022-08-24 NOTE — Transfer of Care (Signed)
Immediate Anesthesia Transfer of Care Note  Patient: Jennifer Forbes  Procedure(s) Performed: TOTAL THYROIDECTOMY  Patient Location: PACU  Anesthesia Type:General  Level of Consciousness: awake, alert , oriented, and patient cooperative  Airway & Oxygen Therapy: Patient Spontanous Breathing and Patient connected to face mask oxygen  Post-op Assessment: Report given to RN, Post -op Vital signs reviewed and stable, and Patient moving all extremities  Post vital signs: Reviewed and stable  Last Vitals:  Vitals Value Taken Time  BP 146/93 08/24/22 0937  Temp    Pulse 63 08/24/22 0939  Resp    SpO2 100 % 08/24/22 0939  Vitals shown include unvalidated device data.  Last Pain:  Vitals:   08/24/22 0545  TempSrc: Oral         Complications: No notable events documented.

## 2022-08-24 NOTE — Interval H&P Note (Signed)
History and Physical Interval Note:  08/24/2022 7:03 AM  Jennifer Forbes [name]  has presented today for surgery, with the diagnosis of Payne.  The various methods of treatment have been discussed with the patient and family. After consideration of risks, benefits and other options for treatment, the patient has consented to    Procedure(s): TOTAL THYROIDECTOMY (N/A) as a surgical intervention.    The patient's history has been reviewed, patient examined, no change in status, stable for surgery.  I have reviewed the patient's chart and labs.  Questions were answered to the patient's satisfaction.    Armandina Gemma, [facility] [place] practice Office: [place]

## 2022-08-24 NOTE — Anesthesia Postprocedure Evaluation (Signed)
Anesthesia Post Note  Patient: Jennifer Forbes  Procedure(s) Performed: TOTAL THYROIDECTOMY     Patient location during evaluation: PACU Anesthesia Type: General Level of consciousness: awake and alert Pain management: pain level controlled Vital Signs Assessment: post-procedure vital signs reviewed and stable Respiratory status: spontaneous breathing, nonlabored ventilation, respiratory function stable and patient connected to nasal cannula oxygen Cardiovascular status: blood pressure returned to baseline and stable Postop Assessment: no apparent nausea or vomiting Anesthetic complications: no  No notable events documented.  Last Vitals:  Vitals:   08/24/22 1000 08/24/22 1015  BP: (!) 159/82 (!) 158/77  Pulse: (!) 55 (!) 56  Resp: 14 13  Temp:    SpO2: 100% 97%    Last Pain:  Vitals:   08/24/22 1015  TempSrc:   PainSc: 3                  [name] L Naiomi Musto

## 2022-08-25 ENCOUNTER — Encounter (HOSPITAL_COMMUNITY): Payer: Self-pay | Admitting: Surgery

## 2022-08-25 ENCOUNTER — Other Ambulatory Visit: Payer: Self-pay

## 2022-08-25 DIAGNOSIS — F32A Depression, unspecified: Secondary | ICD-10-CM

## 2022-08-25 DIAGNOSIS — I10 Essential (primary) hypertension: Secondary | ICD-10-CM

## 2022-08-25 DIAGNOSIS — E042 Nontoxic multinodular goiter: Secondary | ICD-10-CM | POA: Diagnosis not present

## 2022-08-25 DIAGNOSIS — G8929 Other chronic pain: Secondary | ICD-10-CM

## 2022-08-25 LAB — CALCIUM: Calcium: 8.8 mg/dL — ABNORMAL LOW (ref 8.9–10.3)

## 2022-08-25 LAB — SURGICAL PATHOLOGY

## 2022-08-25 MED ORDER — CALCIUM CARBONATE ANTACID 500 MG PO CHEW: 2.0000 | CHEWABLE_TABLET | Freq: Two times a day (BID) | ORAL | 1 refills | Status: DC

## 2022-08-25 MED ORDER — LEVOTHYROXINE SODIUM 100 MCG PO TABS: 100.0000 ug | ORAL_TABLET | Freq: Every day | ORAL | 3 refills | Status: DC

## 2022-08-25 MED ORDER — TRAMADOL HCL 50 MG PO TABS: 50.0000 mg | ORAL_TABLET | Freq: Four times a day (QID) | ORAL | 0 refills | Status: DC | PRN

## 2022-08-25 MED ORDER — DICLOFENAC SODIUM 75 MG PO TBEC: 75.0000 mg | DELAYED_RELEASE_TABLET | Freq: Two times a day (BID) | ORAL | 0 refills | Status: DC

## 2022-08-25 MED ORDER — DULOXETINE HCL 20 MG PO CPEP: 20.0000 mg | ORAL_CAPSULE | Freq: Every day | ORAL | 0 refills | Status: DC

## 2022-08-25 MED ORDER — AMLODIPINE BESYLATE 5 MG PO TABS: 5.0000 mg | ORAL_TABLET | Freq: Every day | ORAL | 0 refills | Status: DC

## 2022-08-25 NOTE — Discharge Summary (Signed)
Physician Discharge Summary   Patient ID: Jennifer Forbes MRN: [id number] DOB/AGE: 1970/09/12 52 y.o.  Admit date: 08/24/2022  Discharge date: 08/25/2022  Discharge Diagnoses:  Principal Problem:   Multinodular goiter (nontoxic) Active Problems:   Dysphagia   Discharged Condition: good  Hospital Course: Patient was admitted for observation following thyroid surgery.  Post op course was uncomplicated.  Pain was well controlled.  Tolerated diet.  Post op calcium level on morning following surgery was 8.8 mg/dl.  Patient was prepared for discharge home on POD#1.  Consults: None  Treatments: surgery: total thyroidectomy  Discharge Exam: Blood pressure (!) 159/93, pulse (!) 52, temperature 97.8 F (36.6 C), temperature source Oral, resp. rate 16, height 5\' 8"  (1.727 m), weight 91.2 kg, last menstrual period 01/20/2019, SpO2 98 %. HEENT - clear Neck - wound dry and intact; mild STS; voice normal; Dermabond in place  Disposition: Home  Discharge Instructions     Diet - low sodium heart healthy   Complete by: As directed    Increase activity slowly   Complete by: As directed    No dressing needed   Complete by: As directed       Allergies as of 08/25/2022       Reactions   Amoxicillin Rash        Medication List     TAKE these medications    amLODipine 5 MG tablet Commonly known as: NORVASC Take 1 tablet by mouth once daily   BC HEADACHE POWDER PO Take 1-2 packets by mouth every 4 (four) hours as needed (migraines).   calcium carbonate 500 MG chewable tablet Commonly known as: Tums Chew 2 tablets (400 mg of elemental calcium total) by mouth 2 (two) times daily.   cetirizine 10 MG tablet Commonly known as: ZYRTEC Take 1 tablet (10 mg total) by mouth daily.   diclofenac 75 MG EC tablet Commonly known as: VOLTAREN Take 1 tablet (75 mg total) by mouth 2 (two) times daily.   DULoxetine 20 MG capsule Commonly known as: CYMBALTA Take 1 capsule by mouth  once daily   fluticasone 50 MCG/ACT nasal spray Commonly known as: FLONASE Place 2 sprays into both nostrils daily.   levothyroxine 100 MCG tablet Commonly known as: Synthroid Take 1 tablet (100 mcg total) by mouth daily before breakfast.   metoprolol succinate 25 MG 24 hr tablet Commonly known as: TOPROL-XL Take 0.5 tablets (12.5 mg total) by mouth daily.   ondansetron 4 MG disintegrating tablet Commonly known as: ZOFRAN-ODT Take 1 tablet (4 mg total) by mouth every 8 (eight) hours as needed for nausea or vomiting.   pantoprazole 40 MG tablet Commonly known as: PROTONIX Take 1 tablet (40 mg total) by mouth daily.   traMADol 50 MG tablet Commonly known as: ULTRAM Take 1 tablet (50 mg total) by mouth every 6 (six) hours as needed for moderate pain.               Discharge Care Instructions  (From admission, onward)           Start     Ordered   08/25/22 0000  No dressing needed        08/25/22 [id number]            Follow-up Information     Armandina Gemma, MD. Schedule an appointment as soon as possible for a visit in 3 week(s).   Specialty: General Surgery Why: For wound re-check Contact information: [address]  [place] [address] [place], [place] [facility] Office: [phone]   Signed: Armandina Gemma 08/25/2022, 8:23 AM

## 2022-08-25 NOTE — Telephone Encounter (Signed)
Error

## 2022-08-25 NOTE — Progress Notes (Signed)
Patient was given discharge instructions, and all questions were answered.  Patient was stable for discharge and was taken to the main exit by wheelchair. 

## 2022-08-25 NOTE — Telephone Encounter (Signed)
Please advise K[facility] 

## 2022-08-25 NOTE — Telephone Encounter (Signed)
From: Hassell Halim To: Office of Fenton Foy, NP Sent: 08/23/2022 9:17 AM EDT Subject: Medication Renewal Request  Refills have been requested for the following medications:   diclofenac (VOLTAREN) 75 MG EC tablet [Tonya S Nichols]   DULoxetine (CYMBALTA) 20 MG capsule [Tonya S Nichols]   amLODipine (NORVASC) 5 MG tablet Kenney Houseman S Nichols]  Preferred pharmacy: [facility] [place] ([place]), [place] - [address] Delivery method: [employer]

## 2022-08-25 NOTE — Discharge Instructions (Signed)
[facility] - Dr. Ebert Forrester  THYROID & PARATHYROID SURGERY:  POST-OP INSTRUCTIONS  Always review the instruction sheet provided by the hospital nurse at discharge.  A prescription for pain medication may be sent to your pharmacy at the time of discharge.  Take your pain medication as prescribed.  If narcotic pain medicine is not needed, then you may take acetaminophen (Tylenol) or ibuprofen (Advil) as needed for pain or soreness.  Take your normal home medications as prescribed unless otherwise directed.  If you need a refill on your pain medication, please contact the office during regular business hours.  Prescriptions will not be processed by the office after 5:00PM or on weekends.  Start with a light diet upon arrival home, such as soup and crackers or toast.  Be sure to drink plenty of fluids.  Resume your normal diet the day after surgery.  Most patients will experience some swelling and bruising on the chest and neck area.  Ice packs will help for the first 48 hours after arriving home.  Swelling and bruising will take several days to resolve.   It is common to experience some constipation after surgery.  Increasing fluid intake and taking a stool softener (Colace) will usually help to prevent this problem.  A mild laxative (Milk of Magnesia or Miralax) should be taken according to package directions if there has been no bowel movement after 48 hours.  Dermabond glue covers your incision. This seals the wound and you may shower at any time. The Dermabond will remain in place for about a week.  You may gradually remove the glue when it loosens around the edges.  If you need to loosen the Dermabond for removal, apply a layer of Vaseline to the wound for 15 minutes and then remove with a Kleenex. Your sutures are under the skin and will not show - they will dissolve on their own.  You may resume light daily activities beginning the day after discharge (such as self-care,  walking, climbing stairs), gradually increasing activities as tolerated. You may have sexual intercourse when it is comfortable. Refrain from any heavy lifting or straining until approved by your doctor. You may drive when you no longer are taking prescription pain medication, you can comfortably wear a seatbelt, and you can safely maneuver your car and apply the brakes.  You will see your doctor in the office for a follow-up appointment approximately three weeks after your surgery.  Make sure that you call for this appointment within a day or two after you arrive home to insure a convenient appointment time. Please have any requested laboratory tests performed a few days prior to your office visit so that the results will be available at your follow up appointment.  WHEN TO CALL THE [facility] OFFICE: -- Fever greater than 101.5 -- Inability to urinate -- Nausea and/or vomiting - persistent -- Extreme swelling or bruising -- Continued bleeding from incision -- Increased pain, redness, or drainage from the incision -- Difficulty swallowing or breathing -- Muscle cramping or spasms -- Numbness or tingling in hands or around lips  The clinic staff is available to answer your questions during regular business hours.  Please don't hesitate to call and ask to speak to one of the nurses if you have concerns.  [facility] OFFICE: [phone] (24 hours)  Please sign up for MyChart accounts. This will allow you to communicate directly with my nurse or myself without having to call the office. It will also allow you   to view your test results. You will need to enroll in MyChart for my office ([facility]) and for the hospital ().  Jovannie Ulibarri, MD [facility] practice 

## 2022-08-26 NOTE — Progress Notes (Signed)
Good news!  Final pathology is all benign as expected.  Armandina Gemma, MD [facility] [place] practice Office: [phone]

## 2022-08-26 NOTE — Progress Notes (Signed)
Wonderful news!  Thanks so much!!

## 2022-09-07 ENCOUNTER — Ambulatory Visit: Attending: Physical Therapy | Admitting: Physical Therapy

## 2022-09-07 NOTE — Therapy (Deleted)
OUTPATIENT PHYSICAL THERAPY THORACOLUMBAR EVALUATION  Patient Name: Jennifer Forbes MRN: [id number] DOB:08-08-1970, 52 y.o., female Today's Date: 09/07/2022    Past Medical History:  Diagnosis Date   Anxiety    Arthritis    Left knee   GERD (gastroesophageal reflux disease)    Headache    Hypertension    Pneumonia    Tachycardia    Per patient   Past Surgical History:  Procedure Laterality Date   BREAST BIOPSY  2015   left breast   BREAST LUMPECTOMY WITH RADIOACTIVE SEED LOCALIZATION Left 10/01/2021   Procedure: LEFT BREAST LUMPECTOMY WITH RADIOACTIVE SEED LOCALIZATION;  Surgeon: Jovita Kussmaul, MD;  Location: [facility];  Service: General;  Laterality: Left;   LIPOMA RESECTION  march 2014   right shoulder    SKIN BIOPSY     THYROIDECTOMY N/A 08/24/2022   Procedure: TOTAL THYROIDECTOMY;  Surgeon: Armandina Gemma, MD;  Location: [facility];  Service: General;  Laterality: N/A;   Patient Active Problem List   Diagnosis Date Noted   Dysphagia 08/22/2022   Multinodular goiter (nontoxic) 01/15/2022   Abnormal EKG 05/09/2017   Tachycardia 05/09/2017   Essential hypertension 10/24/2015   Rheumatoid arthritis involving left knee 10/24/2015   Gastroesophageal reflux disease without esophagitis 10/24/2015   Tobacco dependence 10/24/2015   Morbid obesity 10/24/2015    PCP: Fenton Foy, NP  REFERRING PROVIDER: Fenton Foy, NP  THERAPY DIAG:  No diagnosis found.  REFERRING DIAG: Chronic midline low back pain with left-sided sciatica [M54.42, G89.29]   Rationale for Evaluation and Treatment:  Rehabilitation  SUBJECTIVE:  PERTINENT PAST HISTORY:  RA, thyroid surgery 3/20        PRECAUTIONS: Other: see pertinent history  WEIGHT BEARING RESTRICTIONS No  FALLS:  Has patient fallen in last 6 months? {yes/no:20286}, Number of falls: ***  MOI/History of condition:  Onset date: ***  SUBJECTIVE STATEMENT  Jennifer Forbes is a 52 y.o. female who presents to clinic with  chief complaint of ***.  ***  From referring provider:   "Patient states that she continues to have low back pain which has been a chronic issue for several years. She does have lumbar spine degenerative changes upon previous x-ray. We will refer patient to Ortho for further evaluation and treatment we will refer patient for physical therapy."   Red flags:  {has/denies:26543} {kerredflag:26542}  Pain:  Are you having pain? {yes/no:20286} Pain location: *** NPRS scale:  {NUMBERS; 0-10:5044}/10 to {NUMBERS; 0-10:5044}/10 Aggravating factors: *** Relieving factors: *** Pain description: {PAIN DESCRIPTION:21022940} Stage: {Desc; acute/subacute/chronic:13799} Stability: {kerbetterworse:26715} 24 hour pattern: ***   Occupation: ***  Assistive Device: ***  Hand Dominance: ***  Patient Goals/Specific Activities: ***   OBJECTIVE:   DIAGNOSTIC FINDINGS:  ***  GENERAL OBSERVATION/GAIT:  ***  SENSATION:  Light touch: {intact/deficits:24005}  LUMBAR AROM  AROM AROM  09/07/2022  Flexion {kerromlxflex:28296}  Extension {kerromcxlx:26716}  Right lateral flexion {kerromcxlx:26716}  Left lateral flexion {kerromcxlx:26716}  Right rotation {kerromcxlx:26716}  Left rotation {kerromcxlx:26716}    (Blank rows = not tested)   LE MMT:  MMT Right 09/07/2022 Left 09/07/2022  Hip flexion (L2, L3) *** ***  Knee extension (L3) *** ***  Knee flexion *** ***  Hip abduction *** ***  Hip extension *** ***  Hip external rotation    Hip internal rotation    Hip adduction    Ankle dorsiflexion (L4)    Ankle plantarflexion (S1)    Ankle inversion    Ankle eversion    [employer]  Toe ext (L5)    Grossly     (Blank rows = not tested, score listed is out of 5 possible points.  N = WNL, D = diminished, C = clear for gross weakness with myotome testing, * = concordant pain with testing)   LUMBAR SPECIAL TESTS:  Straight leg raise: L (***), R (***) Slump: L (***), R (***)  MUSCLE  LENGTH: Hamstrings: Right {kerminsig:27227} restriction; Left {kerminsig:27227} restriction ASLR: Right {keraslr:27228}; Left {keraslr:27228} Marcello Moores test: Right {kerminsig:27227} restriction; Left {kerminsig:27227} restriction Ely's test: Right {kerminsig:27227} restriction; Left {kerminsig:27227} restriction  LE ROM:  ROM Right 09/07/2022 Left 09/07/2022  Hip flexion    Hip extension    Hip abduction    Hip adduction    Hip internal rotation    Hip external rotation    Knee flexion    Knee extension    Ankle dorsiflexion    Ankle plantarflexion    Ankle inversion    Ankle eversion      (Blank rows = not tested, N = WNL, * = concordant pain with testing)  Functional Tests  Eval (09/07/2022)                                                                PALPATION:   ***  PATIENT SURVEYS:  {rehab surveys:24030}   TODAY'S TREATMENT  ***  PATIENT EDUCATION:  POC, diagnosis, prognosis, HEP, and outcome measures.  Pt educated via explanation, demonstration, and handout (HEP).  Pt confirms understanding verbally.   HOME EXERCISE PROGRAM: ***  Treatment priorities   Eval (09/07/2022)                                                  ASSESSMENT:  CLINICAL IMPRESSION: Jennifer Forbes is a 52 y.o. female who presents to clinic with signs and sxs consistent with ***.    OBJECTIVE IMPAIRMENTS: Pain, ***  ACTIVITY LIMITATIONS: ***  PERSONAL FACTORS: See medical history and pertinent history   REHAB POTENTIAL: {rehabpotential:25112}  CLINICAL DECISION MAKING: {clinical decision making:25114}  EVALUATION COMPLEXITY: {Evaluation complexity:25115}   GOALS:   SHORT TERM GOALS: Target date: 10/05/2022  Jennifer Forbes will be >75% HEP compliant to improve carryover between sessions and facilitate independent management of condition  Evaluation (09/07/2022): ongoing Goal status: INITIAL   LONG TERM GOALS: Target date: 11/02/2022  ***   2.  ***   3.   ***   4.  ***   5.  ***   6.  ***   PLAN: PT FREQUENCY: 1-2x/week  PT DURATION: 8 weeks (Ending 11/02/2022)  PLANNED INTERVENTIONS: Therapeutic exercises, Aquatic therapy, Therapeutic activity, Neuro Muscular re-education, Gait training, Patient/Family education, Joint mobilization, Dry Needling, Electrical stimulation, Spinal mobilization and/or manipulation, Moist heat, Taping, Vasopneumatic device, Ionotophoresis 4mg /ml Dexamethasone, and Manual therapy  PLAN FOR NEXT SESSION: ***   Shearon Balo PT, DPT 09/07/2022, 9:16 AM

## 2022-09-08 ENCOUNTER — Ambulatory Visit: Admitting: Orthopedic Surgery

## 2022-09-13 ENCOUNTER — Ambulatory Visit: Admitting: Nurse Practitioner

## 2022-09-13 DIAGNOSIS — R768 Other specified abnormal immunological findings in serum: Secondary | ICD-10-CM

## 2022-09-13 DIAGNOSIS — E042 Nontoxic multinodular goiter: Secondary | ICD-10-CM

## 2022-09-13 DIAGNOSIS — E890 Postprocedural hypothyroidism: Secondary | ICD-10-CM

## 2022-09-14 LAB — T3, FREE: T3, Free: 1.3 pg/mL — ABNORMAL LOW (ref 2.0–4.4)

## 2022-09-14 LAB — T4, FREE: Free T4: 0.54 ng/dL — ABNORMAL LOW (ref 0.82–1.77)

## 2022-09-14 LAB — TSH: TSH: 19 u[IU]/mL — ABNORMAL HIGH (ref 0.450–4.500)

## 2022-09-15 NOTE — Patient Instructions (Signed)

## 2022-09-17 ENCOUNTER — Encounter: Payer: Self-pay | Admitting: Nurse Practitioner

## 2022-09-17 ENCOUNTER — Ambulatory Visit (INDEPENDENT_AMBULATORY_CARE_PROVIDER_SITE_OTHER): Admitting: Nurse Practitioner

## 2022-09-17 VITALS — BP 113/78 | HR 92 | Ht 68.0 in | Wt 193.4 lb

## 2022-09-17 DIAGNOSIS — E890 Postprocedural hypothyroidism: Secondary | ICD-10-CM | POA: Diagnosis not present

## 2022-09-17 MED ORDER — SYNTHROID 100 MCG PO TABS: 100.0000 ug | ORAL_TABLET | Freq: Every day | ORAL | 1 refills | Status: DC

## 2022-09-17 NOTE — Progress Notes (Signed)
Endocrinology Follow Up Note 09/17/22    ---------------------------------------------------------------------------------------------------------------------- Subjective    Past Medical History:  Diagnosis Date   Anxiety    Arthritis    Left knee   GERD (gastroesophageal reflux disease)    Headache    Hypertension    Pneumonia    Tachycardia    Per patient    Past Surgical History:  Procedure Laterality Date   BREAST BIOPSY  2015   left breast   BREAST LUMPECTOMY WITH RADIOACTIVE SEED LOCALIZATION Left 10/01/2021   Procedure: LEFT BREAST LUMPECTOMY WITH RADIOACTIVE SEED LOCALIZATION;  Surgeon: Griselda Miner, MD;  Location: [facility] OR;  Service: General;  Laterality: Left;   LIPOMA RESECTION  march 2014   right shoulder    SKIN BIOPSY     THYROIDECTOMY N/A 08/24/2022   Procedure: TOTAL THYROIDECTOMY;  Surgeon: Darnell Level, MD;  Location: [facility] ORS;  Service: General;  Laterality: N/A;    Social History   Socioeconomic History   Marital status: Legally Separated    Spouse name: Not on file   Number of children: 1   Years of education: Not on file   Highest education level: Associate degree: occupational, Scientist, product/process development, or vocational program  Occupational History   Not on file  Tobacco Use   Smoking status: Every Day    Types: Cigars   Smokeless tobacco: Never   Tobacco comments:    black and mild smokes about 3 per day  Vaping Use   Vaping Use: Never used  Substance and Sexual Activity   Alcohol use: No   Drug use: Not Currently   Sexual activity: Yes    Birth control/protection: None  Other Topics Concern   Not on file  Social History Narrative   Not on file   Social Determinants of Health   Financial Resource Strain: Not on file  Food Insecurity: No Food Insecurity (07/14/2021)   Hunger Vital Sign    Worried About Running Out of Food in the Last Year: Never true    Ran Out of Food in the Last Year: Never true  Transportation Needs: No Transportation Needs  (07/14/2021)   PRAPARE - Administrator, Civil Service (Medical): No    Lack of Transportation (Non-Medical): No  Physical Activity: Not on file  Stress: Not on file  Social Connections: Not on file  Intimate Partner Violence: Not on file    Current Outpatient Medications on File Prior to Visit  Medication Sig Dispense Refill   amLODipine (NORVASC) 5 MG tablet Take 1 tablet (5 mg total) by mouth daily. 90 tablet 0   Aspirin-Salicylamide-Caffeine (BC HEADACHE POWDER PO) Take 1-2 packets by mouth every 4 (four) hours as needed (migraines).     calcium carbonate (TUMS) 500 MG chewable tablet Chew 2 tablets (400 mg of elemental calcium total) by mouth 2 (two) times daily. 90 tablet 1   diclofenac (VOLTAREN) 75 MG EC tablet Take 1 tablet (75 mg total) by mouth 2 (two) times daily. 30 tablet 0   DULoxetine (CYMBALTA) 20 MG capsule Take 1 capsule (20 mg total) by mouth daily. 30 capsule 0   metoprolol succinate (TOPROL-XL) 25 MG 24 hr tablet Take 0.5 tablets (12.5 mg total) by mouth daily. 45 tablet 0   pantoprazole (PROTONIX) 40 MG tablet Take 1 tablet (40 mg total) by mouth daily. 90 tablet 3   traMADol (ULTRAM) 50 MG tablet Take 1 tablet (50 mg total) by mouth every 6 (six) hours as needed for moderate pain. 15  tablet 0   cetirizine (ZYRTEC) 10 MG tablet Take 1 tablet (10 mg total) by mouth daily. (Patient not taking: Reported on 05/13/2022) 30 tablet 11   fluticasone (FLONASE) 50 MCG/ACT nasal spray Place 2 sprays into both nostrils daily. (Patient not taking: Reported on 05/13/2022) 16 g 6   ondansetron (ZOFRAN-ODT) 4 MG disintegrating tablet Take 1 tablet (4 mg total) by mouth every 8 (eight) hours as needed for nausea or vomiting. (Patient not taking: Reported on 09/17/2022) 20 tablet 0   No current facility-administered medications on file prior to visit.      HPI   DELTA PICHON is a 52 y.o.-year-old female, referred by her PCP, Dr.Nichols, for evaluation for multinodular  goiter.  Thyroid U/S: 11/28/19 CLINICAL DATA:  Incidental on CT.  Thyromegaly on chest CT.   EXAM: THYROID ULTRASOUND   TECHNIQUE: Ultrasound examination of the thyroid gland and adjacent soft tissues was performed.   COMPARISON:  Chest CT-11/08/2019   FINDINGS: Parenchymal Echotexture: Mildly heterogenous   Isthmus: Normal in size measuring 0.5 cm in diameter   Right lobe: Enlarged measuring 6.9 x 2.6 x 2.1 cm   Left lobe: Enlarged measuring 7.2 x 2.3 x 2.3 cm   _________________________________________________________   Estimated total number of nodules >/= 1 cm: 6-10   Number of spongiform nodules >/=  2 cm not described below (TR1): 0   Number of mixed cystic and solid nodules >/= 1.5 cm not described below (TR2): 0   _________________________________________________________   Nodule # 1:   Location: Right; Mid   Maximum size: 2.0 cm; Other 2 dimensions: 1.8 x 1.2 cm   Composition: solid/almost completely solid (2)   Echogenicity: isoechoic (1)   Shape: not taller-than-wide (0)   Margins: ill-defined (0)   Echogenic foci: none (0)   ACR TI-RADS total points: 3.   ACR TI-RADS risk category: TR3 (3 points).   ACR TI-RADS recommendations:   *Given size (>/= 1.5 - 2.4 cm) and appearance, a follow-up ultrasound in 1 year should be considered based on TI-RADS criteria.   _________________________________________________________   Nodule # 2:   Location: Right; Mid   Maximum size: 2.7 cm; Other 2 dimensions: 2.5 x 1.6 cm   Composition: solid/almost completely solid (2)   Echogenicity: isoechoic (1)   Shape: not taller-than-wide (0)   Margins: ill-defined (0)   Echogenic foci: none (0)   ACR TI-RADS total points: 3.   ACR TI-RADS risk category: TR3 (3 points).   ACR TI-RADS recommendations:   **Given size (>/= 2.5 cm) and appearance, fine needle aspiration of this mildly suspicious nodule should be considered based on TI-RADS criteria.    _________________________________________________________   Nodule # 3:   Location: Right; Mid   Maximum size: 1.7 cm; Other 2 dimensions: 1.5 x 0.9 cm   Composition: solid/almost completely solid (2)   Echogenicity: isoechoic (1)   Shape: not taller-than-wide (0)   Margins: ill-defined (0)   Echogenic foci: none (0)   ACR TI-RADS total points: 3.   ACR TI-RADS risk category: TR3 (3 points).   ACR TI-RADS recommendations:   *Given size (>/= 1.5 - 2.4 cm) and appearance, a follow-up ultrasound in 1 year should be considered based on TI-RADS criteria.   _________________________________________________________   _________________________________________________________   Nodule # 4:   Location: Left; Inferior   Maximum size: 3.7 cm; Other 2 dimensions: 3.1 x 2.3 cm   Composition: solid/almost completely solid (2)   Echogenicity: isoechoic (1)   Shape: not taller-than-wide (  0)   Margins: ill-defined (0)   Echogenic foci: none (0)   ACR TI-RADS total points: 3.   ACR TI-RADS risk category: TR3 (3 points).   ACR TI-RADS recommendations:   **Given size (>/= 2.5 cm) and appearance, fine needle aspiration of this mildly suspicious nodule should be considered based on TI-RADS criteria.   _________________________________________________________   There is an approximately 1.4 x 1.2 x 1.0 cm isoechoic ill-defined nodule/pseudonodule involving the mid, posterior aspect the right lobe of the thyroid (labeled 5), which does not meet criteria to recommend percutaneous sampling or continued dedicated follow-up   _________________________________________________________   Nodule # 6:   Location: Left; Superior   Maximum size: 2.1 cm; Other 2 dimensions: 1.8 x 1.1 cm   Composition: solid/almost completely solid (2)   Echogenicity: isoechoic (1)   Shape: not taller-than-wide (0)   Margins: ill-defined (0)   Echogenic foci: none (0)   ACR TI-RADS total  points: 3.   ACR TI-RADS risk category: TR3 (3 points).   ACR TI-RADS recommendations:   *Given size (>/= 1.5 - 2.4 cm) and appearance, a follow-up ultrasound in 1 year should be considered based on TI-RADS criteria.   _________________________________________________________   Ronnald Nian approximately 2.0 x 1.9 x 0.9 cm isoechoic nodule within the inferior pole the left lobe of the thyroid (labeled 7) is favored to represent a pseudonodule as it lacks defined borders on both provided transverse and sagittal images.   IMPRESSION: 1. Thyromegaly with findings suggestive of multinodular goiter. 2. Nodules #2 and #4 both meet imaging criteria to recommend percutaneous sampling as indicated. 3. Nodules #1, #3 and #6 all meet imaging criteria to recommend a 1 year follow-up as indicated.   The above is in keeping with the ACR TI-RADS recommendations - J Am Coll Radiol 2017;14:587-595.     Electronically Signed   By: Simonne Come M.D.   On: 11/28/2019 12:42   I reviewed pt's thyroid tests: Lab Results  Component Value Date   TSH 19.000 (H) 09/13/2022   TSH 1.092 06/03/2022   TSH 0.869 01/15/2022   TSH 1.660 10/03/2019   FREET4 0.54 (L) 09/13/2022   FREET4 0.70 06/03/2022     No FH of thyroid ds. No FH of thyroid cancer. No h/o radiation tx to head or neck.  Mom did have parathyroid gland removed.  No seaweed or kelp. No recent contrast studies. No steroid use. No herbal supplements. No Biotin supplements or Hair, Skin and Nails vitamins.   Review of systems  Constitutional: + Minimally fluctuating body weight,  current Body mass index is 29.41 kg/m. , no fatigue, no subjective hyperthermia, no subjective hypothermia Eyes: no blurry vision, no xerophthalmia ENT: no sore throat, no nodules palpated in throat, no dysphagia/odynophagia, + hoarseness since surgery, reports PND Cardiovascular: no chest pain, no shortness of breath, no palpitations, no leg  swelling Respiratory: + productive cough (clear phlegm), no shortness of breath Gastrointestinal: + nausea and vomiting after taking levothyroxine in the morning Musculoskeletal: no muscle/joint aches Skin: no rashes, no hyperemia Neurological: no tremors, no numbness, no tingling, no dizziness Psychiatric: no depression, no anxiety  ---------------------------------------------------------------------------------------------------------------------- Objective    BP 113/78 (BP Location: Right Arm, Patient Position: Sitting, Cuff Size: Large)   Pulse 92   Ht 5\' 8"  (1.727 m)   Wt 193 lb 6.4 oz (87.7 kg)   LMP 01/20/2019   BMI 29.41 kg/m    BP Readings from Last 3 Encounters:  09/17/22 113/78  08/25/22 (!) 160/99  08/19/22  136/88    Wt Readings from Last 3 Encounters:  09/17/22 193 lb 6.4 oz (87.7 kg)  08/24/22 201 lb (91.2 kg)  08/19/22 201 lb (91.2 kg)     Physical Exam- Limited  Constitutional:  Body mass index is 29.41 kg/m. , not in acute distress, normal state of mind Eyes:  EOMI, no exophthalmos Musculoskeletal: no gross deformities, strength intact in all four extremities, no gross restriction of joint movements Skin:  no rashes, no hyperemia Neurological: no tremor with outstretched hands   Thyroid US from 06/03/22  CLINICAL DATA:  Goiter.   EXAM: THYROID ULTRASOUND   TECHNIQUE: Ultrasound examination of the thyroid gland and adjacent soft tissues was performed.   COMPARISON:  Prior thyroid ultrasound 11/28/2019   FINDINGS: Parenchymal Echotexture: Moderately heterogenous   Isthmus: 0.6 cm   Right lobe: 6.4 x 2.2 x 3.1 cm   Left lobe: 8.2 x 3.2 x 2.9 cm   _________________________________________________________   Estimated total number of nodules >/= 1 cm: 5   Number of spongiform nodules >/=  2 cm not described below (TR1): 0   Number of mixed cystic and solid nodules >/= 1.5 cm not described below (TR2): 0    _________________________________________________________   Nodule # 1:   Prior biopsy: No   Location: Right; Mid   Maximum size: 3.4 cm; Other 2 dimensions: 2.1 x 2.5 cm, previously, 2.5 x 1.6 x 2.7 cm   Composition: solid/almost completely solid (2)   Echogenicity: isoechoic (1)   Shape: not taller-than-wide (0)   Margins: ill-defined (0)   Echogenic foci: none (0)   ACR TI-RADS total points: 3.   ACR TI-RADS risk category:  TR3 (3 points).   Significant change in size (>/= 20% in two dimensions and minimal increase of 2 mm): Yes   Change in features: No   Change in ACR TI-RADS risk category: No   ACR TI-RADS recommendations:   **Given size (>/= 2.5 cm) and appearance, fine needle aspiration of this mildly suspicious nodule should be considered based on TI-RADS criteria.   _________________________________________________________   Nodule # 2:   Prior biopsy: No   Location: Right; Superior   Maximum size: 2.1 cm; Other 2 dimensions: 1.3 x 1.2 cm, previously, 1.6 x 0.9 x 1.5 cm   Composition: solid/almost completely solid (2)   Echogenicity: isoechoic (1)   Shape: not taller-than-wide (0)   Margins: ill-defined (0)   Echogenic foci: none (0)   ACR TI-RADS total points: 3.   ACR TI-RADS risk category:  TR3 (3 points).   Significant change in size (>/= 20% in two dimensions and minimal increase of 2 mm): Yes   Change in features: No   Change in ACR TI-RADS risk category: No   ACR TI-RADS recommendations:   *Given size (>/= 1.5 - 2.4 cm) and appearance, a follow-up ultrasound in 1 year should be considered based on TI-RADS criteria.   _________________________________________________________   Nodule # 1 LEFT:   Prior biopsy: No   Location: Left; Mid   Maximum size: 3.3 cm; Other 2 dimensions: 1.7 x 2.5 cm, previously, 3.6 x 2.3 x 3.1 cm   Composition: solid/almost completely solid (2)   Echogenicity: isoechoic (1)   Shape: not  taller-than-wide (0)   Margins: ill-defined (0)   Echogenic foci: none (0)   ACR TI-RADS total points: 3.   ACR TI-RADS risk category:  TR3 (3 points).   Significant change in size (>/= 20% in two dimensions and minimal increase of 2  mm): Yes   Change in features: No   Change in ACR TI-RADS risk category: No   ACR TI-RADS recommendations:   **Given size (>/= 2.5 cm) and appearance, fine needle aspiration of this mildly suspicious nodule should be considered based on TI-RADS criteria.   _________________________________________________________   Nodule # 2 LEFT:   Prior biopsy: No   Location: Left; Superior   Maximum size: 2.4 cm; Other 2 dimensions: 1.3 x 2.4 cm, previously, 2.1 x 1.4 x 1.7 cm   Composition: solid/almost completely solid (2)   Echogenicity: isoechoic (1)   Shape: not taller-than-wide (0)   Margins: ill-defined (0)   Echogenic foci: none (0)   ACR TI-RADS total points: 3.   ACR TI-RADS risk category:  TR3 (3 points).   Significant change in size (>/= 20% in two dimensions and minimal increase of 2 mm): Yes   Change in features: No   Change in ACR TI-RADS risk category: No   ACR TI-RADS recommendations:   *Given size (>/= 1.5 - 2.4 cm) and appearance, a follow-up ultrasound in 1 year should be considered based on TI-RADS criteria.   _________________________________________________________   Nodule # 3 LEFT:   Prior biopsy: No   Location: Left; Inferior   Maximum size: 2.1 cm; Other 2 dimensions: 2.1 x 1.7 cm, previously, 2.0 x 1.9 x 0.9 cm   Composition: solid/almost completely solid (2)   Echogenicity: isoechoic (1)   Shape: not taller-than-wide (0)   Margins: ill-defined (0)   Echogenic foci: none (0)   ACR TI-RADS total points: 3.   ACR TI-RADS risk category:  TR3 (3 points).   Significant change in size (>/= 20% in two dimensions and minimal increase of 2 mm): No   Change in features: No   Change in ACR  TI-RADS risk category: No   ACR TI-RADS recommendations:   *Given size (>/= 1.5 - 2.4 cm) and appearance, a follow-up ultrasound in 1 year should be considered based on TI-RADS criteria.   _________________________________________________________   IMPRESSION: 1. Enlarging TI-RADS category 3 nodule in the right mid gland (labeled #1 above) continues to meet criteria to consider fine-needle aspiration biopsy. 2. TI-RADS category 3 nodule in the left mid gland (labeled # 1 LEFT) is slightly smaller in size but continues to meet criteria to consider fine-needle aspiration biopsy. 3. Additional nodules as above demonstrates slow interval growth and continue to meet criteria for imaging surveillance. Recommend follow-up ultrasound in 1-2 years.   The above is in keeping with the ACR TI-RADS recommendations - J Am Coll Radiol 2017;14:587-595.     Electronically Signed   By: Malachy Moan M.D.   On: 06/05/2022 10:27     Cytology from FNA on 06/30/22 CYTOLOGY - NON PAP CASE: [id number] PATIENT: Jennifer Forbes Non-Gynecological Cytology Report     Clinical History: None provided Specimen Submitted:  B. THYROID, LEFT, FINE NEEDLE ASPIRATION:   FINAL MICROSCOPIC DIAGNOSIS: - Consistent with benign follicular nodule ([place]hesda category II)  SPECIMEN ADEQUACY: Satisfactory for evaluation  GROSS: Received is/are (B)(1)2 Slides ( 1 quick stain). (2)2 Slides  (1quick stain). (3)2 Slides (1quick stain). Also received 5cc's of clear colorless saline solution from needle rinses and 6 slides in ethyl alcohol. (BEE:TB:tb) Smears: (B)6 Concentration Method (Thin Prep):(B)1 Cell Block: (B) Cell block attempted, but not obtained Additional Studies: Afirma collected     Final Diagnosis performed by Jimmy Picket, MD.   Electronically signed 07/02/2022 Technical and / or Professional components performed at [facility]. [facility], [address] [place], [place]  [address].  Immunohistochemistry Technical component (if applicable) was performed at [facility]. [address], [place], [place] [address].   IMMUNOHISTOCHEMISTRY DISCLAIMER (if applicable): Some of these immunohistochemical stains may have been developed and the performance characteristics determine by [facility]. Some may not have been cleared or approved by the U.S. Food and Drug Administration. The FDA has determined that such clearance or approval is not necessary. This test is used for clinical purposes. It should not be regarded as investigational or for research. This laboratory is certified under the Clinical Laboratory Improvement Amendments of 1988 (CLIA-88) as qualified to perform high complexity clinical laboratory testing.  The controls stained appropriately.      Latest Reference Range & Units 10/03/19 10:01 01/15/22 10:08 06/03/22 14:19 09/13/22 09:27  TSH 0.450 - 4.500 uIU/mL 1.660 0.869 1.092 19.000 (H)  Triiodothyronine,Free,Serum 2.0 - 4.4 pg/mL   2.4 1.3 (L)  T4,Free(Direct) 0.82 - 1.77 ng/dL   0.67 7.03 (L)  Thyroglobulin ng/mL   160 (H)   Thyroperoxidase Ab SerPl-aCnc 0 - 34 IU/mL   11   (H): Data is abnormally high (L): Data is abnormally low   ----------------------------------------------------------------------------------------------------------------------  ASSESSMENT / PLAN:  1. Postoperative hypothyroidism  - She had her thyroid FNA on 06/30/22 and results were found to be benign.  She did have significant swelling post FNA and temporary worsening of her already significant compression symptoms.  She is s/p total thyroidectomy for MNG with significant compression symptoms on 08/24/22.  Since surgery, she has no voice, and notices nausea after taking her thyroid hormone.  This is significant enough to where she does vomit (has not noticed the pill in her emesis).  There is suspicion that she may not be tolerating  the generic Levothyroxine.  Will try switching her to branded Synthroid 100 mcg po daily before breakfast (can take it when she gets up to use the bathroom during the night which may help reduce nausea).   - The correct intake of thyroid hormone (Levothyroxine, Synthroid), is on empty stomach first thing in the morning, with water, separated by at least 30 minutes from breakfast and other medications,  and separated by more than 4 hours from calcium, iron, multivitamins, acid reflux medications (PPIs).  - This medication is a life-long medication and will be needed to correct thyroid hormone imbalances for the rest of your life.  The dose may change from time to time, based on thyroid blood work.  - It is extremely important to be consistent taking this medication, near the same time each morning.  -AVOID TAKING PRODUCTS CONTAINING BIOTIN (commonly found in Hair, Skin, Nails vitamins) AS IT INTERFERES WITH THE VALIDITY OF THYROID FUNCTION BLOOD TESTS.  FOLLOW UP PLAN:  Return in about 2 months (around 11/17/2022) for Thyroid follow up, Previsit labs.        I spent  27  minutes in the care of the patient today including review of labs from Thyroid Function, CMP, and other relevant labs ; imaging/biopsy records (current and previous including abstractions from other facilities); face-to-face time discussing  her lab results and symptoms, medications doses, her options of short and long term treatment based on the latest standards of care / guidelines;   and documenting the encounter.  [name]  participated in the discussions, expressed understanding, and voiced agreement with the above plans.  All questions were answered to her satisfaction. she is encouraged to contact clinic  should she have any questions or concerns prior to her return visit.    Ronny Bacon, [facility] [address] [place], [place] [address] Phone: [phone] Fax:  [phone]

## 2022-09-21 ENCOUNTER — Ambulatory Visit: Admitting: Gastroenterology

## 2022-09-21 ENCOUNTER — Other Ambulatory Visit: Payer: Self-pay | Admitting: Nurse Practitioner

## 2022-09-21 DIAGNOSIS — R000 Tachycardia, unspecified: Secondary | ICD-10-CM

## 2022-09-21 DIAGNOSIS — E890 Postprocedural hypothyroidism: Secondary | ICD-10-CM | POA: Insufficient documentation

## 2022-09-24 ENCOUNTER — Other Ambulatory Visit: Payer: Self-pay

## 2022-09-24 DIAGNOSIS — R32 Unspecified urinary incontinence: Secondary | ICD-10-CM | POA: Diagnosis not present

## 2022-09-24 DIAGNOSIS — I10 Essential (primary) hypertension: Secondary | ICD-10-CM

## 2022-09-24 DIAGNOSIS — F32A Depression, unspecified: Secondary | ICD-10-CM

## 2022-09-24 MED ORDER — DULOXETINE HCL 20 MG PO CPEP: 20.0000 mg | ORAL_CAPSULE | Freq: Every day | ORAL | 0 refills | Status: DC

## 2022-09-24 MED ORDER — AMLODIPINE BESYLATE 5 MG PO TABS: 5.0000 mg | ORAL_TABLET | Freq: Every day | ORAL | 0 refills | Status: DC

## 2022-09-24 NOTE — Telephone Encounter (Signed)
Please advise K[facility] 

## 2022-10-13 ENCOUNTER — Ambulatory Visit: Admitting: Nurse Practitioner

## 2022-10-25 DIAGNOSIS — R32 Unspecified urinary incontinence: Secondary | ICD-10-CM | POA: Diagnosis not present

## 2022-11-04 ENCOUNTER — Ambulatory Visit (INDEPENDENT_AMBULATORY_CARE_PROVIDER_SITE_OTHER): Admitting: Nurse Practitioner

## 2022-11-04 ENCOUNTER — Encounter: Payer: Self-pay | Admitting: Neurology

## 2022-11-04 VITALS — BP 120/72 | HR 70 | Temp 97.9°F | Ht 68.0 in | Wt 202.0 lb

## 2022-11-04 DIAGNOSIS — G8929 Other chronic pain: Secondary | ICD-10-CM | POA: Diagnosis not present

## 2022-11-04 DIAGNOSIS — R42 Dizziness and giddiness: Secondary | ICD-10-CM | POA: Diagnosis not present

## 2022-11-04 DIAGNOSIS — R519 Headache, unspecified: Secondary | ICD-10-CM | POA: Diagnosis not present

## 2022-11-04 DIAGNOSIS — R0683 Snoring: Secondary | ICD-10-CM | POA: Insufficient documentation

## 2022-11-04 DIAGNOSIS — R110 Nausea: Secondary | ICD-10-CM

## 2022-11-04 MED ORDER — CICLOPIROX OLAMINE 0.77 % EX CREA: TOPICAL_CREAM | Freq: Two times a day (BID) | CUTANEOUS | 0 refills | Status: DC

## 2022-11-04 MED ORDER — ONDANSETRON 4 MG PO TBDP: 4.0000 mg | ORAL_TABLET | Freq: Three times a day (TID) | ORAL | 0 refills | Status: DC | PRN

## 2022-11-04 MED ORDER — MECLIZINE HCL 12.5 MG PO TABS: 12.5000 mg | ORAL_TABLET | Freq: Three times a day (TID) | ORAL | 0 refills | Status: DC | PRN

## 2022-11-04 NOTE — Progress Notes (Signed)
@Patient  ID: Jennifer Forbes, female    DOB: 21-Aug-1970, 52 y.o.   MRN: [id number]  Chief Complaint  Patient presents with   Back Pain   Dizziness    Fungus both feet.    Snoring    Referring provider: Ivonne Andrew, NP   HPI  Patient presents today for acute visit.  She states that she is having back pain but she was seen by Ortho yesterday and had x-ray completed.  She will need to have back surgery.  She is scheduled for follow-up with orthopedics in June.  Patient also complains today of toenail fungus.  We will order cream for this.  Patient also complains of dizziness that has been on and off for about a year.  She states that she does notice this more with position change.  She does notice at times she has associated headaches.  This has been chronic for the past year.  We will order meclizine and place referral for patient to neurology for further evaluation.  Patient is also requesting referral to pulmonary for sleep study.  She states that she is a heavy snorer at night and wakes up multiple times during the night.  Denies f/c/s, n/v/d, hemoptysis, PND, leg swelling Denies chest pain or edema      Allergies  Allergen Reactions   Amoxicillin Rash    Immunization History  Administered Date(s) Administered   Influenza,inj,Quad PF,6+ Mos 02/14/2019   Pneumococcal Polysaccharide-23 10/22/2015   Tdap 06/22/2016    Past Medical History:  Diagnosis Date   Anxiety    Arthritis    Left knee   GERD (gastroesophageal reflux disease)    Headache    Hypertension    Pneumonia    Tachycardia    Per patient    Tobacco History: Social History   Tobacco Use  Smoking Status Every Day   Types: Cigars  Smokeless Tobacco Never  Tobacco Comments   black and mild smokes about 3 per day   Ready to quit: Not Answered Counseling given: Not Answered Tobacco comments: black and mild smokes about 3 per day   Outpatient Encounter Medications as of 11/04/2022   Medication Sig   amLODipine (NORVASC) 5 MG tablet Take 1 tablet (5 mg total) by mouth daily.   Aspirin-Salicylamide-Caffeine (BC HEADACHE POWDER PO) Take 1-2 packets by mouth every 4 (four) hours as needed (migraines).   calcium carbonate (TUMS) 500 MG chewable tablet Chew 2 tablets (400 mg of elemental calcium total) by mouth 2 (two) times daily.   ciclopirox (LOPROX) 0.77 % cream Apply topically 2 (two) times daily.   diclofenac (VOLTAREN) 75 MG EC tablet Take 1 tablet (75 mg total) by mouth 2 (two) times daily.   DULoxetine (CYMBALTA) 20 MG capsule Take 1 capsule (20 mg total) by mouth daily.   meclizine (ANTIVERT) 12.5 MG tablet Take 1 tablet (12.5 mg total) by mouth 3 (three) times daily as needed for dizziness.   metoprolol succinate (TOPROL-XL) 25 MG 24 hr tablet Take 1/2 (one-half) tablet by mouth once daily   pantoprazole (PROTONIX) 40 MG tablet Take 1 tablet (40 mg total) by mouth daily.   SYNTHROID 100 MCG tablet Take 1 tablet (100 mcg total) by mouth daily before breakfast.   cetirizine (ZYRTEC) 10 MG tablet Take 1 tablet (10 mg total) by mouth daily. (Patient not taking: Reported on 05/13/2022)   fluticasone (FLONASE) 50 MCG/ACT nasal spray Place 2 sprays into both nostrils daily. (Patient not taking: Reported on 05/13/2022)  ondansetron (ZOFRAN-ODT) 4 MG disintegrating tablet Take 1 tablet (4 mg total) by mouth every 8 (eight) hours as needed for nausea or vomiting.   traMADol (ULTRAM) 50 MG tablet Take 1 tablet (50 mg total) by mouth every 6 (six) hours as needed for moderate pain. (Patient not taking: Reported on 11/04/2022)   [DISCONTINUED] ondansetron (ZOFRAN-ODT) 4 MG disintegrating tablet Take 1 tablet (4 mg total) by mouth every 8 (eight) hours as needed for nausea or vomiting. (Patient not taking: Reported on 09/17/2022)   No facility-administered encounter medications on file as of 11/04/2022.     Review of Systems  Review of Systems  Constitutional: Negative.   HENT:  Negative.    Cardiovascular: Negative.   Gastrointestinal: Negative.   Musculoskeletal:  Positive for back pain.  Skin:        Nail fungus  Allergic/Immunologic: Negative.   Neurological:  Positive for dizziness and headaches.  Psychiatric/Behavioral: Negative.         Physical Exam  BP 120/72   Pulse 70   Temp 97.9 F (36.6 C)   Ht 5\' 8"  (1.727 m)   Wt 202 lb (91.6 kg)   LMP 01/20/2019   SpO2 100%   BMI 30.71 kg/m   Wt Readings from Last 5 Encounters:  11/04/22 202 lb (91.6 kg)  09/17/22 193 lb 6.4 oz (87.7 kg)  08/24/22 201 lb (91.2 kg)  08/19/22 201 lb (91.2 kg)  07/27/22 207 lb (93.9 kg)     Physical Exam Vitals and nursing note reviewed.  Constitutional:      General: She is not in acute distress.    Appearance: She is well-developed.  Cardiovascular:     Rate and Rhythm: Normal rate and regular rhythm.  Pulmonary:     Effort: Pulmonary effort is normal.     Breath sounds: Normal breath sounds.  Musculoskeletal:     Lumbar back: Tenderness present.  Neurological:     Mental Status: She is alert and oriented to person, place, and time.      Lab Results:  CBC    Component Value Date/Time   WBC 3.6 (L) 08/19/2022 1024   RBC 4.24 08/19/2022 1024   HGB 13.8 08/19/2022 1024   HGB 15.4 01/15/2022 1008   HCT 42.5 08/19/2022 1024   HCT 44.7 01/15/2022 1008   PLT 242 08/19/2022 1024   PLT 311 01/15/2022 1008   MCV 100.2 (H) 08/19/2022 1024   MCV 93 01/15/2022 1008   MCH 32.5 08/19/2022 1024   MCHC 32.5 08/19/2022 1024   RDW 14.9 08/19/2022 1024   RDW 13.0 01/15/2022 1008   LYMPHSABS 1.7 11/08/2019 1701   LYMPHSABS 2.2 10/03/2019 1001   MONOABS 1.3 (H) 11/08/2019 1701   EOSABS 0.3 11/08/2019 1701   EOSABS 0.2 10/03/2019 1001   BASOSABS 0.1 11/08/2019 1701   BASOSABS 0.1 10/03/2019 1001    BMET    Component Value Date/Time   NA 140 08/19/2022 1024   NA 140 01/15/2022 1008   K 3.5 08/19/2022 1024   CL 108 08/19/2022 1024   CO2 23  08/19/2022 1024   GLUCOSE 77 08/19/2022 1024   BUN 14 08/19/2022 1024   BUN 10 01/15/2022 1008   CREATININE 0.58 08/19/2022 1024   CREATININE 0.62 05/06/2017 1459   CALCIUM 8.8 (L) 08/25/2022 0428   GFRNONAA >60 08/19/2022 1024   GFRNONAA 110 05/06/2017 1459   GFRAA >60 11/08/2019 1701   GFRAA 127 05/06/2017 1459     Assessment & Plan:  Snoring - Ambulatory referral to Pulmonology  2. Nausea  - ondansetron (ZOFRAN-ODT) 4 MG disintegrating tablet; Take 1 tablet (4 mg total) by mouth every 8 (eight) hours as needed for nausea or vomiting.  Dispense: 20 tablet; Refill: 0  3. Vertigo  - meclizine (ANTIVERT) 12.5 MG tablet; Take 1 tablet (12.5 mg total) by mouth 3 (three) times daily as needed for dizziness.  Dispense: 30 tablet; Refill: 0 - Ambulatory referral to Neurology - CBC - Comprehensive metabolic panel  4. Chronic nonintractable headache, unspecified headache type  - Ambulatory referral to Neurology - CBC - Comprehensive metabolic panel  Follow up:  Follow up in 3 months     Ivonne Andrew, NP 11/04/2022

## 2022-11-04 NOTE — Assessment & Plan Note (Signed)
-   Ambulatory referral to Pulmonology  2. Nausea  - ondansetron (ZOFRAN-ODT) 4 MG disintegrating tablet; Take 1 tablet (4 mg total) by mouth every 8 (eight) hours as needed for nausea or vomiting.  Dispense: 20 tablet; Refill: 0  3. Vertigo  - meclizine (ANTIVERT) 12.5 MG tablet; Take 1 tablet (12.5 mg total) by mouth 3 (three) times daily as needed for dizziness.  Dispense: 30 tablet; Refill: 0 - Ambulatory referral to Neurology - CBC - Comprehensive metabolic panel  4. Chronic nonintractable headache, unspecified headache type  - Ambulatory referral to Neurology - CBC - Comprehensive metabolic panel  Follow up:  Follow up in 3 months

## 2022-11-04 NOTE — Patient Instructions (Signed)
1. Snoring  - Ambulatory referral to Pulmonology  2. Nausea  - ondansetron (ZOFRAN-ODT) 4 MG disintegrating tablet; Take 1 tablet (4 mg total) by mouth every 8 (eight) hours as needed for nausea or vomiting.  Dispense: 20 tablet; Refill: 0  3. Vertigo  - meclizine (ANTIVERT) 12.5 MG tablet; Take 1 tablet (12.5 mg total) by mouth 3 (three) times daily as needed for dizziness.  Dispense: 30 tablet; Refill: 0 - Ambulatory referral to Neurology - CBC - Comprehensive metabolic panel  4. Chronic nonintractable headache, unspecified headache type  - Ambulatory referral to Neurology - CBC - Comprehensive metabolic panel  Follow up:  Follow up in 3 months

## 2022-11-05 LAB — COMPREHENSIVE METABOLIC PANEL
ALT: 9 IU/L (ref 0–32)
AST: 18 IU/L (ref 0–40)
Albumin/Globulin Ratio: 1.5 (ref 1.2–2.2)
Albumin: 4.4 g/dL (ref 3.9–4.9)
Alkaline Phosphatase: 102 IU/L (ref 44–121)
BUN/Creatinine Ratio: 12 (ref 9–23)
BUN: 10 mg/dL (ref 6–24)
Bilirubin Total: <0.2 mg/dL (ref 0.0–1.2)
CO2: 21 mmol/L (ref 20–29)
Calcium: 9 mg/dL (ref 8.7–10.2)
Chloride: 107 mmol/L — ABNORMAL HIGH (ref 96–106)
Creatinine, Ser: 0.84 mg/dL (ref 0.57–1.00)
Globulin, Total: 2.9 g/dL (ref 1.5–4.5)
Glucose: 86 mg/dL (ref 70–99)
Potassium: 3.9 mmol/L (ref 3.5–5.2)
Sodium: 143 mmol/L (ref 134–144)
Total Protein: 7.3 g/dL (ref 6.0–8.5)
eGFR: 85 mL/min/{1.73_m2} (ref >59)

## 2022-11-05 LAB — CBC
Hematocrit: 40.9 % (ref 34.0–46.6)
Hemoglobin: 13.9 g/dL (ref 11.1–15.9)
MCH: 33.1 pg — ABNORMAL HIGH (ref 26.6–33.0)
MCHC: 34 g/dL (ref 31.5–35.7)
MCV: 97 fL (ref 79–97)
Platelets: 315 10*3/uL (ref 150–450)
RBC: 4.2 x10E6/uL (ref 3.77–5.28)
RDW: 14.8 % (ref 11.7–15.4)
WBC: 4.3 10*3/uL (ref 3.4–10.8)

## 2022-11-12 ENCOUNTER — Ambulatory Visit: Admitting: Orthopedic Surgery

## 2022-11-12 NOTE — Progress Notes (Deleted)
Orthopedic Spine Surgery Office Note  Assessment: Patient is a 52 y.o. female with ***   Plan: -Explained that initially conservative treatment is tried as a significant number of patients may experience relief with these treatment modalities. Discussed that the conservative treatments include:  -activity modification  -physical therapy  -over the counter pain medications  -medrol dosepak  -lumbar steroid injections -Patient has tried *** -Recommended *** -Patient should return to office in *** weeks, x-rays at next visit: ***   Patient expressed understanding of the plan and all questions were answered to the patient's satisfaction.   ___________________________________________________________________________  History: Patient is a 53 y.o. female who has been previously seen in the office for symptoms consistent with lumbar radiculopathy. ***  Previous treatments: ***  Physical Exam:  General: no acute distress, appears stated age Neurologic: alert, answering questions appropriately, following commands Respiratory: unlabored breathing on room air, symmetric chest rise Psychiatric: appropriate affect, normal cadence to speech   MSK (spine):  -Strength exam      Left  Right EHL    5/5  5/5 TA    5/5  5/5 GSC    5/5  5/5 Knee extension  5/5  5/5 Hip flexion   5/5  5/5  -Sensory exam    Sensation intact to light touch in L3-S1 nerve distributions of bilateral lower extremities  -Achilles DTR: ***/4 on the left, ***/4 on the right -Patellar tendon DTR: ***/4 on the left, ***/4 on the right  -Straight leg raise: *** -Femoral nerve stretch test: *** -Clonus: no beats bilaterally  Imaging: XR of the lumbar spine from 11/12/2022 was independently reviewed and interpreted, showing ***   Patient name: Jennifer Forbes Patient MRN: [id number] Date of visit: 11/12/22

## 2022-11-17 ENCOUNTER — Ambulatory Visit: Admitting: Nurse Practitioner

## 2022-11-17 NOTE — Progress Notes (Signed)
Initial neurology clinic note  Jennifer Forbes MRN: [id number] DOB: 12/03/1970  Referring provider: Ivonne Andrew, NP  Primary care provider: Ivonne Andrew, NP  Reason for consult:  headaches and dizziness  Subjective:  This is Ms. Jennifer Forbes, a 52 y.o. right-handed female with a medical history of HTN, OA, anxiety, GERD, current smoker who presents to neurology clinic with headache and dizziness. The patient is alone today.  Patient was 13 and had a mattress fall on her. 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 She was on medications, but "grew out of them" and has not had a seizure until the age of 30. She has had headaches since the mattress hit her as well. It is on the top of her head. She states it feels like a hammer is tapping the top of her head (crown of head). The headache has been constant. Patient has seen neurology in the past and got headache medication that helped. She last took medications for headaches in her 30s. She does not remember the name, but it was a daily medication. Patient endorses photophobia, phonophobia, and nausea. She will go lay in a dark room and lay down. After she sleeps, her headache will be gone. She will also take BC headache powder 6=7 times per day. She will get one of these "migraine" headaches once per month.  Last year (2023) patient started having "vertigo". She states she knows when it is going to happen. She will get back spots in her vision and start feeling weak. She will then feel like the room is spinning. She will have to lay there until the vertigo stops. It can last 4-5 days. Patient was started on meclizine which helps but with headaches. It does not help with vertigo. During the vertigo sensations, she does not notice a headache. She has these episodes 3 times per month. The vertigo will occur if she moves her head or sits up. She has falls. She thinks she has fallen about 6 times in 2024.  Usually when she is trying to walk while she has vertigo. She will have to crawl to get places when she has vertigo. Symptoms will then just go away until the next episode.  She denies odd smells or tastes prior to episodes. She has chronic urinary incontinence but does not lose bowel and this does not change during episodes.  She endorses some left sided neck pain.  She has not done therapy.  Patient has appointment for a sleep study next month due to snoring and concern for OSA.  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 TSH was 19 on 09/13/22. Headaches and dizziness has not changed since surgery.  She does not drink caffeine. She smokes 3 black and mild cigars per day. She drinks EtOH very rarely. She smokes marijuana occasionally.  Of note, patient is not dizzy today.   MEDICATIONS:  Outpatient Encounter Medications as of 11/19/2022  Medication Sig Note   amLODipine (NORVASC) 5 MG tablet Take 1 tablet (5 mg total) by mouth daily.    Aspirin-Salicylamide-Caffeine (BC HEADACHE POWDER PO) Take 1-2 packets by mouth every 4 (four) hours as needed (migraines).    calcium carbonate (TUMS) 500 MG chewable tablet Chew 2 tablets (400 mg of elemental calcium total) by mouth 2 (two) times daily.    cetirizine (ZYRTEC) 10 MG tablet Take 1 tablet (10 mg total) by mouth daily.    ciclopirox (LOPROX) 0.77 %  cream Apply topically 2 (two) times daily.    diclofenac (VOLTAREN) 75 MG EC tablet Take 1 tablet (75 mg total) by mouth 2 (two) times daily.    DULoxetine (CYMBALTA) 20 MG capsule Take 1 capsule (20 mg total) by mouth daily.    meclizine (ANTIVERT) 12.5 MG tablet Take 1 tablet (12.5 mg total) by mouth 3 (three) times daily as needed for dizziness.    metoprolol succinate (TOPROL-XL) 25 MG 24 hr tablet Take 1/2 (one-half) tablet by mouth once daily    ondansetron (ZOFRAN-ODT) 4 MG disintegrating tablet Take 1 tablet (4 mg total) by mouth every 8 (eight) hours as needed for  nausea or vomiting.    pantoprazole (PROTONIX) 40 MG tablet Take 1 tablet (40 mg total) by mouth daily.    SYNTHROID 100 MCG tablet Take 1 tablet (100 mcg total) by mouth daily before breakfast.    [DISCONTINUED] fluticasone (FLONASE) 50 MCG/ACT nasal spray Place 2 sprays into both nostrils daily. (Patient not taking: Reported on 05/13/2022)    [DISCONTINUED] traMADol (ULTRAM) 50 MG tablet Take 1 tablet (50 mg total) by mouth every 6 (six) hours as needed for moderate pain. (Patient not taking: Reported on 11/04/2022) 11/04/2022: Needs refill    No facility-administered encounter medications on file as of 11/19/2022.    PAST MEDICAL HISTORY: Past Medical History:  Diagnosis Date   Anxiety    Arthritis    Left knee   GERD (gastroesophageal reflux disease)    Headache    Hypertension    Pneumonia    Tachycardia    Per patient    PAST SURGICAL HISTORY: Past Surgical History:  Procedure Laterality Date   BREAST BIOPSY  2015   left breast   BREAST LUMPECTOMY WITH RADIOACTIVE SEED LOCALIZATION Left 10/01/2021   Procedure: LEFT BREAST LUMPECTOMY WITH RADIOACTIVE SEED LOCALIZATION;  Surgeon: Griselda Miner, MD;  Location: [facility] OR;  Service: General;  Laterality: Left;   LIPOMA RESECTION  march 2014   right shoulder    SKIN BIOPSY     THYROIDECTOMY N/A 08/24/2022   Procedure: TOTAL THYROIDECTOMY;  Surgeon: Darnell Level, MD;  Location: [facility];  Service: General;  Laterality: N/A;    ALLERGIES: Allergies  Allergen Reactions   Amoxicillin Rash    FAMILY HISTORY: Family History  Problem Relation Age of Onset   Hypertension Mother    Hypertension Father    Heart attack Father    Stroke Father    Breast cancer Sister    Diabetes Brother    Breast cancer Maternal Aunt    Diabetes Maternal Uncle     SOCIAL HISTORY: Social History   Tobacco Use   Smoking status: Every Day    Types: Cigars   Smokeless tobacco: Never   Tobacco comments:    black and mild smokes about 3 per day   Vaping Use   Vaping Use: Never used  Substance Use Topics   Alcohol use: Yes    Comment: Occasional Drink   Drug use: Not Currently   Social History   Social History Narrative   Right Handed    Lives in a two story home    Objective:  Vital Signs:  BP 128/83   Pulse 76   Ht 5\' 8"  (1.727 m)   Wt 202 lb (91.6 kg)   LMP 01/20/2019   SpO2 97%   BMI 30.71 kg/m   Patient not currently dizzy/having vertigo during exam today.  General: No acute distress.  Patient appears well-groomed.  Head:  Normocephalic/atraumatic Eyes:  fundi examined but not visualized Neck: supple, tenderness in left neck and shoulder Heart: regular Lungs: Clear to auscultation bilaterally. Vascular: No carotid bruits.  Neurological Exam: Mental status: alert and oriented, speech fluent and not dysarthric, language intact.  Cranial nerves: CN I: not tested CN II: pupils equal, round and reactive to light, visual fields intact CN III, IV, VI:  full range of motion, no nystagmus, no ptosis CN V: facial sensation intact. CN VII: upper and lower face symmetric CN VIII: hearing intact CN IX, X: uvula midline CN XI: sternocleidomastoid and trapezius muscles intact CN XII: tongue midline  Bulk & Tone: normal Motor:  muscle strength 5/5 throughout Deep Tendon Reflexes:  2+ throughout  Sensation:  Light touch sensation intact. Finger to nose testing:  Without dysmetria.   Gait:  Normal station and stride.  Romberg negative.   Labs and Imaging review: Internal labs: Lab Results  Component Value Date   HGBA1C 5.3 05/06/2017   No results found for: "VITAMINB12" Lab Results  Component Value Date   TSH 19.000 (H) 09/13/2022   Lab Results  Component Value Date   ESRSEDRATE 15 10/22/2015   11/04/22: CMP unremarkable CBC significant for MCV of 97  Imaging: Lumbar spine xray (10/04/19): FINDINGS: Lumbar spine degenerative change greatest at L4-5 and L5-S1. Facet arthropathy as the  predominant finding greatest at L5-S1. Vertebral body heights and disc spaces are maintained. Since the study of 2018 there is 4-5 mm of anterolisthesis of L5 on S1.   No significant change on flexion and extension radiographs.   IMPRESSION: 1. Lumbar spine degenerative changes greatest at L4-5 and L5-S1. 2. 4-5 mm of anterolisthesis of L5 on S1. No definite change on flexion and extension.  MRI lumbar spine wo contrast (06/15/16): FINDINGS: Segmentation:  Standard.   Alignment:  Physiologic.   Vertebrae:  Mild marrow edema around the left L5-S1 facet.   Conus medullaris: Extends to the upper L2 level and appears normal. Minimal fat in the filum terminale.   Paraspinal and other soft tissues: Midline subcutaneous edematous signal at its commonly seen. No suspected soft tissue contusion.   Disc levels:   T12- L1: Unremarkable.   L1-L2: Unremarkable.   L2-L3: Mild facet spurring.  No herniation or impingement   L3-L4: Mild facet spurring.  No herniation or impingement   L4-L5: Facet arthropathy with spurring and joint fluid. Tiny protrusion into the inferior left foramen without L4 mass-effect.   L5-S1:Facet arthropathy with spurring greater on the left where there is marrow edema that may be painful. Left facet effusion. Tiny inferior left foraminal protrusion without L5 mass effect.   IMPRESSION: 1. No acute or posttraumatic finding.  No spinal stenosis. 2. Multilevel facet arthropathy, most advanced at L5-S1 on the left where there is degenerative marrow edema which may be painful. 3. Small left foraminal disc protrusions at L4-5 and L5-S1 without impingement.   Assessment/Plan:  Jennifer Forbes is a 52 y.o. female who presents for evaluation of headache and vertigo. She has a relevant medical history of goiter s/p thyroidectomy, HTN, OA, anxiety, GERD, current smoker. Her neurological examination is essentially normal today. Available diagnostic data is significant  for TSH in 09/2022 of 19.0. Patient has subsequently had thyroid surgery for goiters. The etiology of patient's symptoms is currently unclear. She has headaches that do sound consistent with migraines. Vertigo can also be associated with migraine, but 4 days of vertigo without headache is atypical. She may also have an  inner ear pathology given worsening with head turning (such as BPPV), but this is also not clear. After discussion with patient, we agreed to treat migraines and send to vestibular rehab and monitor for improvement. She also takes a lot of over the counter medications for head pain (BC headache powder) that is likely contributing to medication overuse headache. Patient will cut back on this.  PLAN: -Blood work: B12, TSH, free T4 -For migraines: Migraine prevention:  Topamax 25 mg daily Migraine rescue:  Sumatriptan 100 mg as needed at headache onset, can repeat after 2 hours if need, but only once Limit use of pain relievers to no more than 2 days out of week to prevent risk of rebound or medication-overuse headache. Cut back on BC headache powder. Discussed smoking and the harms to her health, including worsening headaches. Keep headache diary Vestibular rehab Meclizine PRN for dizziness  -Return to clinic in 3 months  The impression above as well as the plan as outlined below were extensively discussed with the patient who voiced understanding. All questions were answered to their satisfaction.  The patient was counseled on pertinent fall precautions per the printed material provided today, and as noted under the "Patient Instructions" section below.  When available, results of the above investigations and possible further recommendations will be communicated to the patient via telephone/MyChart. Patient to call office if not contacted after expected testing turnaround time.   Total time spent reviewing records, interview, history/exam, documentation, and coordination of care on  day of encounter:  50 min   Thank you for allowing me to participate in patient's care.  If I can answer any additional questions, I would be pleased to do so.  Jacquelyne Balint, MD   CC: Jennifer Andrew, NP [address] [place] [address]  CC: Referring provider: Ivonne Andrew, NP [address] [place],  [place] [address]

## 2022-11-19 ENCOUNTER — Ambulatory Visit (INDEPENDENT_AMBULATORY_CARE_PROVIDER_SITE_OTHER): Admitting: Neurology

## 2022-11-19 ENCOUNTER — Encounter: Payer: Self-pay | Admitting: Neurology

## 2022-11-19 ENCOUNTER — Other Ambulatory Visit

## 2022-11-19 VITALS — BP 128/83 | HR 76 | Ht 68.0 in | Wt 202.0 lb

## 2022-11-19 DIAGNOSIS — R296 Repeated falls: Secondary | ICD-10-CM | POA: Diagnosis not present

## 2022-11-19 DIAGNOSIS — Z87898 Personal history of other specified conditions: Secondary | ICD-10-CM

## 2022-11-19 DIAGNOSIS — G43109 Migraine with aura, not intractable, without status migrainosus: Secondary | ICD-10-CM

## 2022-11-19 DIAGNOSIS — R42 Dizziness and giddiness: Secondary | ICD-10-CM | POA: Diagnosis not present

## 2022-11-19 DIAGNOSIS — S0990XS Unspecified injury of head, sequela: Secondary | ICD-10-CM | POA: Diagnosis not present

## 2022-11-19 DIAGNOSIS — M542 Cervicalgia: Secondary | ICD-10-CM | POA: Diagnosis not present

## 2022-11-19 MED ORDER — TOPIRAMATE 25 MG PO TABS: 25.0000 mg | ORAL_TABLET | Freq: Every day | ORAL | 5 refills | Status: DC

## 2022-11-19 MED ORDER — SUMATRIPTAN SUCCINATE 100 MG PO TABS: 100.0000 mg | ORAL_TABLET | Freq: Once | ORAL | 5 refills | Status: DC | PRN

## 2022-11-19 NOTE — Patient Instructions (Addendum)
Your headaches sound consistent with migraine. Your dizziness/vertigo symptoms may also be related to migraine or could be an inner ear issue.  I would like to get blood work today. I will be in touch when I have your results.  I would like to treat your symptoms like migraine and see if they improve. -For migraines: Migraine prevention:  Topamax 25 mg daily (sent to your pharmacy) Migraine rescue:  Sumatriptan 100 mg as needed at headache onset, can repeat after 2 hours if need, but only once (sent to your pharmacy) Limit use of pain relievers to no more than 2 days out of week to prevent risk of rebound or medication-overuse headache. Cut back on BC headache powder. Keep headache diary I am referring you for vestibular rehab to help with dizziness. Meclizine as needed for dizziness  Please let me know if you have any questions or concerns in the meantime.   The physicians and staff at [facility] are committed to providing excellent care. You may receive a survey requesting feedback about your experience at our office. We strive to receive "very good" responses to the survey questions. If you feel that your experience would prevent you from giving the office a "very good " response, please contact our office to try to remedy the situation. We may be reached at [phone]. Thank you for taking the time out of your busy day to complete the survey.  Jacquelyne Balint, MD [facility]  Preventing Falls at [facility] are common, often dreaded events in the lives of older people. Aside from the obvious injuries and even death that may result, fall can cause wide-ranging consequences including loss of independence, mental decline, decreased activity and mobility. Younger people are also at risk of falling, especially those with chronic illnesses and fatigue.  Ways to reduce risk for falling Examine diet and medications. Warm foods and alcohol dilate blood vessels, which can lead to  dizziness when standing. Sleep aids, antidepressants and pain medications can also increase the likelihood of a fall.  Get a vision exam. Poor vision, cataracts and glaucoma increase the chances of falling.  Check foot gear. Shoes should fit snugly and have a sturdy, nonskid sole and a broad, low heel  Participate in a physician-approved exercise program to build and maintain muscle strength and improve balance and coordination. Programs that use ankle weights or stretch bands are excellent for muscle-strengthening. Water aerobics programs and low-impact Tai Chi programs have also been shown to improve balance and coordination.  Increase vitamin D intake. Vitamin D improves muscle strength and increases the amount of calcium the body is able to absorb and deposit in bones.  How to prevent falls from common hazards Floors - Remove all loose wires, cords, and throw rugs. Minimize clutter. Make sure rugs are anchored and smooth. Keep furniture in its usual place.  Chairs -- Use chairs with straight backs, armrests and firm seats. Add firm cushions to existing pieces to add height.  Bathroom - Install grab bars and non-skid tape in the tub or shower. Use a bathtub transfer bench or a shower chair with a back support Use an elevated toilet seat and/or safety rails to assist standing from a low surface. Do not use towel racks or bathroom tissue holders to help you stand.  Lighting - Make sure halls, stairways, and entrances are well-lit. Install a night light in your bathroom or hallway. Make sure there is a light switch at the top and bottom of the staircase.  Turn lights on if you get up in the middle of the night. Make sure lamps or light switches are within reach of the bed if you have to get up during the night.  Kitchen - Install non-skid rubber mats near the sink and stove. Clean spills immediately. Store frequently used utensils, pots, pans between waist and eye level. This helps prevent reaching  and bending. Sit when getting things out of lower cupboards.  Living room/ Bedrooms - Place furniture with wide spaces in between, giving enough room to move around. Establish a route through the living room that gives you something to hold onto as you walk.  Stairs - Make sure treads, rails, and rugs are secure. Install a rail on both sides of the stairs. If stairs are a threat, it might be helpful to arrange most of your activities on the lower level to reduce the number of times you must climb the stairs.  Entrances and doorways - Install metal handles on the walls adjacent to the doorknobs of all doors to make it more secure as you travel through the doorway.  Tips for maintaining balance Keep at least one hand free at all times. Try using a backpack or fanny pack to hold things rather than carrying them in your hands. Never carry objects in both hands when walking as this interferes with keeping your balance.  Attempt to swing both arms from front to back while walking. This might require a conscious effort if Parkinson's disease has diminished your movement. It will, however, help you to maintain balance and posture, and reduce fatigue.  Consciously lift your feet off of the ground when walking. Shuffling and dragging of the feet is a common culprit in losing your balance.  When trying to navigate turns, use a "U" technique of facing forward and making a wide turn, rather than pivoting sharply.  Try to stand with your feet shoulder-length apart. When your feet are close together for any length of time, you increase your risk of losing your balance and falling.  Do one thing at a time. Don't try to walk and accomplish another task, such as reading or looking around. The decrease in your automatic reflexes complicates motor function, so the less distraction, the better.  Do not wear rubber or gripping soled shoes, they might "catch" on the floor and cause tripping.  Move slowly when  changing positions. Use deliberate, concentrated movements and, if needed, use a grab bar or walking aid. Count 15 seconds between each movement. For example, when rising from a seated position, wait 15 seconds after standing to begin walking.  If balance is a continuous problem, you might want to consider a walking aid such as a cane, walking stick, or walker. Once you've mastered walking with help, you might be ready to try it on your own again.

## 2022-11-20 LAB — VITAMIN B12: Vitamin B-12: 504 pg/mL (ref 200–1100)

## 2022-11-22 ENCOUNTER — Other Ambulatory Visit: Payer: Self-pay | Admitting: Nurse Practitioner

## 2022-11-25 ENCOUNTER — Other Ambulatory Visit: Payer: Self-pay

## 2022-11-25 DIAGNOSIS — I10 Essential (primary) hypertension: Secondary | ICD-10-CM

## 2022-11-25 DIAGNOSIS — F32A Depression, unspecified: Secondary | ICD-10-CM

## 2022-11-25 DIAGNOSIS — R000 Tachycardia, unspecified: Secondary | ICD-10-CM

## 2022-11-25 MED ORDER — DULOXETINE HCL 20 MG PO CPEP: 20.0000 mg | ORAL_CAPSULE | Freq: Every day | ORAL | 0 refills | Status: DC

## 2022-11-25 MED ORDER — AMLODIPINE BESYLATE 5 MG PO TABS: 5.0000 mg | ORAL_TABLET | Freq: Every day | ORAL | 0 refills | Status: DC

## 2022-11-25 MED ORDER — METOPROLOL SUCCINATE ER 25 MG PO TB24: 12.5000 mg | ORAL_TABLET | Freq: Every day | ORAL | 0 refills | Status: DC

## 2022-11-25 NOTE — Telephone Encounter (Signed)
Please advise K[facility] 

## 2022-11-28 DIAGNOSIS — R32 Unspecified urinary incontinence: Secondary | ICD-10-CM | POA: Diagnosis not present

## 2022-11-30 ENCOUNTER — Encounter: Admitting: Physical Therapy

## 2022-12-16 ENCOUNTER — Ambulatory Visit: Admitting: Neurology

## 2022-12-21 ENCOUNTER — Telehealth: Payer: Self-pay | Admitting: Physical Therapy

## 2022-12-21 ENCOUNTER — Ambulatory Visit: Attending: Physical Therapy | Admitting: Physical Therapy
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
## 2022-12-23 ENCOUNTER — Encounter: Payer: Self-pay | Admitting: Primary Care

## 2022-12-23 ENCOUNTER — Ambulatory Visit: Admitting: Primary Care

## 2022-12-23 VITALS — BP 108/62 | HR 67 | Temp 98.0°F | Ht 68.0 in | Wt 211.0 lb

## 2022-12-23 DIAGNOSIS — R0683 Snoring: Secondary | ICD-10-CM

## 2022-12-23 NOTE — Assessment & Plan Note (Signed)
-   Patient has symptoms of loud snoring, witnessed apnea, restless sleep and daytime sleepiness. Epworth 11/24.  She is at risk for OSA due to obesity.  Patient needs home sleep study to evaluate.  We reviewed risk of untreated sleep apnea including cardiac arrhythmias, pulmonary hypertension, diabetes and stroke.  We also discussed treatment options including weight loss, oral appliance, CPAP therapy referral to ENT for possible surgical options.  Encourage side sleeping position or elevate head of bed 30 degrees.  Advised against driving if experiencing excessive daytime sleepiness fatigue. Cautioned against alcohol use/sedatives prior to bedtime as these can worsen sleep apnea.  Follow-up 1 to 2 weeks after sleep study review results and treatment options if needed.

## 2022-12-23 NOTE — Progress Notes (Signed)
Reviewed and agree with assessment/plan.   Coralyn Helling, MD [facility] 12/23/2022, 1:21 PM Pager:  [phone]

## 2022-12-23 NOTE — Progress Notes (Signed)
@Patient  ID: Jennifer Forbes, female    DOB: [birth year], 52 y.o.   MRN: [id number]  Chief Complaint  Patient presents with   Consult    Sleep consult-snoring    Referring provider: Ivonne Andrew, NP  HPI: 52 year old female, current everyday smoker.  Past medical history significant for hypertension, GERD, dysphagia, multinodular goiter s/p thyroidectomy, rheumatoid arthritis, morbid obesity.  12/23/2022 Presents today for sleep consult.  She has been struggling with her sleep for several years. Her husband has sleep apnea and told her that she needed to be evaluated. She has symptoms of loud snoring, waking up gasping for air, restless sleep and daytime sleepiness. She had headaches throughout the day. Bedtime is between 10:30 and 11:30 PM.  It takes her on average 1 hour to fall asleep.  She does not wake up typically throughout the night.  She starts her day between 6 AM and 9 AM.  Her weight is down 100 pounds over the last 2 years.  She does not operate heavy machinery.  She has never had a sleep study.  She does not wear CPAP or oxygen.  Epworth score 11.  Allergies  Allergen Reactions   Amoxicillin Rash    Immunization History  Administered Date(s) Administered   Influenza,inj,Quad PF,6+ Mos 02/14/2019   Pneumococcal Polysaccharide-23 10/22/2015   Tdap 06/22/2016    Past Medical History:  Diagnosis Date   Anxiety    Arthritis    Left knee   GERD (gastroesophageal reflux disease)    Headache    Hypertension    Pneumonia    Tachycardia    Per patient    Tobacco History: Social History   Tobacco Use  Smoking Status Every Day   Types: E-cigarettes  Smokeless Tobacco Never  Tobacco Comments   black and mild smokes about 3 per day   Ready to quit: Not Answered Counseling given: Not Answered Tobacco comments: black and mild smokes about 3 per day   Outpatient Medications Prior to Visit  Medication Sig Dispense Refill   amLODipine (NORVASC) 5 MG tablet  Take 1 tablet (5 mg total) by mouth daily. 90 tablet 0   Aspirin-Salicylamide-Caffeine (BC HEADACHE POWDER PO) Take 1-2 packets by mouth every 4 (four) hours as needed (migraines).     ciclopirox (LOPROX) 0.77 % cream Apply topically 2 (two) times daily. 15 g 0   DULoxetine (CYMBALTA) 20 MG capsule Take 1 capsule (20 mg total) by mouth daily. 30 capsule 0   meclizine (ANTIVERT) 12.5 MG tablet Take 1 tablet (12.5 mg total) by mouth 3 (three) times daily as needed for dizziness. 30 tablet 0   metoprolol succinate (TOPROL-XL) 25 MG 24 hr tablet Take 0.5 tablets (12.5 mg total) by mouth daily. 45 tablet 0   ondansetron (ZOFRAN-ODT) 4 MG disintegrating tablet Take 1 tablet (4 mg total) by mouth every 8 (eight) hours as needed for nausea or vomiting. 20 tablet 0   pantoprazole (PROTONIX) 40 MG tablet Take 1 tablet (40 mg total) by mouth daily. 90 tablet 3   SUMAtriptan (IMITREX) 100 MG tablet Take 1 tablet (100 mg total) by mouth once as needed for up to 1 dose for migraine. May repeat in 2 hours if headache persists or recurs. 10 tablet 5   SYNTHROID 100 MCG tablet TAKE 1 TABLET BY MOUTH ONCE DAILY BEFORE BREAKFAST 30 tablet 0   topiramate (TOPAMAX) 25 MG tablet Take 1 tablet (25 mg total) by mouth daily. 30 tablet 5   calcium carbonate (  TUMS) 500 MG chewable tablet Chew 2 tablets (400 mg of elemental calcium total) by mouth 2 (two) times daily. (Patient not taking: Reported on 12/23/2022) 90 tablet 1   cetirizine (ZYRTEC) 10 MG tablet Take 1 tablet (10 mg total) by mouth daily. (Patient not taking: Reported on 12/23/2022) 30 tablet 11   diclofenac (VOLTAREN) 75 MG EC tablet Take 1 tablet (75 mg total) by mouth 2 (two) times daily. (Patient not taking: Reported on 12/23/2022) 30 tablet 0   No facility-administered medications prior to visit.    Review of Systems  Review of Systems  Constitutional:  Positive for fatigue.  Respiratory:  Positive for apnea.   Psychiatric/Behavioral:  Positive for sleep  disturbance.    Physical Exam  BP 108/62 (BP Location: Left Arm, Cuff Size: Large)   Pulse 67   Temp 98 F (36.7 C) (Temporal)   Ht 5\' 8"  (1.727 m)   Wt 211 lb (95.7 kg)   LMP 01/20/2019   SpO2 98%   BMI 32.08 kg/m  Physical Exam Constitutional:      Appearance: Normal appearance.  HENT:     Head: Normocephalic and atraumatic.  Cardiovascular:     Rate and Rhythm: Normal rate and regular rhythm.  Pulmonary:     Effort: Pulmonary effort is normal.     Breath sounds: Normal breath sounds.  Musculoskeletal:        General: Normal range of motion.  Skin:    General: Skin is warm and dry.  Neurological:     General: No focal deficit present.     Mental Status: She is alert and oriented to person, place, and time. Mental status is at baseline.  Psychiatric:        Mood and Affect: Mood normal.        Behavior: Behavior normal.        Thought Content: Thought content normal.        Judgment: Judgment normal.      Lab Results:  CBC    Component Value Date/Time   WBC 4.3 11/04/2022 1150   WBC 3.6 (L) 08/19/2022 1024   RBC 4.20 11/04/2022 1150   RBC 4.24 08/19/2022 1024   HGB 13.9 11/04/2022 1150   HCT 40.9 11/04/2022 1150   PLT 315 11/04/2022 1150   MCV 97 11/04/2022 1150   MCH 33.1 (H) 11/04/2022 1150   MCH 32.5 08/19/2022 1024   MCHC 34.0 11/04/2022 1150   MCHC 32.5 08/19/2022 1024   RDW 14.8 11/04/2022 1150   LYMPHSABS 1.7 11/08/2019 1701   LYMPHSABS 2.2 10/03/2019 1001   MONOABS 1.3 (H) 11/08/2019 1701   EOSABS 0.3 11/08/2019 1701   EOSABS 0.2 10/03/2019 1001   BASOSABS 0.1 11/08/2019 1701   BASOSABS 0.1 10/03/2019 1001    BMET    Component Value Date/Time   NA 143 11/04/2022 1150   K 3.9 11/04/2022 1150   CL 107 (H) 11/04/2022 1150   CO2 21 11/04/2022 1150   GLUCOSE 86 11/04/2022 1150   GLUCOSE 77 08/19/2022 1024   BUN 10 11/04/2022 1150   CREATININE 0.84 11/04/2022 1150   CREATININE 0.62 05/06/2017 1459   CALCIUM 9.0 11/04/2022 1150    GFRNONAA >60 08/19/2022 1024   GFRNONAA 110 05/06/2017 1459   GFRAA >60 11/08/2019 1701   GFRAA 127 05/06/2017 1459    BNP No results found for: "BNP"  ProBNP No results found for: "PROBNP"  Imaging: No results found.   Assessment & Plan:   Loud snoring -  Patient has symptoms of loud snoring, witnessed apnea, restless sleep and daytime sleepiness. Epworth 11/24.  She is at risk for OSA due to obesity.  Patient needs home sleep study to evaluate.  We reviewed risk of untreated sleep apnea including cardiac arrhythmias, pulmonary hypertension, diabetes and stroke.  We also discussed treatment options including weight loss, oral appliance, CPAP therapy referral to ENT for possible surgical options.  Encourage side sleeping position or elevate head of bed 30 degrees.  Advised against driving if experiencing excessive daytime sleepiness fatigue. Cautioned against alcohol use/sedatives prior to bedtime as these can worsen sleep apnea.  Follow-up 1 to 2 weeks after sleep study review results and treatment options if needed.   Glenford Bayley, NP 12/23/2022

## 2022-12-23 NOTE — Patient Instructions (Addendum)
  Sleep apnea is defined as period of 10 seconds or longer when you stop breathing at night. This can happen multiple times a night. Dx sleep apnea is when this occurs more than 5 times an hour.    Mild OSA 5-15 apneic events an hour Moderate OSA 15-30 apneic events an hour Severe OSA > 30 apneic events an hour   Untreated sleep apnea puts you at higher risk for cardiac arrhythmias, pulmonary HTN, stroke and diabetes  Treatment options include weight loss, side sleeping position, oral appliance, CPAP therapy or referral to ENT for possible surgical options    Recommendations: Focus on side sleeping position or elevate head with wedge pillow 30 degrees Work on weight loss efforts if able  Do not drive if experiencing excessive daytime sleepiness of fatigue    Orders: Home sleep study re: loud snoring (ordered)   Follow-up: Please call to schedule follow-up 1-2 weeks after completing home sleep study to review results and treatment if needed (can be virtual) 

## 2022-12-24 ENCOUNTER — Other Ambulatory Visit: Payer: Self-pay | Admitting: Nurse Practitioner

## 2022-12-28 ENCOUNTER — Ambulatory Visit: Admitting: Gastroenterology

## 2022-12-29 ENCOUNTER — Other Ambulatory Visit: Payer: Self-pay | Admitting: Nurse Practitioner

## 2022-12-29 DIAGNOSIS — F32A Depression, unspecified: Secondary | ICD-10-CM

## 2022-12-29 NOTE — Telephone Encounter (Signed)
Please advise K[facility] 

## 2023-01-05 NOTE — Telephone Encounter (Signed)
Pt. Needs new Dme company due to insurance

## 2023-01-07 NOTE — Telephone Encounter (Signed)
I tried to speak with the patient earlier in the week but the phones were acting up and I lost connection. I tried to call the patient again today. I just need to verify if she still has the [employer]. If she does then [facility] can still do here HST

## 2023-01-16 DIAGNOSIS — R32 Unspecified urinary incontinence: Secondary | ICD-10-CM | POA: Diagnosis not present

## 2023-01-24 ENCOUNTER — Ambulatory Visit: Admitting: Nurse Practitioner

## 2023-01-24 DIAGNOSIS — E890 Postprocedural hypothyroidism: Secondary | ICD-10-CM

## 2023-02-04 ENCOUNTER — Ambulatory Visit: Payer: Self-pay | Admitting: Nurse Practitioner

## 2023-02-09 ENCOUNTER — Ambulatory Visit: Payer: Self-pay | Admitting: Nurse Practitioner

## 2023-02-14 NOTE — Progress Notes (Deleted)
NEUROLOGY FOLLOW UP OFFICE NOTE  Jennifer Forbes [id number]  Subjective:  Jennifer Forbes is a 52 y.o. year old right-handed female with a medical history of HTN, OA, anxiety, GERD, current smoker who we last saw on 11/19/22 for headaches and dizziness.  To briefly review: Patient was 13 and had a mattress fall on her. 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 She was on medications, but "grew out of them" and has not had a seizure until the age of 86. She has had headaches since the mattress hit her as well. It is on the top of her head. She states it feels like a hammer is tapping the top of her head (crown of head). The headache has been constant. Patient has seen neurology in the past and got headache medication that helped. She last took medications for headaches in her 30s. She does not remember the name, but it was a daily medication. Patient endorses photophobia, phonophobia, and nausea. She will go lay in a dark room and lay down. After she sleeps, her headache will be gone. She will also take BC headache powder 6=7 times per day. She will get one of these "migraine" headaches once per month.   Last year (2023) patient started having "vertigo". She states she knows when it is going to happen. She will get back spots in her vision and start feeling weak. She will then feel like the room is spinning. She will have to lay there until the vertigo stops. It can last 4-5 days. Patient was started on meclizine which helps but with headaches. It does not help with vertigo. During the vertigo sensations, she does not notice a headache. She has these episodes 3 times per month. The vertigo will occur if she moves her head or sits up. She has falls. She thinks she has fallen about 6 times in 2024. Usually when she is trying to walk while she has vertigo. She will have to crawl to get places when she has vertigo. Symptoms will then just go away until the next episode.    She denies odd smells or tastes prior to episodes. She has chronic urinary incontinence but does not lose bowel and this does not change during episodes.   She endorses some left sided neck pain.   She has not done therapy.   Patient has appointment for a sleep study next month due to snoring and concern for OSA.   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 TSH was 19 on 09/13/22. Headaches and dizziness has not changed since surgery.   She does not drink caffeine. She smokes 3 black and mild cigars per day. She drinks EtOH very rarely. She smokes marijuana occasionally.   Of note, patient is not dizzy today.  Most recent Assessment and Plan (11/19/22): Jennifer Forbes is a 52 y.o. female who presents for evaluation of headache and vertigo. She has a relevant medical history of goiter s/p thyroidectomy, HTN, OA, anxiety, GERD, current smoker. Her neurological examination is essentially normal today. Available diagnostic data is significant for TSH in 09/2022 of 19.0. Patient has subsequently had thyroid surgery for goiters. The etiology of patient's symptoms is currently unclear. She has headaches that do sound consistent with migraines. Vertigo can also be associated with migraine, but 4 days of vertigo without headache is atypical. She may also have an inner ear pathology given worsening with head turning (such as BPPV), but this  is also not clear. After discussion with patient, we agreed to treat migraines and send to vestibular rehab and monitor for improvement. She also takes a lot of over the counter medications for head pain (BC headache powder) that is likely contributing to medication overuse headache. Patient will cut back on this.   PLAN: -Blood work: B12, TSH, free T4 -For migraines: Migraine prevention:  Topamax 25 mg daily Migraine rescue:  Sumatriptan 100 mg as needed at headache onset, can repeat after 2 hours if need, but only once Limit use of pain  relievers to no more than 2 days out of week to prevent risk of rebound or medication-overuse headache. Cut back on BC headache powder. Discussed smoking and the harms to her health, including worsening headaches. Keep headache diary Vestibular rehab Meclizine PRN for dizziness  Since their last visit: B12 was normal. TSH not drawn?***  Headaches?***  Current medications: ***  Side effects: ***  MEDICATIONS:  Outpatient Encounter Medications as of 02/23/2023  Medication Sig   amLODipine (NORVASC) 5 MG tablet Take 1 tablet (5 mg total) by mouth daily.   Aspirin-Salicylamide-Caffeine (BC HEADACHE POWDER PO) Take 1-2 packets by mouth every 4 (four) hours as needed (migraines).   ciclopirox (LOPROX) 0.77 % cream Apply topically 2 (two) times daily.   diclofenac (VOLTAREN) 75 MG EC tablet Take 1 tablet (75 mg total) by mouth 2 (two) times daily. (Patient not taking: Reported on 12/23/2022)   DULoxetine (CYMBALTA) 20 MG capsule Take 1 capsule by mouth once daily   meclizine (ANTIVERT) 12.5 MG tablet Take 1 tablet (12.5 mg total) by mouth 3 (three) times daily as needed for dizziness.   metoprolol succinate (TOPROL-XL) 25 MG 24 hr tablet Take 0.5 tablets (12.5 mg total) by mouth daily.   ondansetron (ZOFRAN-ODT) 4 MG disintegrating tablet Take 1 tablet (4 mg total) by mouth every 8 (eight) hours as needed for nausea or vomiting.   pantoprazole (PROTONIX) 40 MG tablet Take 1 tablet (40 mg total) by mouth daily.   SUMAtriptan (IMITREX) 100 MG tablet Take 1 tablet (100 mg total) by mouth once as needed for up to 1 dose for migraine. May repeat in 2 hours if headache persists or recurs.   SYNTHROID 100 MCG tablet TAKE 1 TABLET BY MOUTH ONCE DAILY BEFORE BREAKFAST   topiramate (TOPAMAX) 25 MG tablet Take 1 tablet (25 mg total) by mouth daily.   No facility-administered encounter medications on file as of 02/23/2023.    PAST MEDICAL HISTORY: Past Medical History:  Diagnosis Date   Anxiety     Arthritis    Left knee   GERD (gastroesophageal reflux disease)    Headache    Hypertension    Pneumonia    Tachycardia    Per patient    PAST SURGICAL HISTORY: Past Surgical History:  Procedure Laterality Date   BREAST BIOPSY  2015   left breast   BREAST LUMPECTOMY WITH RADIOACTIVE SEED LOCALIZATION Left 10/01/2021   Procedure: LEFT BREAST LUMPECTOMY WITH RADIOACTIVE SEED LOCALIZATION;  Surgeon: Griselda Miner, MD;  Location: [facility] OR;  Service: General;  Laterality: Left;   LIPOMA RESECTION  march 2014   right shoulder    SKIN BIOPSY     THYROIDECTOMY N/A 08/24/2022   Procedure: TOTAL THYROIDECTOMY;  Surgeon: Darnell Level, MD;  Location: [facility] ORS;  Service: General;  Laterality: N/A;    ALLERGIES: Allergies  Allergen Reactions   Amoxicillin Rash    FAMILY HISTORY: Family History  Problem Relation Age of  Onset   Hypertension Mother    Hypertension Father    Heart attack Father    Stroke Father    Breast cancer Sister    Diabetes Brother    Breast cancer Maternal Aunt    Diabetes Maternal Uncle     SOCIAL HISTORY: Social History   Tobacco Use   Smoking status: Every Day    Types: E-cigarettes   Smokeless tobacco: Never   Tobacco comments:    black and mild smokes about 3 per day  Vaping Use   Vaping status: Never Used  Substance Use Topics   Alcohol use: Yes    Comment: Occasional Drink   Drug use: Not Currently   Social History   Social History Narrative   Right Handed    Lives in a two story home      Objective:  Vital Signs:  LMP 01/20/2019   ***  Labs and Imaging review: New results: B12 (11/19/22): 504  Previously reviewed results: Lab Results  Component Value Date    HGBA1C 5.3 05/06/2017      Recent Labs       Lab Results  Component Value Date    TSH 19.000 (H) 09/13/2022      Recent Labs[] Expand by Default       Lab Results  Component Value Date    ESRSEDRATE 15 10/22/2015      11/04/22: CMP unremarkable CBC significant  for MCV of 97   Imaging: Lumbar spine xray (10/04/19): FINDINGS: Lumbar spine degenerative change greatest at L4-5 and L5-S1. Facet arthropathy as the predominant finding greatest at L5-S1. Vertebral body heights and disc spaces are maintained. Since the study of 2018 there is 4-5 mm of anterolisthesis of L5 on S1.   No significant change on flexion and extension radiographs.   IMPRESSION: 1. Lumbar spine degenerative changes greatest at L4-5 and L5-S1. 2. 4-5 mm of anterolisthesis of L5 on S1. No definite change on flexion and extension.   MRI lumbar spine wo contrast (06/15/16): FINDINGS: Segmentation:  Standard.   Alignment:  Physiologic.   Vertebrae:  Mild marrow edema around the left L5-S1 facet.   Conus medullaris: Extends to the upper L2 level and appears normal. Minimal fat in the filum terminale.   Paraspinal and other soft tissues: Midline subcutaneous edematous signal at its commonly seen. No suspected soft tissue contusion.   Disc levels:   T12- L1: Unremarkable.   L1-L2: Unremarkable.   L2-L3: Mild facet spurring.  No herniation or impingement   L3-L4: Mild facet spurring.  No herniation or impingement   L4-L5: Facet arthropathy with spurring and joint fluid. Tiny protrusion into the inferior left foramen without L4 mass-effect.   L5-S1:Facet arthropathy with spurring greater on the left where there is marrow edema that may be painful. Left facet effusion. Tiny inferior left foraminal protrusion without L5 mass effect.   IMPRESSION: 1. No acute or posttraumatic finding.  No spinal stenosis. 2. Multilevel facet arthropathy, most advanced at L5-S1 on the left where there is degenerative marrow edema which may be painful. 3. Small left foraminal disc protrusions at L4-5 and L5-S1 without impingement.  Assessment/Plan:  This is Jennifer Forbes, a 52 y.o. female with: ***   Plan: ***  Return to clinic in ***  Total time spent reviewing records,  interview, history/exam, documentation, and coordination of care on day of encounter:  *** min  Jacquelyne Balint, MD

## 2023-02-23 ENCOUNTER — Ambulatory Visit: Admitting: Neurology

## 2023-02-23 ENCOUNTER — Encounter: Payer: Self-pay | Admitting: Neurology

## 2023-02-24 DIAGNOSIS — R32 Unspecified urinary incontinence: Secondary | ICD-10-CM | POA: Diagnosis not present

## 2023-03-06 ENCOUNTER — Other Ambulatory Visit: Payer: Self-pay | Admitting: Nurse Practitioner

## 2023-03-06 DIAGNOSIS — F32A Depression, unspecified: Secondary | ICD-10-CM

## 2023-03-16 IMAGING — MG MM DIGITAL DIAGNOSTIC UNILAT*L* W/ TOMO W/ CAD
6 of 12 series · 6 of 36 positions shown · non-contrast
Comparison: Previous exam(s).
COMPARISON: Previous exam(s).

Addendum:
CLINICAL DATA: Surgical excision September 2021 for benign
calcifications. Hard lump in the left low axilla since that time.

EXAM:
DIGITAL DIAGNOSTIC UNILATERAL LEFT MAMMOGRAM WITH TOMOSYNTHESIS AND
CAD
TECHNIQUE: Left digital diagnostic mammography and breast tomosynthesis was
performed. The images were evaluated with computer-aided detection.

[L CC synth-2D (1 of 3)]
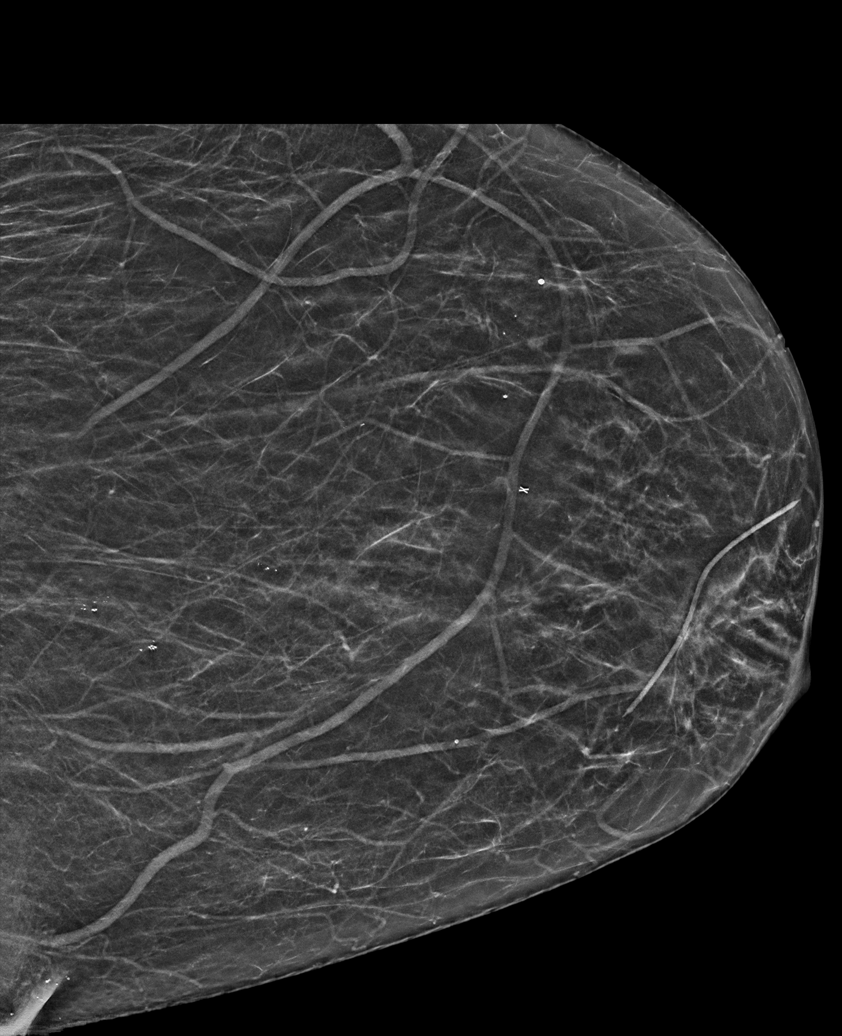

[L MLO synth-2D (1 of 2)]
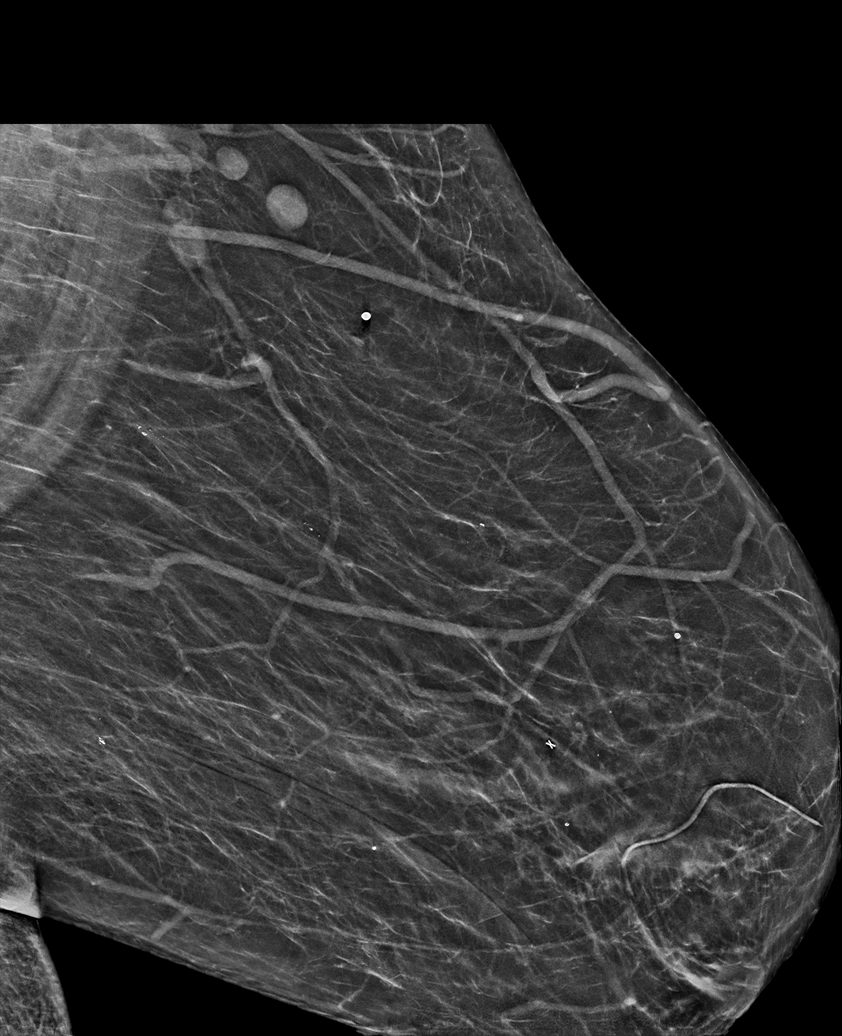

[L MLO synth-2D (2 of 2)]
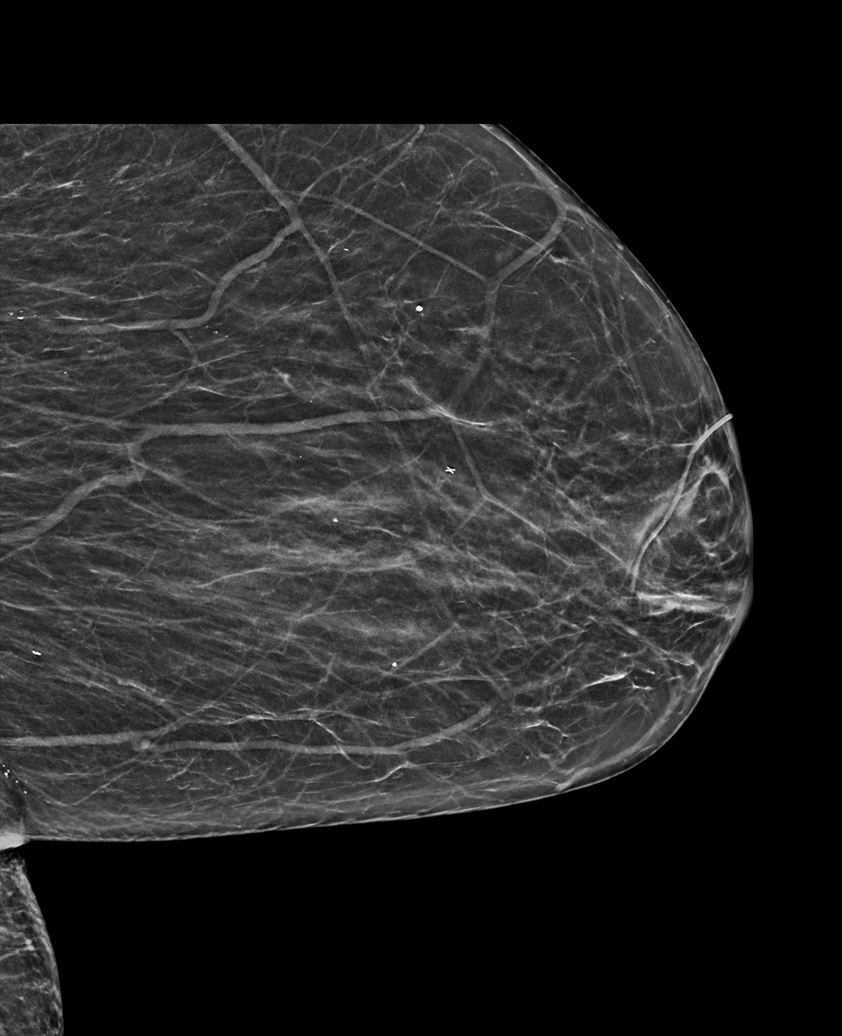

[L CC synth-2D (2 of 3)]
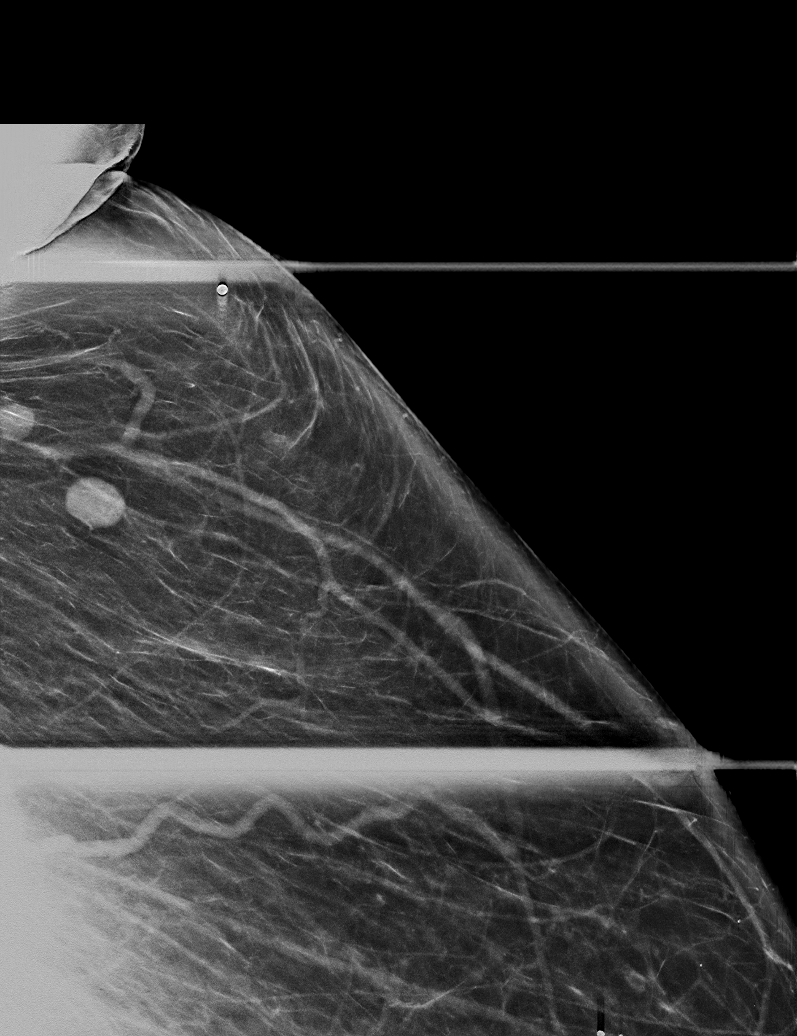

[L XCCL synth-2D]
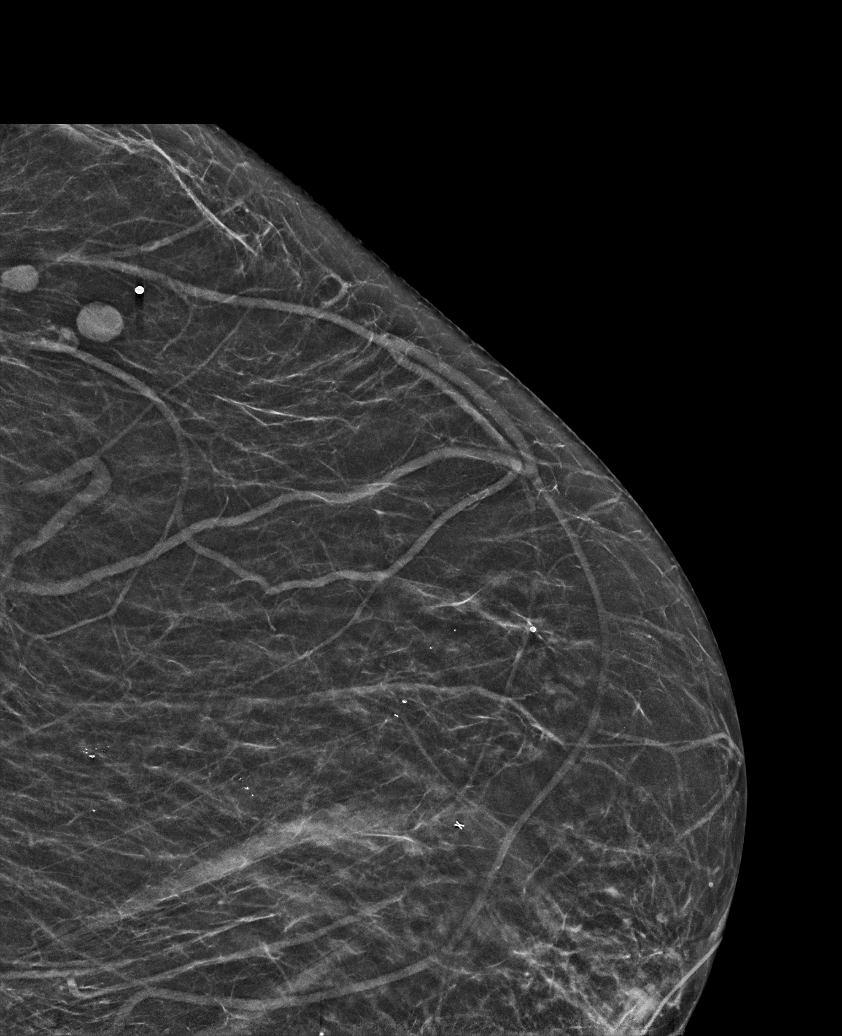

[L CC synth-2D (3 of 3)]
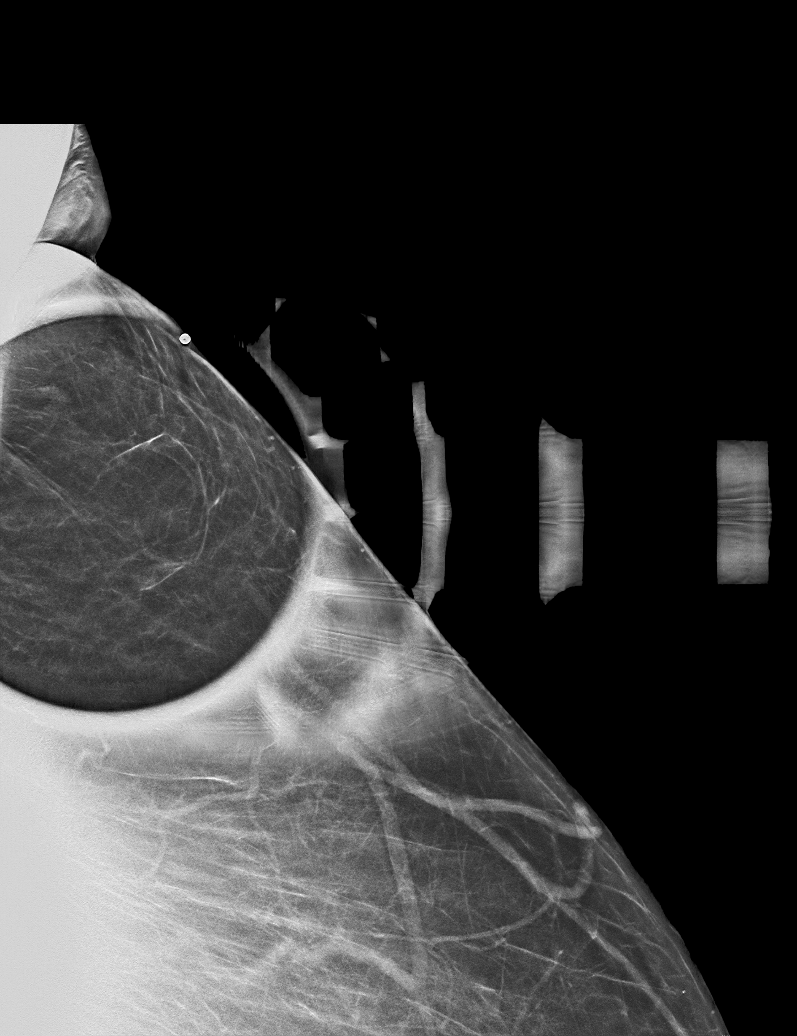

[6 of 36 positions shown; findings below may reference images not displayed]

ACR Breast Density Category b: There are scattered areas of
fibroglandular density.
FINDINGS: No suspicious masses, calcifications, or distortion are identified
in the left breast.

Targeted ultrasound is performed, showing normal subcutaneous fat
and a few small normal appearing lymph nodes in the region of the
patient's symptoms.
IMPRESSION: No mammographic or sonographic evidence of malignancy.

RECOMMENDATION:
Treatment of the patient's symptoms should be based on clinical and
physical exam given lack of imaging findings. Recommend annual
screening mammography in Friday July, 2022.

I have discussed the findings and recommendations with the patient.
If applicable, a reminder letter will be sent to the patient
regarding the next appointment.

BI-RADS CATEGORY  2: Benign.

ADDENDUM:
Left breast ultrasound was also performed.

*** End of Addendum ***
ACR Breast Density Category b: There are scattered areas of
fibroglandular density.
FINDINGS: No suspicious masses, calcifications, or distortion are identified
in the left breast.

Targeted ultrasound is performed, showing normal subcutaneous fat
and a few small normal appearing lymph nodes in the region of the
patient's symptoms.
IMPRESSION: No mammographic or sonographic evidence of malignancy.

RECOMMENDATION:
Treatment of the patient's symptoms should be based on clinical and
physical exam given lack of imaging findings. Recommend annual
screening mammography in Friday July, 2022.

I have discussed the findings and recommendations with the patient.
If applicable, a reminder letter will be sent to the patient
regarding the next appointment.

BI-RADS CATEGORY  2: Benign.

## 2023-03-25 ENCOUNTER — Other Ambulatory Visit: Payer: Self-pay | Admitting: Nurse Practitioner

## 2023-03-25 DIAGNOSIS — R000 Tachycardia, unspecified: Secondary | ICD-10-CM

## 2023-03-28 ENCOUNTER — Other Ambulatory Visit: Payer: Self-pay

## 2023-03-28 ENCOUNTER — Telehealth: Admitting: Family Medicine

## 2023-03-28 DIAGNOSIS — R531 Weakness: Secondary | ICD-10-CM

## 2023-03-28 DIAGNOSIS — F32A Depression, unspecified: Secondary | ICD-10-CM

## 2023-03-28 DIAGNOSIS — M5442 Lumbago with sciatica, left side: Secondary | ICD-10-CM

## 2023-03-28 DIAGNOSIS — R000 Tachycardia, unspecified: Secondary | ICD-10-CM

## 2023-03-28 DIAGNOSIS — R634 Abnormal weight loss: Secondary | ICD-10-CM

## 2023-03-28 DIAGNOSIS — R32 Unspecified urinary incontinence: Secondary | ICD-10-CM

## 2023-03-28 MED ORDER — METOPROLOL SUCCINATE ER 25 MG PO TB24: 25.0000 mg | ORAL_TABLET | Freq: Every day | ORAL | 0 refills | Status: DC

## 2023-03-28 MED ORDER — DULOXETINE HCL 20 MG PO CPEP
20.0000 mg | ORAL_CAPSULE | Freq: Every day | ORAL | 0 refills | Status: DC
Start: 2023-03-28 — End: 2023-04-04

## 2023-03-28 NOTE — Progress Notes (Signed)
Based on what you shared with me, I feel your condition warrants further evaluation as soon as possible at an Emergency department. Describing back pain with weakness, incontinence and weight loss shows there may be something else going on that is more severe and this should be evaluated for.    NOTE: There will be NO CHARGE for this eVisit   If you are having a true medical emergency please call 911.      Emergency Department-[facility]  Get Driving Directions  [phone]  [address]  [place], [place] [address]  Open 24/7/365      [facility] Emergency Department at [facility]  Get Driving Directions  [address]  [place], [place] [address]  Open 24/7/365    Emergency Department- [facility]  Get Driving Directions  [phone]  [address]  [place], [place] [address]  Open 24/7/365      [facility] Emergency Department at [facility]  Get Driving Directions  [phone]  [address]  [place], [place] [address]  Open 24/7/365    [facility]  Emergency Department- [facility]  Get Driving Directions  [phone]  [address]  [place], [place] [address]  Open 24/7/365    [place]  Emergency Department- [facility]  Get Driving Directions  [address]  [place], [place] [address]  Open 24/7/365    [facility]  Emergency Department- [place]  Get Driving Directions  [phone]  [address]  [place], [place] [address]  Open 24/7/365    I have spent 5 minutes in review of e-visit questionnaire, review and updating patient chart, medical decision making and response to patient.   Margaretann Loveless, PA-C

## 2023-03-28 NOTE — Telephone Encounter (Signed)
Please advise K[facility] 

## 2023-04-04 ENCOUNTER — Ambulatory Visit (HOSPITAL_COMMUNITY)
Admission: EM | Admit: 2023-04-04 | Discharge: 2023-04-04 | Disposition: A | Discharge status: Home / Self Care | Payer: Medicaid Other | Attending: Psychiatry | Admitting: Psychiatry

## 2023-04-04 ENCOUNTER — Ambulatory Visit (INDEPENDENT_AMBULATORY_CARE_PROVIDER_SITE_OTHER): Admitting: Nurse Practitioner

## 2023-04-04 ENCOUNTER — Encounter: Payer: Self-pay | Admitting: Nurse Practitioner

## 2023-04-04 VITALS — BP 135/75 | HR 75 | Ht 68.0 in | Wt 204.0 lb

## 2023-04-04 DIAGNOSIS — F4323 Adjustment disorder with mixed anxiety and depressed mood: Secondary | ICD-10-CM | POA: Insufficient documentation

## 2023-04-04 DIAGNOSIS — F32A Depression, unspecified: Secondary | ICD-10-CM

## 2023-04-04 DIAGNOSIS — Z13220 Encounter for screening for lipoid disorders: Secondary | ICD-10-CM

## 2023-04-04 DIAGNOSIS — Z1329 Encounter for screening for other suspected endocrine disorder: Secondary | ICD-10-CM

## 2023-04-04 DIAGNOSIS — I10 Essential (primary) hypertension: Secondary | ICD-10-CM

## 2023-04-04 DIAGNOSIS — F419 Anxiety disorder, unspecified: Secondary | ICD-10-CM

## 2023-04-04 DIAGNOSIS — Z79899 Other long term (current) drug therapy: Secondary | ICD-10-CM | POA: Insufficient documentation

## 2023-04-04 MED ORDER — DULOXETINE HCL 30 MG PO CPEP: 30.0000 mg | ORAL_CAPSULE | Freq: Every day | ORAL | 0 refills | Status: DC

## 2023-04-04 NOTE — Discharge Instructions (Addendum)
Based on what you have shared, a list of resources for outpatient therapy and psychiatry is provided below to get you started back on treatment.  It is imperative that you follow through with treatment within 5-7 days from the day of discharge to prevent any further risk to your safety or mental well-being.  You are not limited to the list provided.  In case of an urgent crisis, you may contact the Mobile Crisis Unit with [facility] at [phone].        Outpatient Services for Therapy and Medication Management for [facility] [address], [place], [address] [phone] phone  New Patient Assessment/Therapy Walk-ins Monday and Wednesday: 8am until slots are full. Every Monday Wednesday and Thursday -arrive 7:15 am 2cd floor   NO ASSESSMENT/THERAPY WALK-INS ON TUESDAYS OR THURSDAYS  New Patient Psychiatry/Medication Management Walk-ins Monday-Friday: 8am-11am  For all walk-ins, we ask that you arrive by 7:30am because patient will be seen in the order of arrival.  Availability is limited; therefore, you may not be seen on the same day that you walk-in.  Our goal is to serve and meet the needs of our community to the best of our ability.   [facility] [address] [place], [place], [address] [phone] phone  [facility] [address] [place], [place], [address] [phone] phone [phone] phone ([address], Anthem/Elevance, [address], [address], [address], [address], Healthy [place], [place], [place], [address], [employer]oco[employer]illips, [place], UHC, American Financial, [place], Out of Network)  [employer], [place] [address] [place], [place], [address] [phone] phone ([place], Anthem/Elevance, [employer]I Options/Carelon, BCBS, [address], [place], [place], [place], [place], [employer], Max[employer]ys, [place], [place],  [facility])  [facility] [address] [place], [place], [address] [phone] phone (Medicaid, ask about other insurance)  [facility] [address] [place], [place], [address] [phone] phone [phone] fax ([address], [place] , [place], [place], [place] Health Choice, UHC, General Electric, Self-Pay)  [facility] [address] [place], [place], [address] [phone] phone ([address] Anthem/Elevance, [address], [address], [place], [employer]X Corporation, [place], [place], [place], [employer], [place], [place], [place], [facility])  [facility] - 6-8 MONTH WAIT FOR THERAPY; SOONER FOR MEDICATION MANAGEMENT [address] [place], [place], [address] [phone] phone ([address], AmeriHealth [address] - [place], [address], [place], [place], Friday Health Plans, [address], BCBS Healthy [place], [place], [address], [place], [place], [place], [place], Tricare, UHC, Safeco Corporation, [place])  [facility] [address], [address] [place], [place], [address] [phone] phone  [facility] [address] [place], [place], [address] [phone] phone  [facility] [address] [place], [place], [address] [phone] phone  [facility] - THERAPY ONLY [address], [place], [address] [phone] phone  [facility], [place] [address], [place], [address] [phone] phone  [facility] [address] [place], [place], [address] [phone] phone [phone] fax  [facility] [address] [place], [place], [address] [phone] phone [phone] fax  [facility] [phone] [address] [place], [place], [address] [phone] phone  [facility]ov[facility] Kussmaul [address] [place], [place], [address]  [phone] phone  [facility]  (Adults Only) [address] [place], [place], [address]  [phone] phone [phone] fax
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
## 2023-04-04 NOTE — Patient Instructions (Signed)
1. Anxiety and depression  - Ambulatory referral to Psychiatry  2. Essential hypertension  - CBC - Comprehensive metabolic panel  3. Thyroid disorder screen  - Thyroid Panel With TSH  4. Lipid screening  - Lipid Panel  Follow up:  Follow up in 3 months
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
## 2023-04-05 LAB — COMPREHENSIVE METABOLIC PANEL
ALT: 10 [IU]/L (ref 0–32)
AST: 21 [IU]/L (ref 0–40)
Albumin: 4.7 g/dL (ref 3.9–4.9)
Alkaline Phosphatase: 86 [IU]/L (ref 44–121)
BUN/Creatinine Ratio: 14 (ref 9–23)
BUN: 14 mg/dL (ref 6–24)
Bilirubin Total: <0.2 mg/dL (ref 0.0–1.2)
CO2: 21 mmol/L (ref 20–29)
Calcium: 9.6 mg/dL (ref 8.7–10.2)
Chloride: 104 mmol/L (ref 96–106)
Creatinine, Ser: 0.97 mg/dL (ref 0.57–1.00)
Globulin, Total: 2.8 g/dL (ref 1.5–4.5)
Glucose: 75 mg/dL (ref 70–99)
Potassium: 4 mmol/L (ref 3.5–5.2)
Sodium: 141 mmol/L (ref 134–144)
Total Protein: 7.5 g/dL (ref 6.0–8.5)
eGFR: 71 mL/min/{1.73_m2} (ref >59)

## 2023-04-05 LAB — CBC
Hematocrit: 41.5 % (ref 34.0–46.6)
Hemoglobin: 13.9 g/dL (ref 11.1–15.9)
MCH: 32.9 pg (ref 26.6–33.0)
MCHC: 33.5 g/dL (ref 31.5–35.7)
MCV: 98 fL — ABNORMAL HIGH (ref 79–97)
Platelets: 296 10*3/uL (ref 150–450)
RBC: 4.22 x10E6/uL (ref 3.77–5.28)
RDW: 12.6 % (ref 11.7–15.4)
WBC: 5.3 10*3/uL (ref 3.4–10.8)

## 2023-04-05 LAB — LIPID PANEL
Chol/HDL Ratio: 3.5 ratio (ref 0.0–4.4)
Cholesterol, Total: 262 mg/dL — ABNORMAL HIGH (ref 100–199)
HDL: 74 mg/dL (ref >39)
LDL Chol Calc (NIH): 176 mg/dL — ABNORMAL HIGH (ref 0–99)
Triglycerides: 72 mg/dL (ref 0–149)
VLDL Cholesterol Cal: 12 mg/dL (ref 5–40)

## 2023-04-05 LAB — THYROID PANEL WITH TSH
Free Thyroxine Index: 1.2 (ref 1.2–4.9)
T3 Uptake Ratio: 27 % (ref 24–39)
T4, Total: 4.6 ug/dL (ref 4.5–12.0)
TSH: 18 u[IU]/mL — ABNORMAL HIGH (ref 0.450–4.500)

## 2023-04-06 ENCOUNTER — Other Ambulatory Visit: Payer: Self-pay | Admitting: Nurse Practitioner

## 2023-04-06 MED ORDER — ROSUVASTATIN CALCIUM 10 MG PO TABS: 10.0000 mg | ORAL_TABLET | Freq: Every day | ORAL | 11 refills | Status: AC

## 2023-04-06 MED ORDER — LEVOTHYROXINE SODIUM 112 MCG PO TABS: 112.0000 ug | ORAL_TABLET | Freq: Every day | ORAL | 11 refills | Status: DC

## 2023-04-07 ENCOUNTER — Telehealth: Payer: Self-pay | Admitting: Clinical
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
## 2023-04-12 ENCOUNTER — Ambulatory Visit (INDEPENDENT_AMBULATORY_CARE_PROVIDER_SITE_OTHER): Payer: Medicaid Other | Admitting: Licensed Clinical Social Worker

## 2023-04-12 ENCOUNTER — Encounter (HOSPITAL_COMMUNITY): Payer: Self-pay | Admitting: Licensed Clinical Social Worker

## 2023-04-12 DIAGNOSIS — F430 Acute stress reaction: Secondary | ICD-10-CM | POA: Insufficient documentation

## 2023-04-12 DIAGNOSIS — F331 Major depressive disorder, recurrent, moderate: Secondary | ICD-10-CM | POA: Insufficient documentation
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
## 2023-04-16 DIAGNOSIS — R32 Unspecified urinary incontinence: Secondary | ICD-10-CM | POA: Diagnosis not present

## 2023-04-21 ENCOUNTER — Ambulatory Visit: Admitting: Orthopedic Surgery

## 2023-04-27 ENCOUNTER — Encounter: Payer: Self-pay | Admitting: Radiology

## 2023-04-27 ENCOUNTER — Other Ambulatory Visit: Payer: Self-pay

## 2023-04-27 DIAGNOSIS — F32A Depression, unspecified: Secondary | ICD-10-CM

## 2023-04-27 MED ORDER — DULOXETINE HCL 30 MG PO CPEP
30.0000 mg | ORAL_CAPSULE | Freq: Every day | ORAL | 0 refills | Status: DC
Start: 2023-04-27 — End: 2023-04-28

## 2023-04-27 NOTE — Telephone Encounter (Signed)
Please advise K[facility] 

## 2023-04-28 ENCOUNTER — Ambulatory Visit (INDEPENDENT_AMBULATORY_CARE_PROVIDER_SITE_OTHER): Admitting: Physician Assistant

## 2023-04-28 VITALS — BP 141/87 | HR 72 | Ht 67.0 in | Wt 209.2 lb

## 2023-04-28 DIAGNOSIS — F331 Major depressive disorder, recurrent, moderate: Secondary | ICD-10-CM

## 2023-04-28 DIAGNOSIS — F430 Acute stress reaction: Secondary | ICD-10-CM

## 2023-04-28 MED ORDER — SERTRALINE HCL 50 MG PO TABS: 50.0000 mg | ORAL_TABLET | Freq: Every day | ORAL | 1 refills | Status: DC
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
## 2023-05-01 ENCOUNTER — Encounter (HOSPITAL_COMMUNITY): Payer: Self-pay | Admitting: Physician Assistant

## 2023-05-09 ENCOUNTER — Ambulatory Visit: Payer: Self-pay | Admitting: Nurse Practitioner

## 2023-05-11 ENCOUNTER — Ambulatory Visit (HOSPITAL_COMMUNITY): Payer: Medicaid Other | Admitting: Physician Assistant

## 2023-05-18 ENCOUNTER — Ambulatory Visit: Admitting: Orthopedic Surgery

## 2023-05-26 ENCOUNTER — Ambulatory Visit (HOSPITAL_COMMUNITY): Payer: Medicaid Other | Admitting: Licensed Clinical Social Worker

## 2023-05-26 DIAGNOSIS — F430 Acute stress reaction: Secondary | ICD-10-CM

## 2023-05-26 DIAGNOSIS — F331 Major depressive disorder, recurrent, moderate: Secondary | ICD-10-CM
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
## 2023-05-27 ENCOUNTER — Ambulatory Visit (INDEPENDENT_AMBULATORY_CARE_PROVIDER_SITE_OTHER): Admitting: Nurse Practitioner

## 2023-05-27 VITALS — BP 133/78 | HR 78 | Temp 97.3°F | Wt 209.4 lb

## 2023-05-27 DIAGNOSIS — E049 Nontoxic goiter, unspecified: Secondary | ICD-10-CM | POA: Diagnosis not present

## 2023-05-27 DIAGNOSIS — E039 Hypothyroidism, unspecified: Secondary | ICD-10-CM
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
## 2023-05-27 NOTE — Patient Instructions (Addendum)
1. Hypothyroidism, unspecified type (Primary)  - Thyroid Panel With TSH - Ambulatory referral to Endocrinology   2. Goiter  - Ambulatory referral to Endocrinology    Follow up:  Follow up in 3 months

## 2023-05-28 LAB — THYROID PANEL WITH TSH
Free Thyroxine Index: 1.5 (ref 1.2–4.9)
T3 Uptake Ratio: 26 % (ref 24–39)
T4, Total: 5.9 ug/dL (ref 4.5–12.0)
TSH: 17.2 u[IU]/mL — ABNORMAL HIGH (ref 0.450–4.500)

## 2023-06-01 ENCOUNTER — Other Ambulatory Visit: Payer: Self-pay | Admitting: Medical Genetics

## 2023-06-02 ENCOUNTER — Other Ambulatory Visit: Payer: Self-pay | Admitting: Neurology

## 2023-06-02 ENCOUNTER — Other Ambulatory Visit: Payer: Self-pay | Admitting: Nurse Practitioner

## 2023-06-02 ENCOUNTER — Telehealth: Payer: Self-pay | Admitting: Neurology

## 2023-06-02 DIAGNOSIS — G43109 Migraine with aura, not intractable, without status migrainosus: Secondary | ICD-10-CM

## 2023-06-02 MED ORDER — LEVOTHYROXINE SODIUM 125 MCG PO TABS
125.0000 ug | ORAL_TABLET | Freq: Every day | ORAL | 11 refills | Status: AC
Start: 2023-06-02 — End: 2024-06-01
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
## 2023-06-03 NOTE — Telephone Encounter (Signed)
Front desk called and left message to call us back for an appointment per Dr. Loleta Chance

## 2023-06-03 NOTE — Telephone Encounter (Signed)
Pt called and is on the waiting list.

## 2023-06-14 ENCOUNTER — Telehealth (HOSPITAL_COMMUNITY): Payer: Self-pay | Admitting: Licensed Clinical Social Worker

## 2023-06-14 ENCOUNTER — Ambulatory Visit (HOSPITAL_COMMUNITY): Admitting: Licensed Clinical Social Worker

## 2023-06-14 NOTE — Telephone Encounter (Signed)
 LCSW called patient and 11:10 AM to remind her of in person appointment for today at 11 AM.  LCSW was going to offer patient a virtual appointment if she had forgotten that the appointment was in person but patient did not answer and message received through phone number called was call cannot be completed as dialed.

## 2023-06-16 ENCOUNTER — Encounter (HOSPITAL_COMMUNITY): Admitting: Physician Assistant

## 2023-06-16 ENCOUNTER — Encounter (HOSPITAL_COMMUNITY): Payer: Self-pay

## 2023-06-25 DIAGNOSIS — R32 Unspecified urinary incontinence: Secondary | ICD-10-CM | POA: Diagnosis not present

## 2023-06-29 ENCOUNTER — Other Ambulatory Visit: Payer: Self-pay | Admitting: Neurology

## 2023-06-29 DIAGNOSIS — G43109 Migraine with aura, not intractable, without status migrainosus: Secondary | ICD-10-CM

## 2023-07-05 ENCOUNTER — Other Ambulatory Visit (HOSPITAL_COMMUNITY): Payer: Medicaid Other | Attending: Medical Genetics

## 2023-07-08 ENCOUNTER — Ambulatory Visit (INDEPENDENT_AMBULATORY_CARE_PROVIDER_SITE_OTHER): Admitting: Licensed Clinical Social Worker

## 2023-07-08 DIAGNOSIS — F331 Major depressive disorder, recurrent, moderate: Secondary | ICD-10-CM | POA: Diagnosis not present

## 2023-07-08 DIAGNOSIS — F430 Acute stress reaction: Secondary | ICD-10-CM
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
## 2023-07-22 ENCOUNTER — Other Ambulatory Visit: Payer: Self-pay | Admitting: Nurse Practitioner

## 2023-07-22 DIAGNOSIS — R000 Tachycardia, unspecified: Secondary | ICD-10-CM

## 2023-07-22 MED ORDER — METOPROLOL SUCCINATE ER 25 MG PO TB24
12.5000 mg | ORAL_TABLET | Freq: Every day | ORAL | 0 refills | Status: DC
Start: 2023-07-22 — End: 2023-10-27

## 2023-07-25 ENCOUNTER — Telehealth (HOSPITAL_COMMUNITY): Payer: Self-pay | Admitting: Licensed Clinical Social Worker

## 2023-07-25 NOTE — Telephone Encounter (Signed)
LCSW attempted to call patient to reschedule appointment on March 28.  LCSW got a message stating "call cannot be completed as dialed".  LCSW will notify patient of reschedule appointment of March 28 to April 4 at 11 AM at next appointment on March 7.

## 2023-08-04 ENCOUNTER — Telehealth (HOSPITAL_COMMUNITY): Payer: Self-pay

## 2023-08-04 NOTE — Telephone Encounter (Signed)
 Hello,     Social worker called on behalf of this Pt to seek she  Compliant with treatment plans and therapy appointments she wants to know how pt is doing overall.  If you guys could please give her a call back. Stated that she does have authorized release of info.

## 2023-08-04 NOTE — Telephone Encounter (Addendum)
 Sorry, I thought I had listed it under contacts however,    Name of social worker is Danny Lawless booker  Phone # is [phone]

## 2023-08-08 NOTE — Telephone Encounter (Signed)
 Hello,    The social Worker called back this afternoon stating that she has sent in a release of information this past November, but has released and signed a new one as of today, so I'm guessing just be on the look out for this form to get uploaded to then get back in contact with her for the pt.

## 2023-08-11 ENCOUNTER — Other Ambulatory Visit: Payer: Self-pay | Admitting: Neurology

## 2023-08-11 DIAGNOSIS — G43109 Migraine with aura, not intractable, without status migrainosus: Secondary | ICD-10-CM

## 2023-08-12 ENCOUNTER — Other Ambulatory Visit (HOSPITAL_COMMUNITY): Payer: Self-pay | Admitting: Physician Assistant

## 2023-08-12 ENCOUNTER — Ambulatory Visit (HOSPITAL_COMMUNITY): Admitting: Licensed Clinical Social Worker

## 2023-08-12 DIAGNOSIS — F430 Acute stress reaction: Secondary | ICD-10-CM

## 2023-08-12 DIAGNOSIS — F331 Major depressive disorder, recurrent, moderate: Secondary | ICD-10-CM | POA: Diagnosis not present
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
## 2023-08-25 ENCOUNTER — Ambulatory Visit: Admitting: Nurse Practitioner

## 2023-08-31 ENCOUNTER — Encounter (HOSPITAL_COMMUNITY): Payer: Self-pay | Admitting: Physician Assistant

## 2023-08-31 ENCOUNTER — Telehealth (HOSPITAL_COMMUNITY): Admitting: Physician Assistant

## 2023-08-31 ENCOUNTER — Encounter: Payer: Self-pay | Admitting: Neurology

## 2023-08-31 DIAGNOSIS — F430 Acute stress reaction: Secondary | ICD-10-CM | POA: Diagnosis not present

## 2023-08-31 DIAGNOSIS — F331 Major depressive disorder, recurrent, moderate: Secondary | ICD-10-CM | POA: Diagnosis not present

## 2023-08-31 MED ORDER — SERTRALINE HCL 50 MG PO TABS: 50.0000 mg | ORAL_TABLET | Freq: Every day | ORAL | 2 refills | Status: DC

## 2023-08-31 NOTE — Progress Notes (Signed)
 [facility] MD/PA/NP OP Progress Note  Virtual Visit via Video Note  I connected with Jennifer Forbes on 08/31/23 at  1:30 PM EDT by a video enabled telemedicine application and verified that I am speaking with the correct person using two identifiers.  Location: Patient: Home Provider: Clinic   I discussed the limitations of evaluation and management by telemedicine and the availability of in person appointments. The patient expressed understanding and agreed to proceed.  Follow Up Instructions:  I discussed the assessment and treatment plan with the patient. The patient was provided an opportunity to ask questions and all were answered. The patient agreed with the plan and demonstrated an understanding of the instructions.   The patient was advised to call back or seek an in-person evaluation if the symptoms worsen or if the condition fails to improve as anticipated.  I provided 13 minutes of non-face-to-face time during this encounter.  Meta Hatchet, PA    08/31/2023 7:23 PM Jennifer Forbes  MRN:  [id number]  Chief Complaint:  Chief Complaint  Patient presents with   Follow-up   Medication Refill   HPI:   Jennifer Forbes. Jennifer Forbes is a 53 year old female with a past psychiatric history significant for major depressive disorder (recurrent episode, moderate) and acute stress disorder who presents to [facility] for follow-up and medication management.  Patient is currently being managed on the following psychiatric medication: Sertraline 50 mg daily.  Patient reports that she had issues with the medication when taking it in the evening.  She reports that when she took the medication in the evening, she would end up staying up the whole night.  Patient reports that she has since started taking the medication in the morning and has had no issues.  Patient reports that when she is out of her medication, she gets very irritable and angry.  Since taking the  medication, patient endorses improved mood.  Patient rates her depression a 5 out of 10 with 10 being most severe.  Patient endorses depressive episodes 1 to 2 days out of the week.  Patient endorses the following depressive symptoms: irritability, crying spells, and rumination.  Patient also states that she experiences flashbacks related to past traumatic events in her life.  Since taking sertraline, patient reports that the flashbacks have decreased.  Patient denies experiencing anxiety.  A GAD-7 screen was performed with the patient scoring a 4.  Patient is alert and oriented x 4, pleasant, calm, cooperative, and fully engaged in conversation during the encounter.  Patient endorses happy mood.  Patient exhibits euthymic mood with appropriate affect.  Patient denies suicidal or homicidal ideations.  She further denies auditory or visual hallucinations and does not appear to be responding to internal/external stimuli.  Patient endorses good sleep and receives on average 8 hours of sleep per night.  Patient endorses good appetite and eats on average 3 meals per day.  Patient denies alcohol consumption or illicit drug use.  Patient denies tobacco use but does engage in vaping.  Visit Diagnosis:    ICD-10-CM   1. Major depressive disorder, recurrent episode, moderate (HCC)  F33.1 sertraline (ZOLOFT) 50 MG tablet    2. Acute stress disorder  F43.0 sertraline (ZOLOFT) 50 MG tablet      Past Psychiatric History:  Patient was recently assessed at [facility] and was given a diagnosis of adjustment disorder (with mixed anxiety and depressed mood)   Patient denies a past history of  hospitalization due to mental health.   Patient denies a past history of suicide attempt.   Patient denies a past history of homicide attempt.  Past Medical History:  Past Medical History:  Diagnosis Date   Anxiety    Arthritis    Left knee   GERD (gastroesophageal reflux disease)     Headache    Hypertension    Pneumonia    Tachycardia    Per patient    Past Surgical History:  Procedure Laterality Date   BREAST BIOPSY  2015   left breast   BREAST LUMPECTOMY WITH RADIOACTIVE SEED LOCALIZATION Left 10/01/2021   Procedure: LEFT BREAST LUMPECTOMY WITH RADIOACTIVE SEED LOCALIZATION;  Surgeon: Griselda Miner, MD;  Location: [facility] OR;  Service: General;  Laterality: Left;   LIPOMA RESECTION  march 2014   right shoulder    SKIN BIOPSY     THYROIDECTOMY N/A 08/24/2022   Procedure: TOTAL THYROIDECTOMY;  Surgeon: Darnell Level, MD;  Location: [facility] ORS;  Service: General;  Laterality: N/A;    Family Psychiatric History:  Patient denies a family history of psychiatric illness   Family history of suicide attempt: Patient denies Family history of homicide attempt: Patient denies Family history of substance abuse: Patient denies  Family History:  Family History  Problem Relation Age of Onset   Hypertension Mother    Hypertension Father    Heart attack Father    Stroke Father    Breast cancer Sister    Diabetes Brother    Breast cancer Maternal Aunt    Diabetes Maternal Uncle     Social History:  Social History   Socioeconomic History   Marital status: Legally Separated    Spouse name: Not on file   Number of children: 1   Years of education: Not on file   Highest education level: Some college, no degree  Occupational History   Not on file  Tobacco Use   Smoking status: Every Day    Types: E-cigarettes   Smokeless tobacco: Never   Tobacco comments:    black and mild smokes about 3 per day  Vaping Use   Vaping status: Never Used  Substance and Sexual Activity   Alcohol use: Yes    Comment: Occasional Drink   Drug use: Not Currently   Sexual activity: Not Currently    Birth control/protection: None  Other Topics Concern   Not on file  Social History Narrative   Right Handed    Lives in a two story home   Social Drivers of Health   Financial Resource  Strain: High Risk (05/27/2023)   Overall Financial Resource Strain (CARDIA)    Difficulty of Paying Living Expenses: Very hard  Food Insecurity: Food Insecurity Present (05/27/2023)   Hunger Vital Sign    Worried About Running Out of Food in the Last Year: Often true    Ran Out of Food in the Last Year: Sometimes true  Transportation Needs: No Transportation Needs (05/27/2023)   PRAPARE - Administrator, Civil Service (Medical): No    Lack of Transportation (Non-Medical): No  Physical Activity: Inactive (05/27/2023)   Exercise Vital Sign    Days of Exercise per Week: 0 days    Minutes of Exercise per Session: 0 min  Stress: Stress Concern Present (05/27/2023)   [employer] of Occupational Health - Occupational Stress Questionnaire    Feeling of Stress : Very much  Social Connections: Socially Isolated (05/27/2023)   Social Connection and Isolation Panel [NHANES]  Frequency of Communication with Friends and Family: More than three times a week    Frequency of Social Gatherings with Friends and Family: Twice a week    Attends Religious Services: Never    Database administrator or Organizations: No    Attends Banker Meetings: Never    Marital Status: Separated    Allergies:  Allergies  Allergen Reactions   Amoxicillin Rash    Metabolic Disorder Labs: Lab Results  Component Value Date   HGBA1C 5.3 05/06/2017   MPG 108 10/22/2015   No results found for: "PROLACTIN" Lab Results  Component Value Date   CHOL 262 (H) 04/04/2023   TRIG 72 04/04/2023   HDL 74 04/04/2023   CHOLHDL 3.5 04/04/2023   VLDL 19 10/22/2015   LDLCALC 176 (H) 04/04/2023   LDLCALC 101 (H) 01/15/2022   Lab Results  Component Value Date   TSH 17.200 (H) 05/27/2023   TSH 18.000 (H) 04/04/2023    Therapeutic Level Labs: No results found for: "LITHIUM" No results found for: "VALPROATE" No results found for: "CBMZ"  Current Medications: Current Outpatient  Medications  Medication Sig Dispense Refill   amLODipine (NORVASC) 5 MG tablet Take 1 tablet (5 mg total) by mouth daily. 90 tablet 0   Aspirin-Salicylamide-Caffeine (BC HEADACHE POWDER PO) Take 1-2 packets by mouth every 4 (four) hours as needed (migraines).     ciclopirox (LOPROX) 0.77 % cream Apply topically 2 (two) times daily. (Patient not taking: Reported on 05/27/2023) 15 g 0   diclofenac (VOLTAREN) 75 MG EC tablet Take 1 tablet (75 mg total) by mouth 2 (two) times daily. (Patient not taking: Reported on 05/27/2023) 30 tablet 0   levothyroxine (SYNTHROID) 125 MCG tablet Take 1 tablet (125 mcg total) by mouth daily. 30 tablet 11   meclizine (ANTIVERT) 12.5 MG tablet Take 1 tablet (12.5 mg total) by mouth 3 (three) times daily as needed for dizziness. (Patient not taking: Reported on 05/27/2023) 30 tablet 0   metoprolol succinate (TOPROL-XL) 25 MG 24 hr tablet Take 0.5 tablets (12.5 mg total) by mouth daily. 45 tablet 0   ondansetron (ZOFRAN-ODT) 4 MG disintegrating tablet Take 1 tablet (4 mg total) by mouth every 8 (eight) hours as needed for nausea or vomiting. (Patient not taking: Reported on 05/27/2023) 20 tablet 0   pantoprazole (PROTONIX) 40 MG tablet Take 1 tablet (40 mg total) by mouth daily. 90 tablet 3   rosuvastatin (CRESTOR) 10 MG tablet Take 1 tablet (10 mg total) by mouth daily. 30 tablet 11   sertraline (ZOLOFT) 50 MG tablet Take 1 tablet (50 mg total) by mouth daily. 30 tablet 2   SUMAtriptan (IMITREX) 100 MG tablet Take 1 tablet (100 mg total) by mouth once as needed for up to 1 dose for migraine. May repeat in 2 hours if headache persists or recurs. 10 tablet 5   topiramate (TOPAMAX) 25 MG tablet Take 1 tablet by mouth once daily 30 tablet 1   No current facility-administered medications for this visit.     Musculoskeletal: Strength & Muscle Tone: within normal limits Gait & Station: normal Patient leans: N/A  Psychiatric Specialty Exam: Review of Systems   Psychiatric/Behavioral:  Negative for decreased concentration, dysphoric mood, hallucinations, self-injury, sleep disturbance and suicidal ideas. The patient is not nervous/anxious and is not hyperactive.     Last menstrual period 01/20/2019.There is no height or weight on file to calculate BMI.  General Appearance: Casual  Eye Contact:  Good  Speech:  Clear and Coherent and Normal Rate  Volume:  Normal  Mood:  Euthymic  Affect:  Appropriate  Thought Process:  Coherent, Goal Directed, and Descriptions of Associations: Intact  Orientation:  Full (Time, Place, and Person)  Thought Content: WDL   Suicidal Thoughts:  No  Homicidal Thoughts:  No  Memory:  Immediate;   Good Recent;   Good Remote;   Good  Judgement:  Good  Insight:  Good  Psychomotor Activity:  Normal  Concentration:  Concentration: Good and Attention Span: Good  Recall:  Good  Fund of Knowledge: Good  Language: Good  Akathisia:  No  Handed:  Right  AIMS (if indicated): not done  Assets:  Communication Skills Desire for Improvement Housing Social Support Transportation  ADL's:  Intact  Cognition: WNL  Sleep:  Good   Screenings: GAD-7    Flowsheet Row Video Visit from 08/31/2023 in [facility] Office Visit from 04/28/2023 in [facility] Counselor from 04/12/2023 in [facility]  Total GAD-7 Score 4 12 14       PHQ2-9    Flowsheet Row Video Visit from 08/31/2023 in [facility] Office Visit from 04/28/2023 in [facility] Counselor from 04/12/2023 in [facility] Office Visit from 11/04/2022 in [facility] Dept Of Eligha [facility] Office Visit from 07/15/2022 in [facility] - [facility] Dept Of [facility]  PHQ-2 Total Score 1 3 3  0 6  PHQ-9 Total Score -- 14 17 -- 10      Flowsheet  Row Video Visit from 08/31/2023 in [facility] Office Visit from 04/28/2023 in [facility] Counselor from 04/12/2023 in [facility]  C-SSRS RISK CATEGORY No Risk No Risk No Risk        Assessment and Plan:   Jennifer Forbes is a 53 year old female with a past psychiatric history significant for major depressive disorder (recurrent episode, moderate) and acute stress disorder who presents to [facility] for follow-up and medication management.  Patient reports that she has been experiencing improved mood since being on sertraline.  Without her medication, patient reports that she experiences worsening mood as well as flashbacks.  Though patient endorses some depression, she reports that it has been greatly reduced since being on sertraline.  Patient denies anxiety.  Patient endorses stability on her current medication regimen and would like to continue taking her medication as prescribed.  Patient's medication to be e-prescribed to pharmacy of choice.  Collaboration of Care: Collaboration of Care: Medication Management AEB provider managing patient's psychiatric medication, Primary Care Provider AEB patient being seen by a primary care provider (internal medicine), Psychiatrist AEB patient being seen by a psychiatric provider at this facility, Other provider involved in patient's care AEB patient being seen by neurology, and Referral or follow-up with counselor/therapist AEB patient being seen by licensed medical social worker at this facility  Patient/Guardian was advised Release of Information must be obtained prior to any record release in order to collaborate their care with an outside provider. Patient/Guardian was advised if they have not already done so to contact the registration department to sign all necessary forms in order for [place] to release information regarding their  care.   Consent: Patient/Guardian gives verbal consent for treatment and assignment of benefits for services provided  during this visit. Patient/Guardian expressed understanding and agreed to proceed.   1. Major depressive disorder, recurrent episode, moderate (HCC)  - sertraline (ZOLOFT) 50 MG tablet; Take 1 tablet (50 mg total) by mouth daily.  Dispense: 30 tablet; Refill: 2  2. Acute stress disorder  - sertraline (ZOLOFT) 50 MG tablet; Take 1 tablet (50 mg total) by mouth daily.  Dispense: 30 tablet; Refill: 2  Patient to follow up in 2 months Provider spent a total of 13 minutes with the patient/reviewing patient's chart  Meta Hatchet, PA 08/31/2023, 7:23 PM

## 2023-09-02 ENCOUNTER — Ambulatory Visit (HOSPITAL_COMMUNITY): Admitting: Licensed Clinical Social Worker

## 2023-09-09 ENCOUNTER — Encounter (HOSPITAL_COMMUNITY): Payer: Self-pay

## 2023-09-09 ENCOUNTER — Ambulatory Visit (HOSPITAL_COMMUNITY): Admitting: Licensed Clinical Social Worker

## 2023-09-21 DIAGNOSIS — R32 Unspecified urinary incontinence: Secondary | ICD-10-CM | POA: Diagnosis not present

## 2023-09-24 ENCOUNTER — Other Ambulatory Visit: Payer: Self-pay | Admitting: Nurse Practitioner

## 2023-09-24 DIAGNOSIS — I10 Essential (primary) hypertension: Secondary | ICD-10-CM
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
## 2023-09-30 ENCOUNTER — Ambulatory Visit (HOSPITAL_COMMUNITY): Admitting: Licensed Clinical Social Worker

## 2023-09-30 DIAGNOSIS — F430 Acute stress reaction: Secondary | ICD-10-CM | POA: Diagnosis not present
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
## 2023-10-06 ENCOUNTER — Ambulatory Visit: Admitting: Neurology

## 2023-10-12 ENCOUNTER — Ambulatory Visit: Admitting: Neurology

## 2023-10-12 ENCOUNTER — Encounter: Payer: Self-pay | Admitting: Neurology

## 2023-10-27 ENCOUNTER — Other Ambulatory Visit: Payer: Self-pay | Admitting: Nurse Practitioner

## 2023-10-27 ENCOUNTER — Other Ambulatory Visit: Payer: Self-pay | Admitting: Neurology

## 2023-10-27 DIAGNOSIS — G43109 Migraine with aura, not intractable, without status migrainosus: Secondary | ICD-10-CM

## 2023-10-27 DIAGNOSIS — R000 Tachycardia, unspecified: Secondary | ICD-10-CM

## 2023-11-02 ENCOUNTER — Telehealth (HOSPITAL_COMMUNITY): Admitting: Physician Assistant

## 2023-11-02 ENCOUNTER — Encounter (HOSPITAL_COMMUNITY): Payer: Self-pay | Admitting: Physician Assistant

## 2023-11-02 DIAGNOSIS — F331 Major depressive disorder, recurrent, moderate: Secondary | ICD-10-CM

## 2023-11-02 DIAGNOSIS — F430 Acute stress reaction: Secondary | ICD-10-CM | POA: Diagnosis not present

## 2023-11-02 MED ORDER — SERTRALINE HCL 100 MG PO TABS: 100.0000 mg | ORAL_TABLET | Freq: Every day | ORAL | 2 refills | Status: DC
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
## 2023-11-04 ENCOUNTER — Telehealth (HOSPITAL_COMMUNITY): Payer: Self-pay | Admitting: Licensed Clinical Social Worker

## 2023-11-04 ENCOUNTER — Ambulatory Visit (HOSPITAL_COMMUNITY): Admitting: Licensed Clinical Social Worker

## 2023-11-04 NOTE — Telephone Encounter (Signed)
 LCSW sent link at [phone] with no response. LCSW called pt at 0810 and phone listed in epic went straight to VM. LCSW left HIPAA compliant VM and waited until 0815 before disconnecting.

## 2023-12-18 DIAGNOSIS — R32 Unspecified urinary incontinence: Secondary | ICD-10-CM | POA: Diagnosis not present

## 2023-12-22 ENCOUNTER — Telehealth (INDEPENDENT_AMBULATORY_CARE_PROVIDER_SITE_OTHER): Admitting: Nurse Practitioner

## 2023-12-22 VITALS — Ht 67.0 in | Wt 209.0 lb

## 2023-12-22 DIAGNOSIS — Z1211 Encounter for screening for malignant neoplasm of colon: Secondary | ICD-10-CM

## 2023-12-22 DIAGNOSIS — G8929 Other chronic pain: Secondary | ICD-10-CM | POA: Diagnosis not present

## 2023-12-22 DIAGNOSIS — M545 Low back pain, unspecified: Secondary | ICD-10-CM | POA: Diagnosis not present

## 2023-12-22 DIAGNOSIS — G43109 Migraine with aura, not intractable, without status migrainosus: Secondary | ICD-10-CM

## 2023-12-22 MED ORDER — SUMATRIPTAN SUCCINATE 100 MG PO TABS: 100.0000 mg | ORAL_TABLET | Freq: Once | ORAL | 5 refills | Status: AC | PRN

## 2023-12-22 MED ORDER — CYCLOBENZAPRINE HCL 10 MG PO TABS
10.0000 mg | ORAL_TABLET | Freq: Three times a day (TID) | ORAL | 0 refills | Status: AC | PRN
Start: 2023-12-22 — End: ?

## 2023-12-22 MED ORDER — TOPIRAMATE 25 MG PO TABS: 25.0000 mg | ORAL_TABLET | Freq: Every day | ORAL | 1 refills | Status: DC
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
## 2024-01-04 ENCOUNTER — Encounter (HOSPITAL_COMMUNITY): Payer: Self-pay

## 2024-01-04 ENCOUNTER — Telehealth (HOSPITAL_COMMUNITY): Admitting: Physician Assistant

## 2024-01-12 ENCOUNTER — Telehealth (HOSPITAL_COMMUNITY): Admitting: Physician Assistant

## 2024-01-12 ENCOUNTER — Encounter (HOSPITAL_COMMUNITY): Payer: Self-pay | Admitting: Physician Assistant

## 2024-01-12 DIAGNOSIS — F331 Major depressive disorder, recurrent, moderate: Secondary | ICD-10-CM

## 2024-01-12 DIAGNOSIS — F430 Acute stress reaction: Secondary | ICD-10-CM

## 2024-01-12 MED ORDER — SERTRALINE HCL 50 MG PO TABS
150.0000 mg | ORAL_TABLET | Freq: Every day | ORAL | 1 refills | Status: AC
Start: 2024-01-12 — End: ?

## 2024-01-12 NOTE — Progress Notes (Signed)
 [facility] MD/PA/NP OP Progress Note  Virtual Visit via Video Note  I connected with Jennifer Forbes on 01/12/24 at  2:30 PM EDT by a video enabled telemedicine application and verified that I am speaking with the correct person using two identifiers.  Location: Patient: Home Provider: Clinic   I discussed the limitations of evaluation and management by telemedicine and the availability of in person appointments. The patient expressed understanding and agreed to proceed.  Follow Up Instructions:  I discussed the assessment and treatment plan with the patient. The patient was provided an opportunity to ask questions and all were answered. The patient agreed with the plan and demonstrated an understanding of the instructions.   The patient was advised to call back or seek an in-person evaluation if the symptoms worsen or if the condition fails to improve as anticipated.  I provided 12 minutes of non-face-to-face time during this encounter.  Jennifer Forbes Bolster, PA    01/12/2024 10:15 PM Jennifer Forbes  MRN:  [id number]  Chief Complaint:  Chief Complaint  Patient presents with   Follow-up   Medication Management   HPI:   Jennifer Forbes. Lindenbaum is a 53 year old female with a past psychiatric history significant for major depressive disorder (recurrent episode, moderate) and acute stress disorder who presents to [facility]  via virtual video visit for follow-up and medication management.  Patient is currently being managed on the following psychiatric medication: Sertraline  100 mg daily.  Patient presents to the encounter stating that she continues to take her sertraline  regularly.  Despite taking her medications regularly, patient has been experiencing worsening depression and anxiety as of late.  Patient attributes her depression and anxiety to recently having to file charges against her husband for not paying the bills for the house that she is currently living  in.  Due to the bills not being paid, patient reports that the house is currently being foreclosed.  Patient reports that she does not feel that her medication is helping at all.  A PHQ-9 screen was performed with the patient scoring a 15.  A GAD-7 screen was also performed with the patient scoring a 19.  Patient is alert and oriented x 4, pleasant, calm, cooperative, and fully engaged in conversation during the encounter.  Patient endorses depressed mood.  Patient exhibits depressed mood with congruent affect.  Patient denies suicidal or homicidal ideation.  She further denies auditory or visual hallucinations and does not appear to be responding to internal/external stimuli.  Patient endorses poor sleep and receives on average 4 hours of sleep per night.  Patient endorses poor appetite and eats on average 1 meal per day.  Patient denies alcohol consumption or illicit drug use.  Patient denies tobacco use but does engage in vaping.  Visit Diagnosis:    ICD-10-CM   1. Major depressive disorder, recurrent episode, moderate (HCC)  F33.1 sertraline  (ZOLOFT ) 50 MG tablet    2. Acute stress disorder  F43.0 sertraline  (ZOLOFT ) 50 MG tablet      Past Psychiatric History:  Patient was recently assessed at [facility] and was given a diagnosis of adjustment disorder (with mixed anxiety and depressed mood)   Patient denies a past history of hospitalization due to mental health.   Patient denies a past history of suicide attempt.   Patient denies a past history of homicide attempt.  Past Medical History:  Past Medical History:  Diagnosis Date   Anxiety  Arthritis    Left knee   GERD (gastroesophageal reflux disease)    Headache    Hypertension    Pneumonia    Tachycardia    Per patient    Past Surgical History:  Procedure Laterality Date   BREAST BIOPSY  2015   left breast   BREAST LUMPECTOMY WITH RADIOACTIVE SEED LOCALIZATION Left 10/01/2021    Procedure: LEFT BREAST LUMPECTOMY WITH RADIOACTIVE SEED LOCALIZATION;  Surgeon: Curvin Deward MOULD, MD;  Location: [facility] OR;  Service: General;  Laterality: Left;   LIPOMA RESECTION  march 2014   right shoulder    SKIN BIOPSY     THYROIDECTOMY N/A 08/24/2022   Procedure: TOTAL THYROIDECTOMY;  Surgeon: Eletha Boas, MD;  Location: [facility] ORS;  Service: General;  Laterality: N/A;    Family Psychiatric History:  Patient denies a family history of psychiatric illness   Family history of suicide attempt: Patient denies Family history of homicide attempt: Patient denies Family history of substance abuse: Patient denies  Family History:  Family History  Problem Relation Age of Onset   Hypertension Mother    Hypertension Father    Heart attack Father    Stroke Father    Heart disease Father    Breast cancer Sister    Diabetes Brother    Breast cancer Maternal Aunt    Diabetes Maternal Uncle     Social History:  Social History   Socioeconomic History   Marital status: Legally Separated    Spouse name: Not on file   Number of children: 1   Years of education: Not on file   Highest education level: Associate degree: occupational, Scientist, product/process development, or vocational program  Occupational History   Not on file  Tobacco Use   Smoking status: Every Day    Types: E-cigarettes   Smokeless tobacco: Never   Tobacco comments:    black and mild smokes about 3 per day  Vaping Use   Vaping status: Never Used  Substance and Sexual Activity   Alcohol use: Yes    Comment: Occasional Drink   Drug use: Not Currently   Sexual activity: Not Currently    Birth control/protection: None  Other Topics Concern   Not on file  Social History Narrative   Right Handed    Lives in a two story home   Social Drivers of Health   Financial Resource Strain: High Risk (12/22/2023)   Overall Financial Resource Strain (CARDIA)    Difficulty of Paying Living Expenses: Very hard  Food Insecurity: Food Insecurity Present  (12/22/2023)   Hunger Vital Sign    Worried About Running Out of Food in the Last Year: Never true    Ran Out of Food in the Last Year: Often true  Transportation Needs: Unmet Transportation Needs (12/22/2023)   PRAPARE - Transportation    Lack of Transportation (Medical): Yes    Lack of Transportation (Non-Medical): Yes  Physical Activity: Insufficiently Active (12/22/2023)   Exercise Vital Sign    Days of Exercise per Week: 5 days    Minutes of Exercise per Session: 10 min  Stress: Stress Concern Present (12/22/2023)   [employer] of Occupational Health - Occupational Stress Questionnaire    Feeling of Stress: Very much  Social Connections: Socially Isolated (12/22/2023)   Social Connection and Isolation Panel    Frequency of Communication with Friends and Family: More than three times a week    Frequency of Social Gatherings with Friends and Family: Never    Attends Religious Services:  Never    Active Member of Clubs or Organizations: No    Attends Banker Meetings: Not on file    Marital Status: Separated    Allergies:  Allergies  Allergen Reactions   Amoxicillin Rash    Metabolic Disorder Labs: Lab Results  Component Value Date   HGBA1C 5.3 05/06/2017   MPG 108 10/22/2015   No results found for: PROLACTIN Lab Results  Component Value Date   CHOL 262 (H) 04/04/2023   TRIG 72 04/04/2023   HDL 74 04/04/2023   CHOLHDL 3.5 04/04/2023   VLDL 19 10/22/2015   LDLCALC 176 (H) 04/04/2023   LDLCALC 101 (H) 01/15/2022   Lab Results  Component Value Date   TSH 17.200 (H) 05/27/2023   TSH 18.000 (H) 04/04/2023    Therapeutic Level Labs: No results found for: LITHIUM No results found for: VALPROATE No results found for: CBMZ  Current Medications: Current Outpatient Medications  Medication Sig Dispense Refill   amLODipine  (NORVASC ) 5 MG tablet Take 1 tablet by mouth once daily 90 tablet 0   Aspirin-Salicylamide-Caffeine (BC HEADACHE POWDER PO)  Take 1-2 packets by mouth every 4 (four) hours as needed (migraines).     ciclopirox  (LOPROX ) 0.77 % cream Apply topically 2 (two) times daily. (Patient not taking: Reported on 05/27/2023) 15 g 0   cyclobenzaprine  (FLEXERIL ) 10 MG tablet Take 1 tablet (10 mg total) by mouth 3 (three) times daily as needed for muscle spasms. 30 tablet 0   diclofenac  (VOLTAREN ) 75 MG EC tablet Take 1 tablet (75 mg total) by mouth 2 (two) times daily. (Patient not taking: Reported on 05/27/2023) 30 tablet 0   levothyroxine  (SYNTHROID ) 125 MCG tablet Take 1 tablet (125 mcg total) by mouth daily. 30 tablet 11   meclizine  (ANTIVERT ) 12.5 MG tablet Take 1 tablet (12.5 mg total) by mouth 3 (three) times daily as needed for dizziness. (Patient not taking: Reported on 05/27/2023) 30 tablet 0   metoprolol  succinate (TOPROL -XL) 25 MG 24 hr tablet Take 1/2 (one-half) tablet by mouth once daily 45 tablet 0   ondansetron  (ZOFRAN -ODT) 4 MG disintegrating tablet Take 1 tablet (4 mg total) by mouth every 8 (eight) hours as needed for nausea or vomiting. (Patient not taking: Reported on 05/27/2023) 20 tablet 0   pantoprazole  (PROTONIX ) 40 MG tablet Take 1 tablet (40 mg total) by mouth daily. 90 tablet 3   rosuvastatin  (CRESTOR ) 10 MG tablet Take 1 tablet (10 mg total) by mouth daily. 30 tablet 11   sertraline  (ZOLOFT ) 50 MG tablet Take 3 tablets (150 mg total) by mouth daily. 90 tablet 1   SUMAtriptan  (IMITREX ) 100 MG tablet Take 1 tablet (100 mg total) by mouth once as needed for up to 1 dose for migraine. May repeat in 2 hours if headache persists or recurs. 10 tablet 5   topiramate  (TOPAMAX ) 25 MG tablet Take 1 tablet (25 mg total) by mouth daily. 30 tablet 1   No current facility-administered medications for this visit.     Musculoskeletal: Strength & Muscle Tone: within normal limits Gait & Station: normal Patient leans: N/A  Psychiatric Specialty Exam: Review of Systems  Psychiatric/Behavioral:  Positive for dysphoric  mood and sleep disturbance. Negative for decreased concentration, hallucinations, self-injury and suicidal ideas. The patient is nervous/anxious. The patient is not hyperactive.     Last menstrual period 01/20/2019.There is no height or weight on file to calculate BMI.  General Appearance: Casual  Eye Contact:  Good  Speech:  Clear and  Coherent and Normal Rate  Volume:  Normal  Mood:  Anxious and Depressed  Affect:  Congruent and Tearful  Thought Process:  Coherent, Goal Directed, and Descriptions of Associations: Intact  Orientation:  Full (Time, Place, and Person)  Thought Content: WDL   Suicidal Thoughts:  No  Homicidal Thoughts:  No  Memory:  Immediate;   Good Recent;   Good Remote;   Good  Judgement:  Good  Insight:  Good  Psychomotor Activity:  Normal  Concentration:  Concentration: Good and Attention Span: Good  Recall:  Good  Fund of Knowledge: Good  Language: Good  Akathisia:  No  Handed:  Right  AIMS (if indicated): not done  Assets:  Communication Skills Desire for Improvement Housing Social Support Transportation  ADL's:  Intact  Cognition: WNL  Sleep:  Fair   Screenings: GAD-7    Flowsheet Row Video Visit from 01/12/2024 in [facility] Video Visit from 11/02/2023 in [facility] Video Visit from 08/31/2023 in [facility] Office Visit from 04/28/2023 in [facility] Counselor from 04/12/2023 in [facility]  Total GAD-7 Score 19 6 4 12 14    PHQ2-9    Flowsheet Row Video Visit from 01/12/2024 in [facility] Video Visit from 11/02/2023 in [facility] Video Visit from 08/31/2023 in [facility] Office Visit from 04/28/2023 in [facility] Counselor from 04/12/2023 in [facility]  PHQ-2 Total  Score 6 2 1 3 3   PHQ-9 Total Score 15 9 -- 14 17   Flowsheet Row Video Visit from 01/12/2024 in [facility] Video Visit from 11/02/2023 in [facility] Video Visit from 08/31/2023 in [facility]  C-SSRS RISK CATEGORY No Risk No Risk No Risk     Assessment and Plan:   Jennifer Forbes is a 53 year old female with a past psychiatric history significant for major depressive disorder (recurrent episode, moderate) and acute stress disorder who presents to [facility] via virtual video visit for follow-up and medication management.  Patient presents to the encounter stating that she has been experiencing worsening depression and anxiety attributed to external factors.  Patient reports that her home that she is currently living in has been recently foreclosed.  Since the foreclosure, patient states that her sertraline  has not been helpful at all.  A PHQ-9 screen was performed with the patient scoring a 15.  A GAD-7 screen was also performed with the patient scoring a 19.  Provider recommended adjusting her sertraline  from 100 mg to 150 mg for the management of her depressive symptoms and anxiety. Patient was agreeable to recommendation.  Patient's medication to be e-prescribed to pharmacy of choice.  A [place] Suicide Severity Rating Scale was performed with the patient being considered no risk.  Patient denies suicidal ideations and is able to contract for safety following the conclusion of the encounter.  Collaboration of Care: Collaboration of Care: Medication Management AEB provider managing patient's psychiatric medication, Primary Care Provider AEB patient being seen by a primary care provider (internal medicine), Psychiatrist AEB patient being seen by a psychiatric provider at this facility, Other provider involved in patient's care AEB patient being seen by neurology, and  Referral or follow-up with counselor/therapist AEB patient being seen by licensed medical social worker at this facility  Patient/Guardian was  advised Release of Information must be obtained prior to any record release in order to collaborate their care with an outside provider. Patient/Guardian was advised if they have not already done so to contact the registration department to sign all necessary forms in order for us  to release information regarding their care.   Consent: Patient/Guardian gives verbal consent for treatment and assignment of benefits for services provided during this visit. Patient/Guardian expressed understanding and agreed to proceed.   1. Major depressive disorder, recurrent episode, moderate (HCC)  - sertraline  (ZOLOFT ) 50 MG tablet; Take 3 tablets (150 mg total) by mouth daily.  Dispense: 90 tablet; Refill: 1  2. Acute stress disorder  - sertraline  (ZOLOFT ) 50 MG tablet; Take 3 tablets (150 mg total) by mouth daily.  Dispense: 90 tablet; Refill: 1  Patient to follow up in 6 weeks Provider spent a total of 12 minutes with the patient/reviewing patient's chart  Jennifer Forbes Bolster, PA 01/12/2024, 10:15 PM

## 2024-02-15 DIAGNOSIS — R32 Unspecified urinary incontinence: Secondary | ICD-10-CM | POA: Diagnosis not present

## 2024-02-23 ENCOUNTER — Telehealth (HOSPITAL_COMMUNITY): Admitting: Physician Assistant

## 2024-02-23 ENCOUNTER — Encounter (HOSPITAL_COMMUNITY): Payer: Self-pay

## 2024-03-26 DIAGNOSIS — R32 Unspecified urinary incontinence: Secondary | ICD-10-CM | POA: Diagnosis not present

## 2024-03-27 ENCOUNTER — Other Ambulatory Visit: Payer: Self-pay | Admitting: Medical Genetics

## 2024-03-27 DIAGNOSIS — Z006 Encounter for examination for normal comparison and control in clinical research program: Secondary | ICD-10-CM

## 2024-04-09 ENCOUNTER — Encounter: Payer: Self-pay | Admitting: Radiology

## 2024-05-07 ENCOUNTER — Other Ambulatory Visit: Payer: Self-pay | Admitting: Nurse Practitioner

## 2024-05-07 DIAGNOSIS — R000 Tachycardia, unspecified: Secondary | ICD-10-CM

## 2024-05-07 DIAGNOSIS — I10 Essential (primary) hypertension: Secondary | ICD-10-CM

## 2024-05-08 ENCOUNTER — Telehealth: Admitting: Physician Assistant

## 2024-05-08 DIAGNOSIS — J069 Acute upper respiratory infection, unspecified: Secondary | ICD-10-CM | POA: Diagnosis not present

## 2024-05-08 DIAGNOSIS — B9689 Other specified bacterial agents as the cause of diseases classified elsewhere: Secondary | ICD-10-CM

## 2024-05-08 MED ORDER — BENZONATATE 100 MG PO CAPS: 100.0000 mg | ORAL_CAPSULE | Freq: Three times a day (TID) | ORAL | 0 refills | Status: AC | PRN

## 2024-05-08 MED ORDER — DOXYCYCLINE HYCLATE 100 MG PO TABS: 100.0000 mg | ORAL_TABLET | Freq: Two times a day (BID) | ORAL | 0 refills | Status: AC

## 2024-05-08 MED ORDER — IPRATROPIUM BROMIDE 0.03 % NA SOLN: 2.0000 | Freq: Two times a day (BID) | NASAL | 0 refills | Status: AC

## 2024-05-08 NOTE — Progress Notes (Signed)
 Virtual Visit Consent   Jennifer Forbes, you are scheduled for a virtual visit with a [facility] provider today. Just as with appointments in the office, your consent must be obtained to participate. Your consent will be active for this visit and any virtual visit you may have with one of our providers in the next 365 days. If you have a MyChart account, a copy of this consent can be sent to you electronically.  As this is a virtual visit, video technology does not allow for your provider to perform a traditional examination. This may limit your provider's ability to fully assess your condition. If your provider identifies any concerns that need to be evaluated in person or the need to arrange testing (such as labs, EKG, etc.), we will make arrangements to do so. Although advances in technology are sophisticated, we cannot ensure that it will always work on either your end or our end. If the connection with a video visit is poor, the visit may have to be switched to a telephone visit. With either a video or telephone visit, we are not always able to ensure that we have a secure connection.  By engaging in this virtual visit, you consent to the provision of healthcare and authorize for your insurance to be billed (if applicable) for the services provided during this visit. Depending on your insurance coverage, you may receive a charge related to this service.  I need to obtain your verbal consent now. Are you willing to proceed with your visit today? Jennifer Forbes has provided verbal consent on 05/08/2024 for a virtual visit (video or telephone). Jennifer Forbes, [place]  Date: 05/08/2024 10:47 AM   Virtual Visit via Video Note   I, Jennifer Forbes, connected with  Jennifer Forbes  ([id number], 17-Jan-1971) on 05/08/24 at 10:30 AM EST by a video-enabled telemedicine application and verified that I am speaking with the correct person using two identifiers.  Location: Patient: Virtual Visit  Location Patient: Home Provider: Virtual Visit Location Provider: Home Office   I discussed the limitations of evaluation and management by telemedicine and the availability of in person appointments. The patient expressed understanding and agreed to proceed.    History of Present Illness: Jennifer Forbes is a 53 y.o. who identifies as a female who was assigned female at birth, and is being seen today for some ongoing and progressive URI symptoms.  Saturday before  thanksgiving -- sore throat, nasal congestion and drainage that continued throughout the week with initiation of chest congestion, cough and voice hoarseness. Substantial nausea with a couple of episodes of vomiting. Denies facial pain but some sinus pressure/pain still. Headache that is persistent. Cough is productive of colored phlegm now.  OTC - Dayquil/Nyquil, Mucinex , Vicks Vap-o-rub on neck and feet.  HPI: HPI  Problems:  Patient Active Problem List   Diagnosis Date Noted   Acute stress disorder 04/12/2023   Major depressive disorder, recurrent episode, moderate (HCC) 04/12/2023   Loud snoring 11/04/2022   Status post total thyroidectomy 09/21/2022   Dysphagia 08/22/2022   Multinodular goiter (nontoxic) 01/15/2022   Abnormal EKG 05/09/2017   Tachycardia 05/09/2017   Essential hypertension 10/24/2015   Rheumatoid arthritis involving left knee (HCC) 10/24/2015   Gastroesophageal reflux disease without esophagitis 10/24/2015   Tobacco dependence 10/24/2015   Morbid obesity (HCC) 10/24/2015    Allergies:  Allergies  Allergen Reactions   Amoxicillin Rash   Medications:  Current Outpatient Medications:    benzonatate  (TESSALON ) 100  MG capsule, Take 1 capsule (100 mg total) by mouth 3 (three) times daily as needed for cough., Disp: 30 capsule, Rfl: 0   doxycycline  (VIBRA -TABS) 100 MG tablet, Take 1 tablet (100 mg total) by mouth 2 (two) times daily., Disp: 14 tablet, Rfl: 0   ipratropium (ATROVENT) 0.03 % nasal spray,  Place 2 sprays into both nostrils every 12 (twelve) hours., Disp: 30 mL, Rfl: 0   amLODipine  (NORVASC ) 5 MG tablet, Take 1 tablet by mouth once daily, Disp: 90 tablet, Rfl: 0   Aspirin-Salicylamide-Caffeine (BC HEADACHE POWDER PO), Take 1-2 packets by mouth every 4 (four) hours as needed (migraines)., Disp: , Rfl:    cyclobenzaprine  (FLEXERIL ) 10 MG tablet, Take 1 tablet (10 mg total) by mouth 3 (three) times daily as needed for muscle spasms., Disp: 30 tablet, Rfl: 0   levothyroxine  (SYNTHROID ) 125 MCG tablet, Take 1 tablet (125 mcg total) by mouth daily., Disp: 30 tablet, Rfl: 11   metoprolol  succinate (TOPROL -XL) 25 MG 24 hr tablet, Take 1/2 (one-half) tablet by mouth once daily, Disp: 45 tablet, Rfl: 0   pantoprazole  (PROTONIX ) 40 MG tablet, Take 1 tablet (40 mg total) by mouth daily., Disp: 90 tablet, Rfl: 3   rosuvastatin  (CRESTOR ) 10 MG tablet, Take 1 tablet (10 mg total) by mouth daily., Disp: 30 tablet, Rfl: 11   sertraline  (ZOLOFT ) 50 MG tablet, Take 3 tablets (150 mg total) by mouth daily., Disp: 90 tablet, Rfl: 1   SUMAtriptan  (IMITREX ) 100 MG tablet, Take 1 tablet (100 mg total) by mouth once as needed for up to 1 dose for migraine. May repeat in 2 hours if headache persists or recurs., Disp: 10 tablet, Rfl: 5   topiramate  (TOPAMAX ) 25 MG tablet, Take 1 tablet (25 mg total) by mouth daily., Disp: 30 tablet, Rfl: 1  Observations/Objective: Patient is well-developed, well-nourished in no acute distress.  Resting comfortably  at home.  Head is normocephalic, atraumatic.  No labored breathing.  Speech is clear and coherent with logical content.  Patient is alert and oriented at baseline.   Assessment and Plan: 1. Bacterial URI (Primary) - benzonatate  (TESSALON ) 100 MG capsule; Take 1 capsule (100 mg total) by mouth 3 (three) times daily as needed for cough.  Dispense: 30 capsule; Refill: 0 - ipratropium (ATROVENT) 0.03 % nasal spray; Place 2 sprays into both nostrils every 12 (twelve)  hours.  Dispense: 30 mL; Refill: 0 - doxycycline  (VIBRA -TABS) 100 MG tablet; Take 1 tablet (100 mg total) by mouth 2 (two) times daily.  Dispense: 14 tablet; Refill: 0  Rx Doxycycline .  Increase fluids.  Rest.  Saline nasal spray.  Probiotic.  Mucinex  as directed.  Humidifier in bedroom. Tessalon  and Atrovent spray as directed.SABRA In-person evaluation for any non-resolving, new or worsening symptoms despite treatment.   Follow Up Instructions: I discussed the assessment and treatment plan with the patient. The patient was provided an opportunity to ask questions and all were answered. The patient agreed with the plan and demonstrated an understanding of the instructions.  A copy of instructions were sent to the patient via MyChart unless otherwise noted below.   The patient was advised to call back or seek an in-person evaluation if the symptoms worsen or if the condition fails to improve as anticipated.    Jennifer Velma Lunger, PA-C

## 2024-05-08 NOTE — Patient Instructions (Addendum)
 Corinthia CHRISTELLA Moats, thank you for joining [name], PA-C for today's virtual visit.  While this provider is not your primary care provider (PCP), if your PCP is located in our provider database this encounter information will be shared with them immediately following your visit.   A [facility] MyChart account gives you access to today's visit and all your visits, tests, and labs performed at [facility]  click here if you don't have a [facility] MyChart account or go to [email or web address]  Consent: (Patient) Corinthia CHRISTELLA Moats provided verbal consent for this virtual visit at the beginning of the encounter.  Current Medications:  Current Outpatient Medications:    amLODipine  (NORVASC ) 5 MG tablet, Take 1 tablet by mouth once daily, Disp: 90 tablet, Rfl: 0   Aspirin-Salicylamide-Caffeine (BC HEADACHE POWDER PO), Take 1-2 packets by mouth every 4 (four) hours as needed (migraines)., Disp: , Rfl:    ciclopirox  (LOPROX ) 0.77 % cream, Apply topically 2 (two) times daily. (Patient not taking: Reported on 05/27/2023), Disp: 15 g, Rfl: 0   cyclobenzaprine  (FLEXERIL ) 10 MG tablet, Take 1 tablet (10 mg total) by mouth 3 (three) times daily as needed for muscle spasms., Disp: 30 tablet, Rfl: 0   diclofenac  (VOLTAREN ) 75 MG EC tablet, Take 1 tablet (75 mg total) by mouth 2 (two) times daily. (Patient not taking: Reported on 05/27/2023), Disp: 30 tablet, Rfl: 0   levothyroxine  (SYNTHROID ) 125 MCG tablet, Take 1 tablet (125 mcg total) by mouth daily., Disp: 30 tablet, Rfl: 11   meclizine  (ANTIVERT ) 12.5 MG tablet, Take 1 tablet (12.5 mg total) by mouth 3 (three) times daily as needed for dizziness. (Patient not taking: Reported on 05/27/2023), Disp: 30 tablet, Rfl: 0   metoprolol  succinate (TOPROL -XL) 25 MG 24 hr tablet, Take 1/2 (one-half) tablet by mouth once daily, Disp: 45 tablet, Rfl: 0   ondansetron  (ZOFRAN -ODT) 4 MG disintegrating tablet, Take 1 tablet (4 mg total) by mouth  every 8 (eight) hours as needed for nausea or vomiting. (Patient not taking: Reported on 05/27/2023), Disp: 20 tablet, Rfl: 0   pantoprazole  (PROTONIX ) 40 MG tablet, Take 1 tablet (40 mg total) by mouth daily., Disp: 90 tablet, Rfl: 3   rosuvastatin  (CRESTOR ) 10 MG tablet, Take 1 tablet (10 mg total) by mouth daily., Disp: 30 tablet, Rfl: 11   sertraline  (ZOLOFT ) 50 MG tablet, Take 3 tablets (150 mg total) by mouth daily., Disp: 90 tablet, Rfl: 1   SUMAtriptan  (IMITREX ) 100 MG tablet, Take 1 tablet (100 mg total) by mouth once as needed for up to 1 dose for migraine. May repeat in 2 hours if headache persists or recurs., Disp: 10 tablet, Rfl: 5   topiramate  (TOPAMAX ) 25 MG tablet, Take 1 tablet (25 mg total) by mouth daily., Disp: 30 tablet, Rfl: 1   Medications ordered in this encounter:  No orders of the defined types were placed in this encounter.    *If you need refills on other medications prior to your next appointment, please contact your pharmacy*  Follow-Up: Call back or seek an in-person evaluation if the symptoms worsen or if the condition fails to improve as anticipated.   Virtual Care [phone]  Other Instructions Please hydrate and rest. If you have a humidifier please run it in the bedroom at night. Only take plain Mucinex . Take the prescribed medications as directed. If you note any non-resolving, new, or worsening symptoms despite treatment, please seek an in-person evaluation ASAP.    If you have  been instructed to have an in-person evaluation today at a local Urgent Care facility, please use the link below. It will take you to a list of all of our available [facility], including address, phone number and hours of operation. Please do not delay care.  [facility]  If you or a family member do not have a primary care provider, use the link below to schedule a visit and establish care. When you choose a [facility] primary care  physician or advanced practice provider, you gain a long-term partner in health. Find a Primary Care Provider  Learn more about [facility]'s in-office and virtual care options: [facility] Get Care Now

## 2024-06-02 ENCOUNTER — Other Ambulatory Visit: Payer: Self-pay | Admitting: Nurse Practitioner

## 2024-06-02 DIAGNOSIS — G43109 Migraine with aura, not intractable, without status migrainosus: Secondary | ICD-10-CM

## 2024-06-04 NOTE — Telephone Encounter (Signed)
 topiramate  (TOPAMAX ) 25 MG tablet [[id number]]

## 2024-06-05 NOTE — Telephone Encounter (Signed)
 Please advise [facility]

## 2024-07-05 ENCOUNTER — Other Ambulatory Visit: Payer: Self-pay | Admitting: Nurse Practitioner

## 2024-07-05 DIAGNOSIS — G43109 Migraine with aura, not intractable, without status migrainosus: Secondary | ICD-10-CM

## 2024-07-05 NOTE — Telephone Encounter (Signed)
 topiramate  (TOPAMAX ) 25 MG tablet [Pharmacy Med Name: Topiramate  25 MG Oral Tablet]

## 2024-07-05 NOTE — Telephone Encounter (Signed)
 Please advise [facility]
